# Patient Record
Sex: Male | Born: 1950 | Race: White | Hispanic: No | Marital: Married | Smoking: Former smoker
Health system: Southern US, Community
[De-identification: ages and names within clinical notes are randomized; demographics above are authoritative.]

## PROBLEM LIST (undated history)

## (undated) DIAGNOSIS — Z955 Presence of coronary angioplasty implant and graft: Secondary | ICD-10-CM

## (undated) DIAGNOSIS — Z87442 Personal history of urinary calculi: Secondary | ICD-10-CM

## (undated) DIAGNOSIS — Z973 Presence of spectacles and contact lenses: Secondary | ICD-10-CM

## (undated) DIAGNOSIS — N400 Enlarged prostate without lower urinary tract symptoms: Secondary | ICD-10-CM

## (undated) DIAGNOSIS — R350 Frequency of micturition: Secondary | ICD-10-CM

## (undated) DIAGNOSIS — R0600 Dyspnea, unspecified: Secondary | ICD-10-CM

## (undated) DIAGNOSIS — I10 Essential (primary) hypertension: Secondary | ICD-10-CM

## (undated) DIAGNOSIS — R351 Nocturia: Secondary | ICD-10-CM

## (undated) DIAGNOSIS — I255 Ischemic cardiomyopathy: Secondary | ICD-10-CM

## (undated) DIAGNOSIS — Z87448 Personal history of other diseases of urinary system: Secondary | ICD-10-CM

## (undated) DIAGNOSIS — I2510 Atherosclerotic heart disease of native coronary artery without angina pectoris: Secondary | ICD-10-CM

## (undated) DIAGNOSIS — J189 Pneumonia, unspecified organism: Secondary | ICD-10-CM

## (undated) DIAGNOSIS — K219 Gastro-esophageal reflux disease without esophagitis: Secondary | ICD-10-CM

## (undated) DIAGNOSIS — D649 Anemia, unspecified: Secondary | ICD-10-CM

## (undated) HISTORY — DX: Ischemic cardiomyopathy: I25.5

---
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
## 2000-02-24 ENCOUNTER — Encounter: Payer: Self-pay | Admitting: Emergency Medicine

## 2000-02-24 ENCOUNTER — Observation Stay (HOSPITAL_COMMUNITY): Admission: EM | Admit: 2000-02-24 | Discharge: 2000-02-24 | Payer: Self-pay | Admitting: Emergency Medicine

## 2000-02-24 HISTORY — PX: OTHER SURGICAL HISTORY: SHX169

## 2000-08-06 ENCOUNTER — Encounter: Payer: Self-pay | Admitting: Orthopedic Surgery

## 2000-08-06 ENCOUNTER — Ambulatory Visit (HOSPITAL_COMMUNITY): Admission: RE | Admit: 2000-08-06 | Discharge: 2000-08-06 | Payer: Self-pay | Admitting: Orthopedic Surgery

## 2000-09-19 HISTORY — PX: LUMBAR DISC SURGERY: SHX700

## 2000-09-23 ENCOUNTER — Encounter: Payer: Self-pay | Admitting: Internal Medicine

## 2000-09-23 DIAGNOSIS — K2100 Gastro-esophageal reflux disease with esophagitis, without bleeding: Secondary | ICD-10-CM | POA: Insufficient documentation

## 2000-09-23 DIAGNOSIS — K449 Diaphragmatic hernia without obstruction or gangrene: Secondary | ICD-10-CM | POA: Insufficient documentation

## 2000-09-23 DIAGNOSIS — K2900 Acute gastritis without bleeding: Secondary | ICD-10-CM | POA: Insufficient documentation

## 2000-09-23 DIAGNOSIS — K5730 Diverticulosis of large intestine without perforation or abscess without bleeding: Secondary | ICD-10-CM | POA: Insufficient documentation

## 2000-09-23 DIAGNOSIS — K219 Gastro-esophageal reflux disease without esophagitis: Secondary | ICD-10-CM | POA: Insufficient documentation

## 2000-09-23 DIAGNOSIS — K648 Other hemorrhoids: Secondary | ICD-10-CM | POA: Insufficient documentation

## 2000-09-23 DIAGNOSIS — K222 Esophageal obstruction: Secondary | ICD-10-CM | POA: Insufficient documentation

## 2000-09-23 DIAGNOSIS — K2980 Duodenitis without bleeding: Secondary | ICD-10-CM | POA: Insufficient documentation

## 2000-09-27 ENCOUNTER — Ambulatory Visit (HOSPITAL_COMMUNITY): Admission: RE | Admit: 2000-09-27 | Discharge: 2000-09-27 | Payer: Self-pay | Admitting: Internal Medicine

## 2000-09-27 ENCOUNTER — Encounter: Payer: Self-pay | Admitting: Internal Medicine

## 2000-09-29 ENCOUNTER — Ambulatory Visit (HOSPITAL_COMMUNITY): Admission: RE | Admit: 2000-09-29 | Discharge: 2000-09-30 | Payer: Self-pay | Admitting: Neurosurgery

## 2007-06-15 ENCOUNTER — Ambulatory Visit: Payer: Self-pay | Admitting: Internal Medicine

## 2008-01-05 DIAGNOSIS — Z87898 Personal history of other specified conditions: Secondary | ICD-10-CM | POA: Insufficient documentation

## 2009-02-15 ENCOUNTER — Ambulatory Visit (HOSPITAL_BASED_OUTPATIENT_CLINIC_OR_DEPARTMENT_OTHER): Admission: RE | Admit: 2009-02-15 | Discharge: 2009-02-15 | Payer: Self-pay | Admitting: Urology

## 2009-02-15 HISTORY — PX: OTHER SURGICAL HISTORY: SHX169

## 2009-02-27 ENCOUNTER — Emergency Department (HOSPITAL_COMMUNITY): Admission: EM | Admit: 2009-02-27 | Discharge: 2009-02-28 | Payer: Self-pay | Admitting: Emergency Medicine

## 2011-01-20 LAB — CULTURE, BLOOD (ROUTINE X 2): Culture: NO GROWTH

## 2011-01-20 LAB — DIFFERENTIAL
Basophils Absolute: 0 10*3/uL (ref 0.0–0.1)
Basophils Relative: 0 % (ref 0–1)
Eosinophils Relative: 1 % (ref 0–5)
Lymphocytes Relative: 7 % — ABNORMAL LOW (ref 12–46)
Monocytes Absolute: 0 10*3/uL — ABNORMAL LOW (ref 0.1–1.0)
Monocytes Relative: 1 % — ABNORMAL LOW (ref 3–12)
Neutro Abs: 4.8 10*3/uL (ref 1.7–7.7)

## 2011-01-20 LAB — URINALYSIS, ROUTINE W REFLEX MICROSCOPIC
Bilirubin Urine: NEGATIVE
Glucose, UA: NEGATIVE mg/dL
Nitrite: NEGATIVE
Protein, ur: 100 mg/dL — AB
Specific Gravity, Urine: 1.027 (ref 1.005–1.030)
Urobilinogen, UA: 0.2 mg/dL (ref 0.0–1.0)
pH: 5.5 (ref 5.0–8.0)

## 2011-01-20 LAB — BASIC METABOLIC PANEL
CO2: 23 mEq/L (ref 19–32)
Calcium: 9.1 mg/dL (ref 8.4–10.5)
GFR calc Af Amer: >60 mL/min (ref >60)
Glucose, Bld: 126 mg/dL — ABNORMAL HIGH (ref 70–99)
Potassium: 3.4 mEq/L — ABNORMAL LOW (ref 3.5–5.1)
Sodium: 139 mEq/L (ref 135–145)

## 2011-01-20 LAB — CBC
HCT: 40.6 % (ref 39.0–52.0)
Hemoglobin: 14.1 g/dL (ref 13.0–17.0)
MCHC: 34.8 g/dL (ref 30.0–36.0)
RBC: 4.37 MIL/uL (ref 4.22–5.81)
RDW: 12.1 % (ref 11.5–15.5)

## 2011-01-20 LAB — URINE MICROSCOPIC-ADD ON

## 2011-01-20 LAB — URINE CULTURE

## 2011-02-24 NOTE — Op Note (Signed)
NAMEBRANNON, Henry [name]                ACCOUNT NO.:  [id number]   MEDICAL RECORD NO.:  [id number]          PATIENT TYPE:  AMB   LOCATION:  [facility]                         FACILITY:  [facility]   PHYSICIAN:  Maretta Bees. Vonita Moss, M.D.DATE OF BIRTH:  20-Nov-1950   DATE OF PROCEDURE:  02/15/2009  DATE OF DISCHARGE:                               OPERATIVE REPORT   PREOPERATIVE DIAGNOSIS:  Large distal left ureteral stone and second  lower left ureteral stone.   POSTOPERATIVE DIAGNOSIS:  Left ureteral stone and large bladder stone.   PROCEDURE:  1. Cystoscopy.  2. Left ureteroscopy with laser and stone basketing.  3. Left retrograde pyelogram with interpretation.  4. Insertion of left double-J catheter.  5. Laser fragmentation of large bladder stone.   SURGEON:  Maretta Bees. Vonita Moss, M.D.   ANESTHESIA:  General.   INDICATIONS:  This 60 year old gentleman has had gross hematuria and  bladder outlet obstructive symptoms with nocturia and a weak stream.  A  CT scan was performed and it showed what appeared to be an 11 x 7 mm  stone in the distal left ureter and possibly a ureterocele and also 4 x  6 mm stone in the distal left ureter above that.  He is brought to the  OR today for cystoscopy to complete his hematuria workup and to remove  these ureteral stones.   PROCEDURE:  The patient was brought to the operating room and placed in  lithotomy position.  External genitalia were prepped and draped in usual  fashion.  He was cystoscoped.  The anterior urethra was normal.  He had  bilobar hypertrophy and a median lobe and the bladder had trabeculation.  The 7 x 11 mm stone was actually in the bladder at this point.  Fluoroscopically I could still see the 4 x 6 mm stone in the distal left  ureter.  I placed a Glidewire up the left ureter beyond the stone.  Over  the Glidewire I dilated the intramural ureter with the inner core of a  ureteral access sheath.  Through that inner core I inserted a metal  retractable core guidewire to the kidney.  I then used the 6-French  rigid ureteroscope and identified the stone in the distal ureter.  There  was a lot of edema and spasm where the stone had been.  The stone was  too big to remove intact so I used the holmium laser to fragment it into  four pieces, each of which were then removed with the stone basket.  There was no sign of ureteral perforation, but there was mucosal edema  and inflammation.  At this point I reinserted the cystoscope and using  the holmium laser fragment the large stone that was noted to be in the  bladder.  All stone fragments were then removed from the bladder.   I then reinserted the cystoscope and backloaded the guidewire and  performed a left retrograde pyelogram that showed moderate  pyelocaliectasis.  I then inserted a 6-French 26 cm double-J catheter  with a full coil in the upper calix of the left kidney and  a full coil  in the bladder.  The string was left on the double-J catheter and  brought out per  penis and taped to the dorsal aspect of the penis.  He was then taken to  recovery room in good condition having tolerated the procedure well.   I gave the stone fragments to his wife.  Blood loss was minimal.      Maretta Bees. Vonita Moss, M.D.  Electronically Signed     LJP/MEDQ  D:  02/15/2009  T:  02/15/2009  Job:  [id number]

## 2011-02-24 NOTE — Assessment & Plan Note (Signed)
[name]                         GASTROENTEROLOGY OFFICE NOTE   NAME:[name], [name]                       MRN:          [id number]  DATE:06/15/2007                            DOB:          03/23/1951    REASON FOR CONSULTATION:  1. Gastroesophageal reflux disease.  2. Dysphagia.  3. Rectal bleeding.   HISTORY:  This is a 60 year old white male with gastroesophageal reflux  disease complicated by erosive esophagitis and peptic stricture. He is  referred through the courtesy of Dr.  Doristine [name] regarding the above listed  issues. The patient was initially evaluated in this office September 20, 2000, for hemoccult positive stool and reflux disease with dysphagia.  See that dictation for details. He subsequently underwent colonoscopy  and upper endoscopy September 23, 2000. Colonoscopy was carried out to  the ascending colon due to a poor prep. Left-sided diverticulosis was  noted. Hemorrhoids were seen. It was recommended that he have an air  contrast barium enema. He failed to follow through with that  recommendation. Upper endoscopy revealed erosive esophagitis and a  peptic stricture as well as a hiatal hernia, gastritis and duodenitis.  Testing for Helicobacter pylori was negative. He was placed on a proton  pump inhibitor and recommended there is have followup endoscopy with  esophageal dilatation in 4-6 weeks. He failed to follow that  recommendation stating that he was feeling better. He has not been seen  since. Since that time, the patient has developed obstructive urinary  symptoms due to an enlarged prostate for which he is on Flomax. He has  also had back surgery. Otherwise, he has been well. The patient has been  maintained on Prilosec over-the-[name] daily for his reflux symptoms.  He uses this medication due to cost effectiveness. It had been working  well until recent weeks when he has noticed some breakthrough heartburn  as well as  recurrent dysphagia to solids such as meats. He saw Dr.  Doristine [name] and was placed on Zegerid. He has been on that for 3 or 4 weeks.  He is not certain that Prilosec did not work better. He has also noticed  some intermittent rectal bleeding as manifested by red blood on the  tissue particularly noticeable after passing a strained or constipated  stool. No abdominal pain, nausea or vomiting or weight loss.   PAST MEDICAL HISTORY:  1. Gastroesophageal reflux disease.  2. Benign prostatic hypertrophy.  3. Status post right hand surgery.  4. Status post low back surgery.   ALLERGIES:  No known drug allergies.   CURRENT MEDICATIONS:  1. Flomax 0.4 mg two each evening.  2. Zegerid 40 mg at night.   FAMILY HISTORY:  No family history of gastrointestinal malignancy.   SOCIAL HISTORY:  The patient is married with two children and lives with  his wife. Has a GED. He is a Psychiatrist. Does not smoke or use alcohol.   REVIEW OF SYSTEMS:  Per diagnostic evaluation form.   PHYSICAL EXAMINATION:  Well-appearing male in no acute distress. Blood  pressure is 120/82, heart rate 80, weight is 212.8 pounds. He is  5 feet,  11 inches in height.  HEENT: Sclerae anicteric. Conjunctivae are pink. Oral mucosa intact. No  adenopathy.  LUNGS:  Are clear.  HEART: Is regular.  ABDOMEN: Soft without tenderness, mass or hernia. Good bowel sounds  heard.  EXTREMITIES: Are without edema.   LABORATORY DATA:  CBC and comprehensive metabolic panel obtained May 03, 2007, were unremarkable.   IMPRESSION:  1. Gastroesophageal reflux disease with a history of erosive      esophagitis, currently with breakthrough symptoms on Zegerid.  2. Recurrent dysphagia likely due to known peptic stricture.  3. Minor intermittent rectal bleeding. Previous colonoscopy in 2001      with poor prep.   RECOMMENDATIONS:  1. Change to Protonix 40 mg daily.  2. Schedule upper endoscopy with possible esophageal dilation. The       nature of the procedure as well as the risks, benefits and      alternatives have been reviewed. He understood and agreed to      proceed.  3. Schedule colonoscopy with polypectomy if necessary. The nature of      the procedure as well as the risks, benefits and alternatives have      been reviewed. He understood and agreed to proceed.  4. Ongoing general medical care with Dr.  Doristine [name].     Henry [name]. Henry Goodell, MD  Electronically Signed    JNP/MedQ  DD: 06/15/2007  DT: 06/15/2007  Job #: [id number]   cc:   Henry [name], M.D.

## 2011-02-27 NOTE — H&P (Signed)
[place]. [facility]  Patient:    Henry [name], Henry [name]                       MRN: [id number] Adm. Date:  [id number] Attending:  Tressie Stalker D                         History and Physical  CHIEF COMPLAINT:  Left leg pain.  HISTORY OF PRESENT ILLNESS:  The patient is a 60 year old white male who was in his usual state of good health until approximately four months ago.  It was at that time that he began having some low back pain which went on to become severe left leg pain.  He was treated with multiple medications, and failed to improve.  He was subsequently worked up with a lumbar MRI, demonstrating a herniated disk at L4-5 on the left.  The patients signs and symptoms and physical examination were consistent with a left L5 radiculopathy.  He therefore weighed the risks, benefits, and alternatives, and decided to proceed with a microdiskectomy.  PAST MEDICAL HISTORY: 1. Positive for benign prostatic hypertrophy. 2. 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  CURRENT MEDICATIONS: 1. Flomax 8 mg p.o. q.d. 2. Vioxx one p.o. q.d. 3. Tylenol p.r.n. 4. Ibuprofen.  ALLERGIES:  No known drug allergies.  FAMILY HISTORY:  Both of the patients are age 60.  His mother has hypertension.  He has had three back surgeries.  His father has prostate trouble, with prior back surgeries.  SOCIAL HISTORY:  The patient is married.  He has two children.  He lives in [place].  He is self-employed as a Veterinary surgeon.  He denies tobacco, ethanol, or drug use.  REVIEW OF SYSTEMS:  Negative except as above.  PHYSICAL EXAMINATION:  GENERAL:  A pleasant 60 year old white male, who walks with an antalgic gait, complaining of left leg pain.  His height is 6 feet, weight 213 pounds.  HEENT:  Normal.  NECK:  Normal.  THORAX:  Symmetric.  LUNGS:  Clear to auscultation.  HEART:  A regular  rate and rhythm.  ABDOMEN:  Soft, nontender.  EXTREMITIES:  Normal except for the amputation of his right first DIP joint.  BACK:  There is no point tenderness or deformity.  Straight leg raising testing is positive on the left, negative on the left.  Fabere testing negative bilaterally.  NEUROLOGIC:  The patient is alert and oriented x 3.  Cranial nerves II-XII are grossly intact bilaterally.  Vision and hearing grossly normal bilaterally. Motor strength 5/5 in the bilateral deltoid, biceps, triceps, hand grip, wrist extensor, interosseous, psoas, quadriceps, gastrocnemius, extensor hallucis longus.  Cerebellar exam is intact to rapid alternating movements of the upper extremities bilaterally.  Deep tendon reflexes 2/4 in the bilateral biceps, triceps, brachial radialis, quadriceps, gastrocnemius.  He has bilateral flexor plantar reflexes.  No ankle clonus.  Sensory examination normal to light touch and pinprick sensation in all tested dermatomes bilaterally.  IMAGING STUDIES:  The patient had a lumbar MRI performed at [facility] on August 10, 2000, which demonstrates a left-sided herniated nucleus pulposus at L4-5, with compressive left L5 nerve root, and some mild degenerative disease at L5-S1.  ASSESSMENT/PLAN:  L4-5 herniated nucleus pulposus, with degenerative disease, and spinal stenosis, with lumbar radiculopathy.  I have discussed this situation with the patient and with  the patients wife, and have reviewed the MRI scan with them, and pointed out the abnormalities. His signs and symptoms and physical examination are consistent with a left L5 radiculopathy.  Therefore I have discussed the various treatment options with him, including continuing management, and surgery.  I have described the procedure of a left L4-5 microdiskectomy, and have shown him surgical models, and discussed the risks of surgery with him.  The patient has weighed the risks,  benefits, and alternatives to surgery, and wants to proceed with a microdiskectomy on September 29, 2000. DD:  09/29/00 TD:  09/29/00 Job: [id number]

## 2011-02-27 NOTE — Op Note (Signed)
[place]. [facility]  Patient:    Henry [name], Henry [name]                       MRN: [id number] Proc. Date: 09/29/00 Adm. Date:  [id number] Attending:  Tressie Stalker D                           Operative Report  PREOPERATIVE DIAGNOSIS:  Left L4-5 herniated nucleus pulposus, degenerative disk disease, spinal stenosis, lumbar radiculopathy.  POSTOPERATIVE DIAGNOSIS:  Left L4-5 herniated nucleus pulposus, degenerative disk disease, spinal stenosis, lumbar radiculopathy.  OPERATION PERFORMED:  Left L4-5 microdiskectomy using microdissection.  SURGEON:  Cristi Loron, M.D.  ASSISTANT:  Dr. Shirlean Kelly.  ANESTHESIA:  General endotracheal.  ESTIMATED BLOOD LOSS:  100 cc.  SPECIMENS:  None.  DRAINS:  None.  COMPLICATIONS:  None.  INDICATIONS FOR PROCEDURE:  The patient is a 60 year old white male who suffered from back and left leg pain.  He failed medical management and was worked up as an outpatient with a lumbar MRI demonstrated a herniated nucleus pulposus at L4-5 on the left.  His signs, symptoms and physical exam were consistent with a left L5 radiculopathy.  I therefore discussed various treatment options with him including surgery.  He weighed the risks, benefits and alternatives of surgery and decided to proceed with the operation.  DESCRIPTION OF PROCEDURE:  The patient was brought to the operating room by the anesthesia team.  General endotracheal anesthesia was induced. He was then turned to the prone position on the McGregor frame.  His lumbosacral region was then shaved and prepared with Betadine soap and Betadine solution and sterile drapes were applied.  I then injected the areas to be incised with Marcaine with epinephrine solution and used a scalpel to make a midline vertical incision over the L4-5 interspace.  I used electrocautery to dissect down to the thoracolumbar fascia and divided it just to the left of midline performing a  left-sided periosteal dissection.  I stripped the paraspinous musculature from the left spinous process of the lamina of L4 and L5.  I obtained intraoperative radiograph to confirm my location.  I then inserted the [facility] retractor for exposure.  I brought the operating microscope into the field and under its magnification and illumination, I completed the microdissection/decompression.  I used the high speed drill to perform a left L4 laminotomy.  I widened the laminotomy with the Kerrison punch removing the left L4-5 ligamentum flavum, identified the thecal sac and the L5 nerve root. I performed a foraminotomy about the L5 nerve root.  I then freed it up from the epidural tissue and then carefully retracted it medially with the DErrico retractor.  This exposed a moderate sized herniated disk which had migrated caudally towards the neural foramen just ventral to the L5 nerve roots path. I then freed it up with the [facility] 4 and removed it in several fragments with the pituitary forceps.  There was a fairly large hole in the annulus fibrosis and therefore I incised the annulus with a 15 blade scalpel and performed a partial diskectomy in the intervertebral disk space using pituitary forceps and Epstein curets.  I then copiously irrigated the wound with bacitracin solution.  I removed the solution. I palpated about the ventral surface of the thecal sac and the L5 nerve root and noted it was well decompressed throughout the neural foramen.  I used the osteophyte tool  to remove some spondylosis from the vertebral end plates at U0-4.  I then achieved stringent hemostasis with bipolar electrocautery.  I removed the McCullough retractor and reapproximated the patients thoracolumbar fascia with interrupted #1 Vicryl and subcutaneous tissues with interrupted 2-0 Vicryl, the skin with Steri-Strips and benzoin.  The wound was then coated with bacitracin ointment.  Sterile dressing applied.  The  drapes were removed and the patient was subsequently returned to supine position where he was extubated by anesthesia team and transported to the post anesthesia care unit in stable condition.  Sponge, needle and instrument counts were correct at the end of the case. DD:  09/29/00 TD:  09/29/00 Job: [id number]

## 2011-02-27 NOTE — Op Note (Signed)
[place]. [facility]  Patient:    Henry [name], Henry [name]                      MRN: [id number] Proc. Date: 02/24/00 Attending:  Earvin Hansen L. Shon Hough, M.D. CC:         Yaakov Guthrie. Shon Hough, M.D. (2 copies)                           Operative Report  INDICATIONS:  This is a 60 year old gentleman who was at work today and sustained severe lacerations and mangling of his right hand over the ulnar aspect.  The has devitalization and ripping away of tissue from his wrist area up to the small finger, mid phalanx.  X-rays reveal a small ______ fracture of the MP joint area.  On examination preoperatively, the patient demonstrated hypesthesia of the small finger on the ulnar aspect.  He is able to flex his finger and move the superficialis and profundus tendon, and is able to adequately extend the finger.  He is not having any power or agility to manipulate the hypothenar eminence as well.  OPERATION PERFORMED:  Exploration, repair of soft tissue structures.  ATTENDING SURGEON:  Yaakov Guthrie. Shon Hough, M.D.  ANESTHESIA:  General with tourniquet.  Soft tissues are repaired and thenar eminence musculature, which was slit through-and-through longitudinally.  Repair of the capsule of the MP joint and collateral ligaments.  Irrigation.  Microscopic repair of the digital nerve. Repair of severe laceration of the hand after debridement, and flap advancement, and full-thickness skin graft coverage.  DESCRIPTION OF PROCEDURE:  After the patient was given general anesthesia orally, prep was done to the right hand and arm areas as well as the abdomen and thigh areas in preparation for flaps or grafts.  Using Betadine soap and solution, walled off with the sterile towels and drapes so as to make a sterile field, the hand was explored.  Irrigation was done with copious amounts of saline as well as bug juice.  Examination reveals through-and-through laceration longitudinal of a thenar  eminence musculature times about three ______ lacerations.  Examination reveals also through-and-through tear of the collateral ligaments and capsule of the MP joint area with exposed capsule and joint.  Through-and-through laceration of digital nerve, severe laceration jagged of the hypothenar eminence and wrist up to the small finger distal joint.  Debridement was done to the edges of the skin.  Irrigation was done with copious amounts of saline.  Next, the muscle was repaired back in its normal anatomical location with multiple sutures of 3-0 Vicryl, tied with approximately 15 sutures.  The capsule was then repaired after parts of it were dissected out and repaired back with 3-0 Vicryl.  After this, the neurovascular structures are examined showing the tendons to be intact both on the flexor and extensor side.  The digital nerve was isolated both the proximal and distal ends, and then epineural repair was done microscopically 8-0 Prolene.  The wound was irrigated and soft tissue was placed over the digital nerve repair.  Next, the [facility] was used to dissect under skin and dermis to allow the flap to advance over the hypothenar eminence.  Full-thickness skin graft, however, was taken for the lateral part of the MP to the digital area of the skin loss.  Full-thickness skin graft taken from the right groin.  This was then repaired back with 3-0 Vicryl subcu and a running  subcuticular stitch.  The graft was defatted, placed over the defect and secured with 4-0 Prolene sutures.  ______ the suture and left ______ dressing using Xeroform, 2 x 2s, 4 x 4s, ______ dressing, Ace wrap. ______ Steri-Strips and soft dressings.  He withstood the procedures very well and was taken to recovery in excellent condition.  Estimated blood loss: Nil.  ______ tourniquet was used.  He will be discharged today to be seen back in my office in a couple of days for re-examination and dressing change.  He will  be placed on antibiotic therapy as well as analgesia.  He is to call with any medical problems. DD:  02/24/00 TD:  02/27/00 Job: [id number]

## 2013-07-13 ENCOUNTER — Other Ambulatory Visit: Payer: Self-pay | Admitting: Urology

## 2013-09-27 ENCOUNTER — Encounter (HOSPITAL_BASED_OUTPATIENT_CLINIC_OR_DEPARTMENT_OTHER): Payer: Self-pay | Admitting: *Deleted

## 2013-09-28 ENCOUNTER — Encounter (HOSPITAL_BASED_OUTPATIENT_CLINIC_OR_DEPARTMENT_OTHER): Payer: Self-pay | Admitting: *Deleted

## 2013-09-28 NOTE — Progress Notes (Signed)
NPO AFTER MN. ARRIVE AT 0930. NEEDS HG.  PT AWARE OWER AT MAIN.

## 2013-09-29 ENCOUNTER — Ambulatory Visit (HOSPITAL_BASED_OUTPATIENT_CLINIC_OR_DEPARTMENT_OTHER): Payer: BC Managed Care – PPO | Admitting: Anesthesiology

## 2013-09-29 ENCOUNTER — Encounter (HOSPITAL_BASED_OUTPATIENT_CLINIC_OR_DEPARTMENT_OTHER): Payer: Self-pay

## 2013-09-29 ENCOUNTER — Encounter (HOSPITAL_COMMUNITY)
Admission: RE | Disposition: A | Discharge status: Home / Self Care | Payer: Self-pay | Source: Ambulatory Visit | Attending: Urology

## 2013-09-29 ENCOUNTER — Encounter (HOSPITAL_BASED_OUTPATIENT_CLINIC_OR_DEPARTMENT_OTHER): Payer: BC Managed Care – PPO | Admitting: Anesthesiology

## 2013-09-29 ENCOUNTER — Observation Stay (HOSPITAL_BASED_OUTPATIENT_CLINIC_OR_DEPARTMENT_OTHER)
Admission: RE | Admit: 2013-09-29 | Discharge: 2013-09-30 | Disposition: A | Discharge status: Home / Self Care | Payer: BC Managed Care – PPO | Source: Ambulatory Visit | Attending: Urology | Admitting: Urology

## 2013-09-29 DIAGNOSIS — N210 Calculus in bladder: Secondary | ICD-10-CM | POA: Insufficient documentation

## 2013-09-29 DIAGNOSIS — Z87442 Personal history of urinary calculi: Secondary | ICD-10-CM | POA: Diagnosis not present

## 2013-09-29 DIAGNOSIS — Z87891 Personal history of nicotine dependence: Secondary | ICD-10-CM | POA: Diagnosis not present

## 2013-09-29 DIAGNOSIS — K219 Gastro-esophageal reflux disease without esophagitis: Secondary | ICD-10-CM | POA: Diagnosis not present

## 2013-09-29 DIAGNOSIS — Z79899 Other long term (current) drug therapy: Secondary | ICD-10-CM | POA: Diagnosis not present

## 2013-09-29 DIAGNOSIS — N138 Other obstructive and reflux uropathy: Secondary | ICD-10-CM | POA: Diagnosis present

## 2013-09-29 DIAGNOSIS — R232 Flushing: Secondary | ICD-10-CM | POA: Insufficient documentation

## 2013-09-29 DIAGNOSIS — N401 Enlarged prostate with lower urinary tract symptoms: Principal | ICD-10-CM | POA: Diagnosis present

## 2013-09-29 DIAGNOSIS — N320 Bladder-neck obstruction: Secondary | ICD-10-CM | POA: Diagnosis not present

## 2013-09-29 DIAGNOSIS — R300 Dysuria: Secondary | ICD-10-CM | POA: Diagnosis not present

## 2013-09-29 HISTORY — DX: Benign prostatic hyperplasia without lower urinary tract symptoms: N40.0

## 2013-09-29 HISTORY — PX: CYSTOSCOPY WITH LITHOLAPAXY: SHX1425

## 2013-09-29 HISTORY — DX: Personal history of other diseases of urinary system: Z87.448

## 2013-09-29 HISTORY — PX: TRANSURETHRAL RESECTION OF PROSTATE: SHX73

## 2013-09-29 HISTORY — DX: Frequency of micturition: R35.0

## 2013-09-29 HISTORY — DX: Personal history of urinary calculi: Z87.442

## 2013-09-29 HISTORY — DX: Gastro-esophageal reflux disease without esophagitis: K21.9

## 2013-09-29 HISTORY — DX: Nocturia: R35.1

## 2013-09-29 HISTORY — DX: Presence of spectacles and contact lenses: Z97.3

## 2013-09-29 LAB — BASIC METABOLIC PANEL
BUN: 13 mg/dL (ref 6–23)
CO2: 24 mEq/L (ref 19–32)
Chloride: 103 mEq/L (ref 96–112)
GFR calc Af Amer: >90 mL/min (ref >90)
GFR calc non Af Amer: >90 mL/min (ref >90)
Potassium: 3.4 mEq/L — ABNORMAL LOW (ref 3.5–5.1)
Sodium: 137 mEq/L (ref 135–145)

## 2013-09-29 LAB — POCT HEMOGLOBIN-HEMACUE: Hemoglobin: 14.5 g/dL (ref 13.0–17.0)

## 2013-09-29 SURGERY — TRANSURETHRAL RESECTION OF THE PROSTATE WITH GYRUS INSTRUMENTS
Anesthesia: General | Site: Prostate

## 2013-09-29 MED ORDER — FENTANYL CITRATE 0.05 MG/ML IJ SOLN
INTRAMUSCULAR | Status: AC
  Filled 2013-09-29: qty 4

## 2013-09-29 MED ORDER — STERILE WATER FOR IRRIGATION IR SOLN
Status: DC | PRN
  Administered 2013-09-29: 3000 mL

## 2013-09-29 MED ORDER — CIPROFLOXACIN IN D5W 400 MG/200ML IV SOLN
400.0000 mg | INTRAVENOUS | Status: AC
Start: 2013-09-29 — End: 2013-09-29
  Administered 2013-09-29: 400 mg via INTRAVENOUS
  Filled 2013-09-29: qty 200

## 2013-09-29 MED ORDER — PROPOFOL 10 MG/ML IV BOLUS
INTRAVENOUS | Status: DC | PRN
  Administered 2013-09-29: 200 mg via INTRAVENOUS

## 2013-09-29 MED ORDER — SODIUM CHLORIDE 0.45 % IV SOLN
INTRAVENOUS | Status: DC
  Administered 2013-09-29 – 2013-09-30 (×2): via INTRAVENOUS

## 2013-09-29 MED ORDER — FENTANYL CITRATE 0.05 MG/ML IJ SOLN
INTRAMUSCULAR | Status: DC | PRN
  Administered 2013-09-29 (×2): 50 ug via INTRAVENOUS

## 2013-09-29 MED ORDER — ONDANSETRON HCL 4 MG/2ML IJ SOLN
INTRAMUSCULAR | Status: DC | PRN
  Administered 2013-09-29: 4 mg via INTRAVENOUS

## 2013-09-29 MED ORDER — DOCUSATE SODIUM 100 MG PO CAPS
100.0000 mg | ORAL_CAPSULE | Freq: Two times a day (BID) | ORAL | Status: DC
  Administered 2013-09-29 – 2013-09-30 (×2): 100 mg via ORAL
  Filled 2013-09-29 (×3): qty 1

## 2013-09-29 MED ORDER — CIPROFLOXACIN HCL 250 MG PO TABS: 250.0000 mg | ORAL_TABLET | Freq: Two times a day (BID) | ORAL | Status: DC

## 2013-09-29 MED ORDER — BELLADONNA ALKALOIDS-OPIUM 16.2-60 MG RE SUPP
RECTAL | Status: AC
  Filled 2013-09-29: qty 1

## 2013-09-29 MED ORDER — FENTANYL CITRATE 0.05 MG/ML IJ SOLN
INTRAMUSCULAR | Status: AC
  Filled 2013-09-29: qty 2

## 2013-09-29 MED ORDER — BELLADONNA ALKALOIDS-OPIUM 16.2-60 MG RE SUPP
RECTAL | Status: DC | PRN
  Administered 2013-09-29: 1 via RECTAL

## 2013-09-29 MED ORDER — MIDAZOLAM HCL 5 MG/5ML IJ SOLN
INTRAMUSCULAR | Status: DC | PRN
  Administered 2013-09-29: 2 mg via INTRAVENOUS

## 2013-09-29 MED ORDER — SODIUM CHLORIDE 0.9 % IR SOLN
Status: DC | PRN
  Administered 2013-09-29: 11000 mL via INTRAVESICAL

## 2013-09-29 MED ORDER — DEXAMETHASONE SODIUM PHOSPHATE 4 MG/ML IJ SOLN
INTRAMUSCULAR | Status: DC | PRN
  Administered 2013-09-29: 10 mg via INTRAVENOUS

## 2013-09-29 MED ORDER — ZOLPIDEM TARTRATE 5 MG PO TABS: 5.0000 mg | ORAL_TABLET | Freq: Every evening | ORAL | Status: DC | PRN

## 2013-09-29 MED ORDER — LACTATED RINGERS IV SOLN
INTRAVENOUS | Status: DC
  Administered 2013-09-29 (×2): via INTRAVENOUS
  Filled 2013-09-29: qty 1000

## 2013-09-29 MED ORDER — HYDROCODONE-ACETAMINOPHEN 5-325 MG PO TABS: 1.0000 | ORAL_TABLET | ORAL | Status: DC | PRN

## 2013-09-29 MED ORDER — CIPROFLOXACIN HCL 250 MG PO TABS
250.0000 mg | ORAL_TABLET | Freq: Two times a day (BID) | ORAL | Status: AC
Start: 2013-09-29 — End: 2013-09-29
  Administered 2013-09-29: 250 mg via ORAL
  Filled 2013-09-29: qty 1

## 2013-09-29 MED ORDER — LIDOCAINE HCL (CARDIAC) 20 MG/ML IV SOLN
INTRAVENOUS | Status: DC | PRN
  Administered 2013-09-29: 80 mg via INTRAVENOUS

## 2013-09-29 MED ORDER — FENTANYL CITRATE 0.05 MG/ML IJ SOLN
25.0000 ug | INTRAMUSCULAR | Status: DC | PRN
  Administered 2013-09-29: 50 ug via INTRAVENOUS
  Administered 2013-09-29: 25 ug via INTRAVENOUS
  Administered 2013-09-29: 50 ug via INTRAVENOUS
  Filled 2013-09-29: qty 1

## 2013-09-29 MED ORDER — CIPROFLOXACIN IN D5W 400 MG/200ML IV SOLN
INTRAVENOUS | Status: AC
  Filled 2013-09-29: qty 200

## 2013-09-29 MED ORDER — BELLADONNA ALKALOIDS-OPIUM 16.2-60 MG RE SUPP: 1.0000 | Freq: Four times a day (QID) | RECTAL | Status: DC | PRN

## 2013-09-29 MED ORDER — MEPERIDINE HCL 25 MG/ML IJ SOLN
6.2500 mg | INTRAMUSCULAR | Status: DC | PRN
  Filled 2013-09-29: qty 1

## 2013-09-29 MED ORDER — MIDAZOLAM HCL 2 MG/2ML IJ SOLN
INTRAMUSCULAR | Status: AC
  Filled 2013-09-29: qty 2

## 2013-09-29 MED ORDER — LACTATED RINGERS IV SOLN
INTRAVENOUS | Status: DC
  Administered 2013-09-29: 10:00:00 via INTRAVENOUS
  Filled 2013-09-29: qty 1000

## 2013-09-29 MED ORDER — PROMETHAZINE HCL 25 MG/ML IJ SOLN
6.2500 mg | INTRAMUSCULAR | Status: DC | PRN
  Filled 2013-09-29: qty 1

## 2013-09-29 MED ORDER — ONDANSETRON HCL 4 MG/2ML IJ SOLN: 4.0000 mg | INTRAMUSCULAR | Status: DC | PRN

## 2013-09-29 SURGICAL SUPPLY — 44 items
BAG DRAIN URO-CYSTO SKYTR STRL (DRAIN) ×3 IMPLANT
BAG URINE DRAINAGE (UROLOGICAL SUPPLIES) ×3 IMPLANT
BAG URINE LEG 19OZ MD ST LTX (BAG) IMPLANT
CANISTER SUCT LVC 12 LTR MEDI- (MISCELLANEOUS) ×6 IMPLANT
CARTRIDGE STONEBREAK CO2 KIDNE (ELECTROSURGICAL) ×15 IMPLANT
CATH FOLEY 2WAY SLVR  5CC 20FR (CATHETERS)
CATH FOLEY 2WAY SLVR  5CC 22FR (CATHETERS)
CATH FOLEY 2WAY SLVR 30CC 22FR (CATHETERS) IMPLANT
CATH FOLEY 2WAY SLVR 5CC 20FR (CATHETERS) IMPLANT
CATH FOLEY 2WAY SLVR 5CC 22FR (CATHETERS) IMPLANT
CATH FOLEY 3WAY 30CC 22F (CATHETERS) ×3 IMPLANT
CATH HEMA 3WAY 30CC 24FR COUDE (CATHETERS) IMPLANT
CATH HEMA 3WAY 30CC 24FR RND (CATHETERS) IMPLANT
CLOTH BEACON ORANGE TIMEOUT ST (SAFETY) ×3 IMPLANT
DRAPE CAMERA CLOSED 9X96 (DRAPES) ×3 IMPLANT
ELECT BUTTON HF 24-28F 2 30DE (ELECTRODE) ×3 IMPLANT
ELECT LOOP MED HF 24F 12D CBL (CLIP) ×3 IMPLANT
ELECT REM PT RETURN 9FT ADLT (ELECTROSURGICAL) ×3
ELECTRODE REM PT RTRN 9FT ADLT (ELECTROSURGICAL) ×2 IMPLANT
EVACUATOR MICROVAS BLADDER (UROLOGICAL SUPPLIES) ×3 IMPLANT
GLOVE BIO SURGEON STRL SZ 6.5 (GLOVE) ×6 IMPLANT
GLOVE BIO SURGEON STRL SZ8 (GLOVE) ×3 IMPLANT
GLOVE BIOGEL PI IND STRL 6.5 (GLOVE) ×2 IMPLANT
GLOVE BIOGEL PI INDICATOR 6.5 (GLOVE) ×1
GOWN PREVENTION PLUS LG XLONG (DISPOSABLE) ×3 IMPLANT
GOWN STRL REIN XL XLG (GOWN DISPOSABLE) ×3 IMPLANT
HOLDER FOLEY CATH W/STRAP (MISCELLANEOUS) ×3 IMPLANT
IV NS 1000ML (IV SOLUTION) ×5
IV NS 1000ML BAXH (IV SOLUTION) ×10 IMPLANT
IV NS IRRIG 3000ML ARTHROMATIC (IV SOLUTION) ×6 IMPLANT
KIT ASPIRATION TUBING (SET/KITS/TRAYS/PACK) IMPLANT
KIT BALLIN UROMAX 15FX10 (LABEL) IMPLANT
KIT BALLN UROMAX 15FX4 (MISCELLANEOUS) IMPLANT
KIT BALLN UROMAX 26 75X4 (MISCELLANEOUS)
PACK CYSTOSCOPY (CUSTOM PROCEDURE TRAY) ×3 IMPLANT
PLUG CATH AND CAP STER (CATHETERS) IMPLANT
PROBE PNEUMATIC 1.6MM (ELECTROSURGICAL) ×3 IMPLANT
SET ASPIRATION TUBING (TUBING) ×3 IMPLANT
SET HIGH PRES BAL DIL (LABEL)
SHEATH URET ACCESS 12FR/35CM (UROLOGICAL SUPPLIES) IMPLANT
SHEATH URET ACCESS 12FR/55CM (UROLOGICAL SUPPLIES) IMPLANT
SYR 30ML LL (SYRINGE) ×3 IMPLANT
SYRINGE IRR TOOMEY STRL 70CC (SYRINGE) IMPLANT
WATER STERILE IRR 500ML POUR (IV SOLUTION) IMPLANT

## 2013-09-29 NOTE — H&P (Signed)
  Urology History and Physical Exam  CC: Prostatic enlargement, bladder stone  HPI: 62 year old male presents for a TURP and cystolithalopaxy as treatment for obstructive BPH and a 22 mm bladder calculus. He has been on maximal medical therapy and despite that has significant obstructive symptomatology, with an IPSS of 27.  PMH: Past Medical History  Diagnosis Date  . History of kidney stones   . History of bladder stone   . BPH (benign prostatic hypertrophy)   . GERD (gastroesophageal reflux disease)     occasionally uses tums or 1 tbsp vinager  . Nocturia   . Frequency of urination   . Wears glasses     PSH: Past Surgical History  Procedure Laterality Date  . Repair right hand soft tissue structures  02-24-2000    WORK INJURY /  PARTIAL AMPUTATION RIGHT INDEX FINGER  . Lumbar disc surgery  09-19-2000    LEFT  L4 -- L5  . Cysto/  left ureteroscopic stone extraction/  bladder  stone extraction  02-15-2009    Allergies: No Known Allergies  Medications: No prescriptions prior to admission     Social History: History   Social History  . Marital Status: Married    Spouse Name: N/A    Number of Children: N/A  . Years of Education: N/A   Occupational History  . Not on file.   Social History Main Topics  . Smoking status: Former Smoker -- 1.00 packs/day for 20 years    Types: Cigarettes    Quit date: 09/29/1983  . Smokeless tobacco: Never Used  . Alcohol Use: Yes     Comment: rare  . Drug Use: No  . Sexual Activity: Not on file   Other Topics Concern  . Not on file   Social History Narrative  . No narrative on file    Family History: History reviewed. No pertinent family history.  Review of Systems: Positive: Frequency, urgency, decreased FOS, nocturia, intermittency, hesitancy, hematuria, ED. Negative:   A further 10 point review of systems was negative except what is listed in the HPI.  Physical Exam: @VITALS2 @ General: No acute distress.   Awake. Head:  Normocephalic.  Atraumatic. ENT:  EOMI.  Mucous membranes moist Neck:  Supple.  No lymphadenopathy. CV:  S1 present. S2 present. Regular rate. Pulmonary: Equal effort bilaterally.  Clear to auscultation bilaterally. Abdomen: Soft.  Non tender to palpation. Skin:  Normal turgor.  No visible rash. Extremity: No gross deformity of bilateral upper extremities.  No gross deformity of    bilateral lower extremities. Neurologic: Alert. Appropriate mood.    Studies:  No results found for this basename: HGB, WBC, PLT,  in the last 72 hours  No results found for this basename: NA, K, CL, CO2, BUN, CREATININE, CALCIUM, MAGNESIUM, GFRNONAA, GFRAA,  in the last 72 hours   No results found for this basename: PT, INR, APTT,  in the last 72 hours   No components found with this basename: ABG,     Assessment:  BPH, bladder stone  Plan: TUR-P, cystolithalopaxy

## 2013-09-29 NOTE — Anesthesia Procedure Notes (Signed)
Procedure Name: LMA Insertion Date/Time: 09/29/2013 11:25 AM Performed by: Renella Cunas D Pre-anesthesia Checklist: Patient identified, Emergency Drugs available, Suction available and Patient being monitored Patient Re-evaluated:Patient Re-evaluated prior to inductionOxygen Delivery Method: Circle System Utilized Preoxygenation: Pre-oxygenation with 100% oxygen Intubation Type: IV induction Ventilation: Mask ventilation without difficulty LMA: LMA inserted LMA Size: 5.0 Number of attempts: 1 Airway Equipment and Method: bite block Placement Confirmation: positive ETCO2 Tube secured with: Tape Dental Injury: Teeth and Oropharynx as per pre-operative assessment

## 2013-09-29 NOTE — Anesthesia Preprocedure Evaluation (Signed)
Anesthesia Evaluation  Patient identified by MRN, date of birth, ID band Patient awake    Reviewed: Allergy & Precautions, H&P , NPO status , Patient's Chart, lab work & pertinent test results  Airway Mallampati: II TM Distance: >3 FB Neck ROM: Full    Dental no notable dental hx.    Pulmonary neg pulmonary ROS, former smoker,  breath sounds clear to auscultation  Pulmonary exam normal       Cardiovascular negative cardio ROS  Rhythm:Regular Rate:Normal     Neuro/Psych negative neurological ROS  negative psych ROS   GI/Hepatic negative GI ROS, Neg liver ROS, GERD-  Controlled,  Endo/Other  negative endocrine ROS  Renal/GU negative Renal ROS  negative genitourinary   Musculoskeletal negative musculoskeletal ROS (+)   Abdominal   Peds negative pediatric ROS (+)  Hematology negative hematology ROS (+)   Anesthesia Other Findings   Reproductive/Obstetrics negative OB ROS                           Anesthesia Physical Anesthesia Plan  ASA: II  Anesthesia Plan: General   Post-op Pain Management:    Induction: Intravenous  Airway Management Planned: LMA  Additional Equipment:   Intra-op Plan:   Post-operative Plan: Extubation in OR  Informed Consent: I have reviewed the patients History and Physical, chart, labs and discussed the procedure including the risks, benefits and alternatives for the proposed anesthesia with the patient or authorized representative who has indicated his/her understanding and acceptance.   Dental advisory given  Plan Discussed with: CRNA  Anesthesia Plan Comments:         Anesthesia Quick Evaluation

## 2013-09-29 NOTE — Op Note (Signed)
Preoperative diagnosis: 1. Bladder outlet obstruction secondary to BPH 2. 22 mm bladder stone Postoperative diagnosis:  1. Bladder outlet obstruction secondary to BPH 2. 22 mm bladder stone Procedure:  1. Cystoscopy 2. Transurethral resection of the prostate 3. Cystolithalopaxy  Surgeon: Bertram Millard. Henry [name], M.D.  Anesthesia: General  Complications: None  Drain: Foley catheter  EBL: Minimal  Specimens: 1. Prostate chips to path 2. Bladder stones to family  Disposition of specimens: Pathology  Indication: Henry [name] is a patient with bladder outlet obstruction secondary to benign prostatic hyperplasia. He also has a 22 mm bladder stone. After reviewing the management options for treatment, he elected to proceed with the above surgical procedure(s). We have discussed the potential benefits and risks of the procedure, side effects of the proposed treatment, the likelihood of the patient achieving the goals of the procedure, and any potential problems that might occur during the procedure or recuperation. Informed consent has been obtained.  Description of procedure:  The patient was identified in the holding area.He received preoperative antibiotics. He was then taken to the operating room. General anesthetic was administered.  The patient was then placed in the dorsal lithotomy position, prepped and draped in the usual sterile fashion. Timeout was then performed.  A 22 French panendoscope was passed through his urethra and into his bladder. There was no significant obstruction from the lateral lobes. However, there was a significant median bar with obvious obstruction. The bladder was entered and inspected circumferentially. There were 2+ trabeculations scattered throughout. There were no mucosal lesions. Ureteral orifices were normal in configuration and location. The previously mentioned large bladder stone was identified. I then passed the Stonebreaker through the cystoscope,  and using the pneumatic device, fragmented the large stone into multiple smaller fragments. Approximately 6 CO2 cartridges were used for this fragmentation. Following fragmentation, I passed the resectoscope sheath after removing the cystoscope. I used the Microvasive bladder evacuator to remove the stone fragments. Inspection of the bladder revealed adequate evacuation of all stone fragments. At this point, I placed the resectoscope element and cutting loop..  The prostate adenoma was then resected utilizing loop cautery resection with the bipolar cutting loop.  The prostate adenoma from the bladder neck back to the verumontanum was resected beginning at the six o'clock position and then extended to include the right and left lobes of the prostate and anterior prostate, respectively. Care was taken not to resect distal to the verumontanum.  Hemostasis was then achieved with the cautery and the bladder was emptied and reinspected with no significant bleeding noted at the end of the procedure.  Resected chips were irrigated from the bladder with the evacuator and sent to pathology.  A 3 way catheter was then placed into the bladder and placed on continuous bladder irrigation.  The patient appeared to tolerate the procedure well and without complications. The patient was able to be awakened and transferred to the recovery unit in satisfactory condition. He tolerated the procedure well.

## 2013-09-29 NOTE — Transfer of Care (Signed)
Immediate Anesthesia Transfer of Care Note  Patient: Henry [name]  Procedure(s) Performed: Procedure(s) (LRB): TRANSURETHRAL RESECTION OF THE PROSTATE WITH GYRUS INSTRUMENTS (N/A) CYSTOSCOPY WITH LITHOLAPAXY (N/A)  Patient Location: PACU  Anesthesia Type: General  Level of Consciousness: awake, oriented, sedated and patient cooperative  Airway & Oxygen Therapy: Patient Spontanous Breathing and Patient connected to face mask oxygen  Post-op Assessment: Report given to PACU RN and Post -op Vital signs reviewed and stable  Post vital signs: Reviewed and stable  Complications: No apparent anesthesia complications

## 2013-09-30 DIAGNOSIS — N138 Other obstructive and reflux uropathy: Secondary | ICD-10-CM | POA: Diagnosis not present

## 2013-09-30 DIAGNOSIS — N401 Enlarged prostate with lower urinary tract symptoms: Secondary | ICD-10-CM | POA: Diagnosis not present

## 2013-09-30 MED ORDER — CALCIUM CARBONATE ANTACID 500 MG PO CHEW
800.0000 mg | CHEWABLE_TABLET | ORAL | Status: DC | PRN
  Administered 2013-09-30: 800 mg via ORAL
  Filled 2013-09-30: qty 4

## 2013-09-30 NOTE — Progress Notes (Signed)
Utilization Review completed.  

## 2013-09-30 NOTE — Discharge Summary (Signed)
Date of admission: 09/29/2013  Date of discharge: 09/30/2013  Admission diagnosis: bladder outlet obstruction, bladder calculus   Discharge diagnosis: as above  Secondary diagnoses:  Patient Active Problem List   Diagnosis Date Noted  . Hypertrophy of prostate with urinary obstruction and other lower urinary tract symptoms (LUTS) 09/29/2013  . BENIGN PROSTATIC HYPERTROPHY, HX OF 01/05/2008  . INTERNAL HEMORRHOIDS 09/23/2000  . ESOPHAGITIS, REFLUX 09/23/2000  . ESOPHAGEAL STRICTURE 09/23/2000  . GERD 09/23/2000  . GASTRITIS, ACUTE 09/23/2000  . DUODENITIS, WITHOUT HEMORRHAGE 09/23/2000  . HIATAL HERNIA 09/23/2000  . DIVERTICULOSIS, COLON 09/23/2000    History and Physical: For full details, please see admission history and physical. Briefly, Henry [name] is a 62 y.o. year old patient with BPH, obstructive median lobe, bladder calculi.   Hospital Course: Patient tolerated the procedure well.  He was then transferred to the floor after an uneventful PACU stay.  His hospital course was uncomplicated.  On POD#1  he had met discharge criteria: was eating a regular diet, was up and ambulating independently,  pain was well controlled, was voiding without a catheter, and was ready to for discharge.   Laboratory values:   Recent Labs  09/29/13 1019  HGB 14.5    Recent Labs  09/29/13 1614  NA 137  K 3.4*  CL 103  CO2 24  GLUCOSE 194*  BUN 13  CREATININE 0.78  CALCIUM 9.0   No results found for this basename: LABPT, INR,  in the last 72 hours No results found for this basename: LABURIN,  in the last 72 hours Results for orders placed during the hospital encounter of 02/27/09  URINE CULTURE     Status: None   Collection Time    02/28/09 12:06 AM      Result Value Range Status   Specimen Description URINE, RANDOM   Final   Special Requests NONE   Final   Colony Count >=100,000 COLONIES/ML   Final   Culture ESCHERICHIA COLI   Final   Report Status 03/04/2009 FINAL    Final   Organism ID, Bacteria ESCHERICHIA COLI   Final  CULTURE, BLOOD (ROUTINE X 2)     Status: None   Collection Time    02/28/09 12:10 AM      Result Value Range Status   Specimen Description BLOOD RIGHT HAND   Final   Special Requests BOTTLES DRAWN AEROBIC AND ANAEROBIC 2 CC EACH   Final   Culture NO GROWTH 5 DAYS   Final   Report Status 03/06/2009 FINAL   Final    Disposition: Home  Discharge instruction: The patient was instructed to be ambulatory but told to refrain from heavy lifting, strenuous activity, or driving.   Discharge medications:    Medication List    STOP taking these medications       finasteride 5 MG tablet  Commonly known as:  PROSCAR     tamsulosin 0.4 MG Caps capsule  Commonly known as:  FLOMAX      TAKE these medications       calcium carbonate 500 MG chewable tablet  Commonly known as:  TUMS - dosed in mg elemental calcium  Chew 1 tablet by mouth as needed for indigestion or heartburn.     CENTAVITE A-Z COMPLETE-MINERAL Tabs  Take by mouth. vegatable / mineral/ vitamin supplement     ciprofloxacin 250 MG tablet  Commonly known as:  CIPRO  Take 1 tablet (250 mg total) by mouth 2 (two) times daily.  vitamin C 1000 MG tablet  Take 1,000 mg by mouth daily.        Followup:      Follow-up Information   Follow up with Chelsea Aus, MD On 10/31/2013. [phone] w/ Denna Haggard, NP)    Specialty:  Urology   Contact information:   [address] [place] [address] [phone]

## 2013-09-30 NOTE — Anesthesia Postprocedure Evaluation (Signed)
  Anesthesia Post-op Note  Patient: Henry [name]  Procedure(s) Performed: Procedure(s) (LRB): TRANSURETHRAL RESECTION OF THE PROSTATE WITH GYRUS INSTRUMENTS (N/A) CYSTOSCOPY WITH LITHOLAPAXY (N/A)  Patient Location: PACU  Anesthesia Type: General  Level of Consciousness: awake and alert   Airway and Oxygen Therapy: Patient Spontanous Breathing  Post-op Pain: mild  Post-op Assessment: Post-op Vital signs reviewed, Patient's Cardiovascular Status Stable, Respiratory Function Stable, Patent Airway and No signs of Nausea or vomiting  Last Vitals:  Filed Vitals:   09/30/13 0620  BP: 115/68  Pulse: 71  Temp: 36.8 C  Resp: 16    Post-op Vital Signs: stable   Complications: No apparent anesthesia complications

## 2013-09-30 NOTE — Progress Notes (Signed)
Urology Inpatient Progress Report  Intv/Subj: No acute events overnight. Patient is without complaint. Some face flushing, no other areas of body noted to have a rash, no fever/itching associated with flushing Some burning with urination Had BM this AM Catheter removed this AM - urine is strawberry colored after 2 voids  Objective: Vital: Filed Vitals:   09/29/13 1500 09/29/13 1530 09/29/13 2217 09/30/13 0620  BP: 146/83 148/68 130/60 115/68  Pulse: 64 62 73 71  Temp:  98.4 F (36.9 C) 98 F (36.7 C) 98.3 F (36.8 C)  TempSrc:   Oral Oral  Resp: 19 18 18 16   Height:  6' (1.829 m)    Weight:  91.173 kg (201 lb)    SpO2: 97% 99% 98% 98%   I/Os: I/O last 3 completed shifts: In: 3977.5 [P.O.:580; I.V.:2397.5; Other:1000] Out: 5925 [Urine:5925]  Past Medical History  Diagnosis Date  . History of kidney stones   . History of bladder stone   . BPH (benign prostatic hypertrophy)   . GERD (gastroesophageal reflux disease)     occasionally uses tums or 1 tbsp vinager  . Nocturia   . Frequency of urination   . Wears glasses    Current Facility-Administered Medications  Medication Dose Route Frequency Provider Last Rate Last Dose  . 0.45 % sodium chloride infusion   Intravenous Continuous Marcine Matar, MD 75 mL/hr at 09/30/13 0427    . calcium carbonate (TUMS - dosed in mg elemental calcium) chewable tablet 800 mg of elemental calcium  800 mg of elemental calcium Oral Q4H PRN Crist Fat, MD   800 mg of elemental calcium at 09/30/13 0037  . docusate sodium (COLACE) capsule 100 mg  100 mg Oral BID Marcine Matar, MD   100 mg at 09/30/13 0906  . HYDROcodone-acetaminophen (NORCO/VICODIN) 5-325 MG per tablet 1-2 tablet  1-2 tablet Oral Q4H PRN Marcine Matar, MD      . ondansetron [facility]) injection 4 mg  4 mg Intravenous Q4H PRN Marcine Matar, MD      . opium-belladonna (B&O SUPPRETTES) suppository 1 suppository  1 suppository Rectal Q6H PRN Marcine Matar,  MD      . zolpidem (AMBIEN) tablet 5 mg  5 mg Oral QHS PRN Marcine Matar, MD        Physical Exam:  General: Patient is in no apparent distress Lungs: Normal respiratory effort, chest expands symmetrically. GI: The abdomen is soft and nontender without mass. Ext: lower extremities symmetric  Lab Results:  Recent Labs  09/29/13 1019  HGB 14.5    Recent Labs  09/29/13 1614  NA 137  K 3.4*  CL 103  CO2 24  GLUCOSE 194*  BUN 13  CREATININE 0.78  CALCIUM 9.0   No results found for this basename: LABPT, INR,  in the last 72 hours No results found for this basename: LABURIN,  in the last 72 hours Results for orders placed during the hospital encounter of 02/27/09  URINE CULTURE     Status: None   Collection Time    02/28/09 12:06 AM      Result Value Range Status   Specimen Description URINE, RANDOM   Final   Special Requests NONE   Final   Colony Count >=100,000 COLONIES/ML   Final   Culture ESCHERICHIA COLI   Final   Report Status 03/04/2009 FINAL   Final   Organism ID, Bacteria ESCHERICHIA COLI   Final  CULTURE, BLOOD (ROUTINE X 2)     Status: None  Collection Time    02/28/09 12:10 AM      Result Value Range Status   Specimen Description BLOOD RIGHT HAND   Final   Special Requests BOTTLES DRAWN AEROBIC AND ANAEROBIC 2 CC EACH   Final   Culture NO GROWTH 5 DAYS   Final   Report Status 03/06/2009 FINAL   Final    Studies/Results:   Assessment: 1 Day Post-Op, bladder stone removal/TURP doing well.  Plan: Will monitor facial flushing - no clear association with medications Would like patient to void one last time to ensure that urine continues to clear Expect d/c prior to lunch.  Berniece Salines W 09/30/2013, 10:19 AM

## 2013-10-02 ENCOUNTER — Encounter (HOSPITAL_BASED_OUTPATIENT_CLINIC_OR_DEPARTMENT_OTHER): Payer: Self-pay | Admitting: Urology

## 2014-08-31 ENCOUNTER — Ambulatory Visit (INDEPENDENT_AMBULATORY_CARE_PROVIDER_SITE_OTHER): Payer: BC Managed Care – PPO | Admitting: Internal Medicine

## 2014-08-31 ENCOUNTER — Ambulatory Visit (INDEPENDENT_AMBULATORY_CARE_PROVIDER_SITE_OTHER)
Admission: RE | Admit: 2014-08-31 | Discharge: 2014-08-31 | Disposition: A | Discharge status: Home / Self Care | Payer: BC Managed Care – PPO | Source: Ambulatory Visit | Attending: Internal Medicine | Admitting: Internal Medicine

## 2014-08-31 ENCOUNTER — Encounter: Payer: Self-pay | Admitting: Internal Medicine

## 2014-08-31 VITALS — BP 130/70 | HR 73 | Ht 71.0 in | Wt 210.0 lb

## 2014-08-31 DIAGNOSIS — R0600 Dyspnea, unspecified: Secondary | ICD-10-CM

## 2014-08-31 DIAGNOSIS — I10 Essential (primary) hypertension: Secondary | ICD-10-CM

## 2014-08-31 MED ORDER — IRBESARTAN 75 MG PO TABS: 75.0000 mg | ORAL_TABLET | Freq: Every day | ORAL | Status: DC

## 2014-08-31 NOTE — Progress Notes (Signed)
Subjective:    Patient ID: Henry [name], male    DOB: [birth year],   MRN: [id number]  HPI  40 yowm quit smoking mid 80's some doe all better and good activity tolerance until start of summer 2015  noted doe trash to street referred to pulmonary clinic 08/31/14  by Dr Marlyn Corporal p neg cardiac w/u not available in [facility]    08/31/2014 1st [facility] office visit/ Mavi Un   Chief Complaint  Patient presents with  . Pulmonary Consult    Referred by Dr. Juanita Craver. Pt c/o DOE and fatigue for the past 4-5 months. He states that he gets out of breath taking out the trash and walking minimal distances. He states that whenever he feels SOB he gets CP.  He also c/o cough for the past 6-8 wks- was prod with brown sputum but this improved after round of abx.   indolent onset doe x 6 m assoc with dry cough  Dysphagia better p stricture rx by Dr Earlean Shawl and also being worked up for variable anemia that doesn't correlate well with severity of sob. 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 No unusual exp hx or h/o childhood pna/ asthma or knowledge of premature birth.  Sleeping ok without nocturnal  or early am exacerbation  of respiratory  c/o's or need for noct saba. Also denies any obvious fluctuation of symptoms with weather or environmental changes or other aggravating or alleviating factors except as outlined above   Current Medications, Allergies, Complete Past Medical History, Past Surgical History, Family History, and Social History were reviewed in [employer] record.           Review of Systems  Constitutional: Negative for fever, chills, activity change, appetite change and unexpected weight change.  HENT: Positive for congestion, sneezing and trouble  swallowing. Negative for dental problem, postnasal drip, rhinorrhea, sore throat and voice change.   Eyes: Negative for visual disturbance.  Respiratory: Positive for cough and shortness of breath. Negative for choking.   Cardiovascular: Positive for chest pain. Negative for leg swelling.  Gastrointestinal: Negative for nausea, vomiting and abdominal pain.  Genitourinary: Negative for difficulty urinating.  Musculoskeletal: Positive for arthralgias.  Skin: Negative for rash.  Psychiatric/Behavioral: Negative for behavioral problems and confusion.       Objective:   Physical Exam  Wt Readings from Last 3 Encounters:  08/31/14 210 lb (95.255 kg)  09/29/13 201 lb (91.173 kg)    amb somber wm nad mild voice fatigue and pseudowheeze  Vital signs reviewed   HEENT: nl dentition, turbinates, and orophanx. Nl external ear canals without cough reflex   NECK :  without JVD/Nodes/TM/ nl carotid upstrokes bilaterally   LUNGS: no acc muscle use, clear to A and P bilaterally without cough on insp or exp maneuvers   CV:  RRR  no s3 or murmur or increase in P2, no edema   ABD:  soft and nontender with nl excursion in the supine position. No bruits or organomegaly, bowel sounds nl  MS:  warm without deformities, calf tenderness, cyanosis or clubbing  SKIN: warm and dry without lesions    NEURO:  alert, approp, no deficits    CXR  08/31/2014 :  Emphysematous changes. No acute abnormalities  Assessment & Plan:

## 2014-08-31 NOTE — Patient Instructions (Addendum)
Stop prinivil  Start Ibasaratan 75 mg daily in its place  Continue the prilosec(omeprazole)  40 mg be sure to Take 30-60 min before first meal of the day   GERD (REFLUX)  is an extremely common cause of respiratory symptoms just like yours , many times with no obvious heartburn at all.    It can be treated with medication, but also with lifestyle changes including avoidance of late meals, excessive alcohol, smoking cessation, and avoid fatty foods, chocolate, peppermint, colas, red wine, and acidic juices such as orange juice.  NO MINT OR MENTHOL PRODUCTS SO NO COUGH DROPS  USE SUGARLESS CANDY INSTEAD (Jolley ranchers or Stover's or Life Savers) or even ice chips will also do - the key is to swallow to prevent all throat clearing. NO OIL BASED VITAMINS - use powdered substitutes.  Please remember to go to the x-ray department downstairs for your tests - we will call you with the results when they are available.      Please schedule a follow up office visit in 4 weeks, sooner if needed with pfts on return

## 2014-09-01 DIAGNOSIS — I10 Essential (primary) hypertension: Secondary | ICD-10-CM | POA: Insufficient documentation

## 2014-09-01 NOTE — Assessment & Plan Note (Addendum)
-   08/31/2014  Walked RA x 3 laps @ 185 ft each stopped due to  End of study, not sob, no desat  Symptoms are markedly disproportionate to objective findings and not clear this is a lung problem but pt does appear to have difficult airway management issues. DDX of  difficult airways management all start with A and  include Adherence, Ace Inhibitors, Acid Reflux, Active Sinus Disease, Alpha 1 Antitripsin deficiency, Anxiety masquerading as Airways dz,  ABPA,  allergy(esp in young), Aspiration (esp in elderly), Adverse effects of DPI,  Active smokers, plus two Bs  = Bronchiectasis and Beta blocker use..and one C= CHF  Adherence is always the initial "prime suspect" and is a multilayered concern that requires a "trust but verify" approach in every patient - starting with knowing how to use medications, especially inhalers, correctly, keeping up with refills and understanding the fundamental difference between maintenance and prns vs those medications only taken for a very short course and then stopped and not refilled.   ACEi top of the usual list of suspects, esp in pt with known GERD > only way to tell is trial off see hbp  ? Acid (or non-acid) GERD > always difficult to exclude as up to 75% of pts in some series report no assoc GI/ Heartburn symptoms - note dilation of stricture does not improve extraesophageal gerd, if anything makes it more likely > rec max (24h)  acid suppression and diet restrictions/ reviewed and instructions given in writing.   Anemia should be added to the A list here but already addressed by Dr Marlyn Corporal with complete GI w/u in progress   ? chf > pt reports neg cards w/u by [facility]  See instructions for specific recommendations which were reviewed directly with the patient who was given a copy with highlighter outlining the key components.

## 2014-09-01 NOTE — Assessment & Plan Note (Signed)
ACE inhibitors are problematic in  pts with airway complaints because  even experienced pulmonologists can't always distinguish ace effects from copd/asthma.  By themselves they don't actually cause a problem, much like oxygen can't by itself start a fire, but they certainly serve as a powerful catalyst or enhancer for any "fire"  or inflammatory process in the upper airway, be it caused by an ET  tube or more commonly reflux (especially in the obese or pts with known GERD or who are on biphoshonates).    In the era of ARB near equivalency until we have a better handle on the reversibility of the airway problem, it just makes sense to avoid ACEI  entirely in the short run - it's the only way to establish this dx  For now rec avapro 75 mg daily and return for pfts in 4 weeks to regroup then

## 2014-09-03 ENCOUNTER — Telehealth: Payer: Self-pay | Admitting: Internal Medicine

## 2014-09-03 NOTE — Progress Notes (Signed)
Quick Note:  LMTCB ______ 

## 2014-09-03 NOTE — Telephone Encounter (Signed)
Call pt: Reviewed cxr and no acute change so no change in recommendations made at ov     I called and spoke with the pt and notified of CXR results  Pt verbalized understanding  Nothing further needed

## 2014-10-03 ENCOUNTER — Ambulatory Visit: Payer: BC Managed Care – PPO | Admitting: Internal Medicine

## 2019-05-08 ENCOUNTER — Emergency Department (HOSPITAL_COMMUNITY)
Admission: EM | Admit: 2019-05-08 | Discharge: 2019-05-08 | Disposition: A | Discharge status: Home / Self Care | Payer: Medicare Other | Attending: Emergency Medicine | Admitting: Emergency Medicine

## 2019-05-08 ENCOUNTER — Encounter (HOSPITAL_COMMUNITY): Payer: Self-pay | Admitting: Emergency Medicine

## 2019-05-08 ENCOUNTER — Emergency Department (HOSPITAL_COMMUNITY): Payer: Medicare Other

## 2019-05-08 DIAGNOSIS — R918 Other nonspecific abnormal finding of lung field: Secondary | ICD-10-CM

## 2019-05-08 DIAGNOSIS — M545 Low back pain, unspecified: Secondary | ICD-10-CM

## 2019-05-08 DIAGNOSIS — I10 Essential (primary) hypertension: Secondary | ICD-10-CM | POA: Diagnosis not present

## 2019-05-08 DIAGNOSIS — Z87891 Personal history of nicotine dependence: Secondary | ICD-10-CM | POA: Diagnosis not present

## 2019-05-08 DIAGNOSIS — I712 Thoracic aortic aneurysm, without rupture, unspecified: Secondary | ICD-10-CM

## 2019-05-08 DIAGNOSIS — Z79899 Other long term (current) drug therapy: Secondary | ICD-10-CM | POA: Insufficient documentation

## 2019-05-08 DIAGNOSIS — D381 Neoplasm of uncertain behavior of trachea, bronchus and lung: Secondary | ICD-10-CM | POA: Insufficient documentation

## 2019-05-08 DIAGNOSIS — M546 Pain in thoracic spine: Secondary | ICD-10-CM | POA: Diagnosis present

## 2019-05-08 DIAGNOSIS — R590 Localized enlarged lymph nodes: Secondary | ICD-10-CM | POA: Insufficient documentation

## 2019-05-08 DIAGNOSIS — R0602 Shortness of breath: Secondary | ICD-10-CM | POA: Diagnosis not present

## 2019-05-08 LAB — URINALYSIS, ROUTINE W REFLEX MICROSCOPIC
Bilirubin Urine: NEGATIVE
Glucose, UA: NEGATIVE mg/dL
Hgb urine dipstick: NEGATIVE
Ketones, ur: NEGATIVE mg/dL
Leukocytes,Ua: NEGATIVE
Nitrite: NEGATIVE
Protein, ur: NEGATIVE mg/dL
Specific Gravity, Urine: 1.029 (ref 1.005–1.030)
pH: 5 (ref 5.0–8.0)

## 2019-05-08 LAB — I-STAT CHEM 8, ED
BUN: 20 mg/dL (ref 8–23)
Calcium, Ion: 1.19 mmol/L (ref 1.15–1.40)
Chloride: 103 mmol/L (ref 98–111)
Creatinine, Ser: 0.8 mg/dL (ref 0.61–1.24)
Glucose, Bld: 93 mg/dL (ref 70–99)
HCT: 43 % (ref 39.0–52.0)
Hemoglobin: 14.6 g/dL (ref 13.0–17.0)
Potassium: 3.7 mmol/L (ref 3.5–5.1)
Sodium: 140 mmol/L (ref 135–145)
TCO2: 26 mmol/L (ref 22–32)

## 2019-05-08 LAB — CBC WITH DIFFERENTIAL/PLATELET
Abs Immature Granulocytes: 0.01 10*3/uL (ref 0.00–0.07)
Basophils Absolute: 0.1 10*3/uL (ref 0.0–0.1)
Basophils Relative: 1 %
Eosinophils Absolute: 0.7 10*3/uL — ABNORMAL HIGH (ref 0.0–0.5)
Eosinophils Relative: 10 %
HCT: 42.7 % (ref 39.0–52.0)
Hemoglobin: 13.8 g/dL (ref 13.0–17.0)
Immature Granulocytes: 0 %
Lymphocytes Relative: 23 %
Lymphs Abs: 1.7 10*3/uL (ref 0.7–4.0)
MCH: 28.9 pg (ref 26.0–34.0)
MCHC: 32.3 g/dL (ref 30.0–36.0)
MCV: 89.5 fL (ref 80.0–100.0)
Monocytes Absolute: 0.7 10*3/uL (ref 0.1–1.0)
Monocytes Relative: 9 %
Neutro Abs: 4 10*3/uL (ref 1.7–7.7)
Neutrophils Relative %: 57 %
Platelets: 183 10*3/uL (ref 150–400)
RBC: 4.77 MIL/uL (ref 4.22–5.81)
RDW: 14.1 % (ref 11.5–15.5)
WBC: 7.1 10*3/uL (ref 4.0–10.5)
nRBC: 0 % (ref 0.0–0.2)

## 2019-05-08 LAB — TROPONIN I (HIGH SENSITIVITY)
Troponin I (High Sensitivity): 11 ng/L (ref <18)
Troponin I (High Sensitivity): 11 ng/L (ref <18)

## 2019-05-08 LAB — COMPREHENSIVE METABOLIC PANEL
ALT: 17 U/L (ref 0–44)
AST: 22 U/L (ref 15–41)
Albumin: 4 g/dL (ref 3.5–5.0)
Alkaline Phosphatase: 67 U/L (ref 38–126)
Anion gap: 11 (ref 5–15)
BUN: 17 mg/dL (ref 8–23)
CO2: 24 mmol/L (ref 22–32)
Calcium: 9.1 mg/dL (ref 8.9–10.3)
Chloride: 105 mmol/L (ref 98–111)
Creatinine, Ser: 0.83 mg/dL (ref 0.61–1.24)
GFR calc Af Amer: >60 mL/min (ref >60)
GFR calc non Af Amer: >60 mL/min (ref >60)
Glucose, Bld: 96 mg/dL (ref 70–99)
Potassium: 3.8 mmol/L (ref 3.5–5.1)
Sodium: 140 mmol/L (ref 135–145)
Total Bilirubin: 0.6 mg/dL (ref 0.3–1.2)
Total Protein: 7.1 g/dL (ref 6.5–8.1)

## 2019-05-08 MED ORDER — FENTANYL CITRATE (PF) 100 MCG/2ML IJ SOLN
50.0000 ug | Freq: Once | INTRAMUSCULAR | Status: AC
  Administered 2019-05-08: 50 ug via INTRAVENOUS
  Filled 2019-05-08: qty 2

## 2019-05-08 MED ORDER — SODIUM CHLORIDE 0.9 % IV BOLUS
1000.0000 mL | Freq: Once | INTRAVENOUS | Status: AC
  Administered 2019-05-08: 1000 mL via INTRAVENOUS

## 2019-05-08 MED ORDER — IOHEXOL 350 MG/ML SOLN
100.0000 mL | Freq: Once | INTRAVENOUS | Status: AC | PRN
  Administered 2019-05-08: 100 mL via INTRAVENOUS

## 2019-05-08 MED ORDER — OXYCODONE-ACETAMINOPHEN 5-325 MG PO TABS: 1.0000 | ORAL_TABLET | ORAL | 0 refills | Status: DC | PRN

## 2019-05-08 MED ORDER — ONDANSETRON HCL 4 MG/2ML IJ SOLN
4.0000 mg | Freq: Once | INTRAMUSCULAR | Status: AC
  Administered 2019-05-08: 4 mg via INTRAVENOUS
  Filled 2019-05-08: qty 2

## 2019-05-08 MED ORDER — OXYCODONE-ACETAMINOPHEN 5-325 MG PO TABS: 1.0000 | ORAL_TABLET | Freq: Four times a day (QID) | ORAL | 0 refills | Status: DC | PRN

## 2019-05-08 MED ORDER — ONDANSETRON 4 MG PO TBDP: ORAL_TABLET | ORAL | 0 refills | Status: DC

## 2019-05-08 MED ORDER — HYDROMORPHONE HCL 1 MG/ML IJ SOLN
0.5000 mg | Freq: Once | INTRAMUSCULAR | Status: AC
  Administered 2019-05-08: 0.5 mg via INTRAVENOUS
  Filled 2019-05-08: qty 1

## 2019-05-08 NOTE — ED Notes (Signed)
Called CT.  They stated they will pick pt up in approx 30 min.

## 2019-05-08 NOTE — ED Triage Notes (Signed)
Pt.stated, Ive had lower back pain with some SOB , throwing up nausea due to medication that makes me sick and I still had the feeling this morning.

## 2019-05-08 NOTE — ED Provider Notes (Addendum)
[facility] EMERGENCY DEPARTMENT Provider Note   CSN: [id number] Arrival date & time: 05/08/19  1358    History   Chief Complaint Chief Complaint  Patient presents with  . Back Pain  . Shortness of Breath  . Emesis  . Nausea    HPI Henry [name] is a 68 y.o. male who presents the emergency department chief complaint of back pain.  Patient had progressively worsening pain 2 days ago.  He states that he thought he might of pulled a muscle while he was doing some sanding of cabinets.  He states that it got progressively worsened and then woke him out of his sleep.  He describes severe deep aching pain in his back radiating into his umbilical area.  He states that he was unable to find a position of comfort.  He was stretching and twisting without relief or change in the pain.  He was able to find an old bottle of Norco and took a pill and finally got to sleep about 50 minutes later.  The next day when he awoke he had continued pain and it progressively worsened throughout the day he did up taking Norco again twice.  He was able to get to sleep and then around 2 AM again he woke with severe pain in the same region.  He took more Norco and that began vomiting.  He had episodes of shortness of breath.  He denies any urinary symptoms, flank pain, hematuria, frequency or urgency.  His pain is currently controlled.  He denies fevers, chills.  He has a past medical history of hypertension and a distant history of smoking.  He was seen by his [facility] care provider earlier who sent him to the emergency department for CT scan.     HPI  Past Medical History:  Diagnosis Date  . BPH (benign prostatic hypertrophy)   . Frequency of urination   . GERD (gastroesophageal reflux disease)    occasionally uses tums or 1 tbsp vinager  . History of bladder stone   . History of kidney stones   . Nocturia   . Wears glasses     Patient Active Problem List   Diagnosis Date Noted  .  Essential hypertension 09/01/2014  . Dyspnea 08/31/2014  . Hypertrophy of prostate with urinary obstruction and other lower urinary tract symptoms (LUTS) 09/29/2013  . BENIGN PROSTATIC HYPERTROPHY, HX OF 01/05/2008  . INTERNAL HEMORRHOIDS 09/23/2000  . ESOPHAGITIS, REFLUX 09/23/2000  . ESOPHAGEAL STRICTURE 09/23/2000  . GERD 09/23/2000  . GASTRITIS, ACUTE 09/23/2000  . DUODENITIS, WITHOUT HEMORRHAGE 09/23/2000  . HIATAL HERNIA 09/23/2000  . DIVERTICULOSIS, COLON 09/23/2000    Past Surgical History:  Procedure Laterality Date  . CYSTO/  LEFT URETEROSCOPIC STONE EXTRACTION/  BLADDER  STONE EXTRACTION  02-15-2009  . CYSTOSCOPY WITH LITHOLAPAXY N/A 09/29/2013   Procedure: CYSTOSCOPY WITH LITHOLAPAXY;  Surgeon: Franchot Gallo, MD;  Location: [facility];  Service: Urology;  Laterality: N/A;  . LUMBAR [place] SURGERY  09-19-2000   LEFT  L4 -- L5  . REPAIR RIGHT HAND SOFT TISSUE STRUCTURES  02-24-2000   WORK INJURY /  PARTIAL AMPUTATION RIGHT INDEX FINGER  . TRANSURETHRAL RESECTION OF PROSTATE N/A 09/29/2013   Procedure: TRANSURETHRAL RESECTION OF THE PROSTATE WITH GYRUS INSTRUMENTS;  Surgeon: Franchot Gallo, MD;  Location: [facility];  Service: Urology;  Laterality: N/A;        Home Medications    Prior to Admission medications   Medication Sig Start  Date End Date Taking? Authorizing Provider  Ascorbic Acid (VITAMIN C) 1000 MG tablet Take 1,000 mg by mouth daily.    [provider]  Cholecalciferol (VITAMIN D3 PO) Take 1 tablet by mouth daily.    [provider]  metoprolol succinate (TOPROL-XL) 50 MG 24 hr tablet Take 50 mg by mouth daily. 03/14/19   [provider]  omeprazole (PRILOSEC) 40 MG capsule Take 40 mg by mouth daily before breakfast.  08/28/14   [provider]  ondansetron (ZOFRAN ODT) 4 MG disintegrating tablet 4mg  ODT q4 hours prn nausea/vomit Patient not taking: Reported on 05/11/2019 05/08/19    Margarita Mail, PA-C  oxyCODONE-acetaminophen (PERCOCET) 5-325 MG tablet Take 1 tablet by mouth every 6 (six) hours as needed. Patient not taking: Reported on 05/11/2019 05/08/19   Margarita Mail, PA-C    Family History No family history on file.  Social History Social History   Tobacco Use  . Smoking status: Former Smoker    Packs/day: 1.00    Years: 20.00    Pack years: 20.00    Types: Cigarettes    Quit date: 09/29/1983    Years since quitting: 35.6  . Smokeless tobacco: Never Used  Substance Use Topics  . Alcohol use: Yes    Comment: rare  . Drug use: No     Allergies   Patient has no known allergies.   Review of Systems Review of Systems Ten systems reviewed and are negative for acute change, except as noted in the HPI.    Physical Exam Updated Vital Signs BP (!) 167/82   Pulse 63   Temp 97.9 F (36.6 C) (Oral)   Resp 16   SpO2 97%   Physical Exam Vitals signs and nursing note reviewed.  Constitutional:      General: He is not in acute distress.    Appearance: He is well-developed. He is not diaphoretic.  HENT:     Head: Normocephalic and atraumatic.  Eyes:     General: No scleral icterus.    Conjunctiva/sclera: Conjunctivae normal.  Neck:     Musculoskeletal: Normal range of motion and neck supple.  Cardiovascular:     Rate and Rhythm: Normal rate and regular rhythm.     Heart sounds: Normal heart sounds.  Pulmonary:     Effort: Pulmonary effort is normal. No respiratory distress.     Breath sounds: Normal breath sounds.  Abdominal:     Palpations: Abdomen is soft.     Tenderness: There is no abdominal tenderness. There is no right CVA tenderness or left CVA tenderness.     Comments: Reducible umbilical hernia  Skin:    General: Skin is warm and dry.  Neurological:     Mental Status: He is alert.  Psychiatric:        Behavior: Behavior normal.      ED Treatments / Results  Labs (all labs ordered are listed, but only abnormal results  are displayed) Labs Reviewed  CBC WITH DIFFERENTIAL/PLATELET - Abnormal; Notable for the following components:      Result Value   Eosinophils Absolute 0.7 (*)    All other components within normal limits  COMPREHENSIVE METABOLIC PANEL  URINALYSIS, ROUTINE W REFLEX MICROSCOPIC  I-STAT CHEM 8, ED  TROPONIN I (HIGH SENSITIVITY)  TROPONIN I (HIGH SENSITIVITY)    EKG EKG Interpretation  Date/Time:  Monday May 08 2019 14:35:54 EDT Ventricular Rate:  69 PR Interval:  222 QRS Duration: 128 QT Interval:  426 QTC Calculation: 456  R Axis:   -30 Text Interpretation:  Sinus rhythm with 1st degree A-V block Left axis deviation Left ventricular hypertrophy with QRS widening and repolarization abnormality Abnormal ECG No significant change since last tracing Confirmed by Isla Pence [phone]) on 05/08/2019 5:21:13 PM   Radiology No results found.  Procedures Procedures (including critical care time)  Medications Ordered in ED Medications  sodium chloride 0.9 % bolus 1,000 mL (0 mLs Intravenous Stopped 05/08/19 2156)  fentaNYL (SUBLIMAZE) injection 50 mcg (50 mcg Intravenous Given 05/08/19 2033)  ondansetron (ZOFRAN) injection 4 mg (4 mg Intravenous Given 05/08/19 2033)  iohexol (OMNIPAQUE) 350 MG/ML injection 100 mL (100 mLs Intravenous Contrast Given 05/08/19 2011)  HYDROmorphone (DILAUDID) injection 0.5 mg (0.5 mg Intravenous Given 05/08/19 2153)     Initial Impression / Assessment and Plan / ED Course  I have reviewed the triage vital signs and the nursing notes.  Pertinent labs & imaging results that were available during my care of the patient were reviewed by me and considered in my medical decision making (see chart for details).  Clinical Course as of May 12 1616  Mon May 08, 2019  1933 Patient still in line for ct . 2 patient's ahead. His pain is currently 8/10. I have ordered pain medications.   [AH]    Clinical Course User Index [AH] Margarita Mail, PA-C       CC:  Back pain/abdominal pain VS:  Vitals:   05/08/19 1541 05/08/19 1955 05/08/19 2155 05/08/19 2230  BP: (!) 147/67 (!) 168/73 (!) 173/82 (!) 167/82  Pulse: 63 64 63 63  Resp: 16 18 16    Temp: 97.9 F (36.6 C)     TempSrc: Oral     SpO2: 99% 100% 99% 97%    UE:AVWUJWJ is gathered by patient and EMR and wife who is at bedside. DDX: The emergent differential diagnosis for back pain includes but is not limited to fracture, muscle strain, cauda equina, spinal stenosis. DDD, ankylosing spondylitis, acute ligamentous injury, disk herniation, spondylolisthesis, Epidural compression syndrome, metastatic cancer, transverse myelitis, vertebral osteomyelitis, diskitis, kidney stone, pyelonephritis, AAA, Perforated ulcer, Retrocecal appendicitis, pancreatitis, bowel obstruction, retroperitoneal hemorrhage or mass, meningitis. The causes for generalized abdominal pain include but are not limited to gastritis, gastroenteritis, IBS, pancreatitis, peritonitis, intestinal ischemia, constipation, UTI, intestinal obstruction, perforated viscus, eg, peptic ulcer, appendix, gallbladder, diverticulitis, physical or sexual abuse, abdominal abscess, ruptured spleen,  diabetic ketoacidosis, hypercalcemia, uremia, parasitic or other infection, eg: tapeworms, flukes, Giargia, Cryto, Yersinia, adrenal insufficiency,lead poisoning, iron toxicity, polyarteritis nodosa, Henoch-Schnlein purpura, porphyria, eg, acute intermittent porphyria, familial Mediterranean fever.  Labs: I reviewed the labs which show troponin without significant elevation, UA negative, CBC without abnormality, CMP without abnormality. Imaging: I personally reviewed the images (CT angios chest abdomen and pelvis) which show(s) likely bronchiogenic mass of the right lung as well as hilar lymphadenopathy.  No other significant abnormalities seen, new abnormalities to account for the patient's pain seen on CT scan EKG:  EKG Interpretation  Date/Time:  Monday  May 08 2019 14:35:54 EDT Ventricular Rate:  69 PR Interval:  222 QRS Duration: 128 QT Interval:  426 QTC Calculation: 456 R Axis:   -30 Text Interpretation:  Sinus rhythm with 1st degree A-V block Left axis deviation Left ventricular hypertrophy with QRS widening and repolarization abnormality Abnormal ECG No significant change since last tracing Confirmed by Isla Pence [phone]) on 05/08/2019 5:21:13 PM      MDM: Patient here with complaint of back pain and abdominal pain.  A  long discussion about the findings with the patient.  I discussed the case with Dr. Burr Medico of the heme-onc service who will have the patient coordinate through nurse case manager and get him into pulmonology for biopsy.  I given the patient pain medication prescription and he has significantly improved after pain medications here in the emergency department.  I answered all the patient's questions to the best of my ability.  Patient will be discharged with close outpatient follow-up Patient disposition: Discharge Patient condition: Stable. The patient appears reasonably screened and/or stabilized for discharge and I doubt any other medical condition or other [facility] requiring further screening, evaluation, or treatment in the ED at this time prior to discharge. I have discussed lab and/or imaging findings with the patient and answered all questions/concerns to the best of my ability. I have discussed return precautions and OP follow up.      Final Clinical Impressions(s) / ED Diagnoses   Final diagnoses:  Mass of lung  Hilar lymphadenopathy  Thoracic aortic aneurysm without rupture (HCC)  Midline low back pain without sciatica, unspecified chronicity    ED Discharge Orders         Ordered    ondansetron (ZOFRAN ODT) 4 MG disintegrating tablet     05/08/19 2221    oxyCODONE-acetaminophen (PERCOCET) 5-325 MG tablet  Every 4 hours PRN,   Status:  Discontinued     05/08/19 2221    oxyCODONE-acetaminophen (PERCOCET)  5-325 MG tablet  Every 6 hours PRN     05/08/19 2224           Margarita Mail, PA-C 05/13/19 1621    Margarita Mail, PA-C 05/13/19 1624    Isla Pence, MD 05/14/19 [phone]

## 2019-05-08 NOTE — Discharge Instructions (Signed)
You should be contacted by the nurse coordinator from the regional [employer] at [facility] in the next 2 to 3 days.  If you are having difficulty accessing care or do not hear from her you may contact Dr. Annamaria Boots whose contact information is in the discharge paperwork. You will also need to follow-up with Dr. Roxan Hockey or the first available CT surgeon for monitoring of your thoracic aortic aneurysm.  Please return to the emergency department to check for any new or worsening symptoms.  I am also giving you a narcotic prescription.  SEEK IMMEDIATE MEDICAL ATTENTION IF: New numbness, tingling, weakness, or problem with the use of your arms or legs.  Severe back pain not relieved with medications.  Change in bowel or bladder control.  Increasing pain in any areas of the body (such as chest or abdominal pain).  Shortness of breath, dizziness or fainting.  Nausea (feeling sick to your stomach), vomiting, fever, or sweats.

## 2019-05-10 ENCOUNTER — Telehealth: Payer: Self-pay | Admitting: *Deleted

## 2019-05-10 ENCOUNTER — Other Ambulatory Visit: Payer: Self-pay

## 2019-05-10 ENCOUNTER — Other Ambulatory Visit: Payer: Self-pay | Admitting: Pulmonary Disease

## 2019-05-10 ENCOUNTER — Ambulatory Visit (INDEPENDENT_AMBULATORY_CARE_PROVIDER_SITE_OTHER): Payer: Medicare Other | Admitting: Pulmonary Disease

## 2019-05-10 DIAGNOSIS — Z87891 Personal history of nicotine dependence: Secondary | ICD-10-CM | POA: Diagnosis not present

## 2019-05-10 DIAGNOSIS — R918 Other nonspecific abnormal finding of lung field: Secondary | ICD-10-CM | POA: Diagnosis not present

## 2019-05-10 DIAGNOSIS — R590 Localized enlarged lymph nodes: Secondary | ICD-10-CM

## 2019-05-10 DIAGNOSIS — R911 Solitary pulmonary nodule: Secondary | ICD-10-CM

## 2019-05-10 NOTE — Telephone Encounter (Signed)
Oncology Nurse Navigator Documentation  Oncology Nurse Navigator Flowsheets 05/10/2019  Diagnosis Status Treatment Based on Clinical Diagnosis  Navigator Follow Up Date: 05/15/2019  Navigator Follow Up Reason: Review Note  Navigator Location [facility]  Referral Date to RadOnc/MedOnc 05/10/2019  Navigator Encounter Type Telephone/I received referral on Mr. Mangel today.  I called to schedule but was unable to reach.  I did leave a vm message with my name and phone number to call.   Telephone Outgoing Call  Abnormal Finding Date 05/08/2019  Treatment Phase Abnormal Scans  Barriers/Navigation Needs Coordination of Care;Education  Education Other  Interventions Coordination of Care;Education  Coordination of Care Other  Education Method Verbal  Acuity Level 2  Time Spent with Patient 15

## 2019-05-10 NOTE — H&P (View-Only) (Signed)
Virtual Visit via Telephone Note  I connected with Henry [name] on 05/10/19 at  2:15 PM EDT by telephone and verified that I am speaking with the correct person using two identifiers.  Location: Patient: Home Provider: [facility] - [address], [place], [place], [place] [address]   I discussed the limitations, risks, security and privacy concerns of performing an evaluation and management service by telephone and the availability of in person appointments. I also discussed with the patient that there may be a patient responsible charge related to this service. The patient expressed understanding and agreed to proceed.  Patient consented to consult via telephone: Yes People present and their role in pt care: Pt    History of Present Illness:  This is a 68 year old gentleman that presents to our office via audio only virtual visit.  He was recently seen in the emergency room found to have a right-sided hilar lung mass with associated mediastinal adenopathy.  He was referred to our office for evaluation of lung mass and potential need for tissue diagnosis and bronchoscopy.  Patient states on occasion he does have some shortness of breath.  He has not had any hemoptysis.  Patient is a former smoker, Quit 36 years ago, a total of 20 years, 1 ppd. Not on any blood thinners or antiplatelets.  He is anxious about the potential underlying diagnosis of lung cancer.  We discussed this today via phone  Chief complaint: Right-sided lung mass, mediastinal adenopathy   Past Medical History:  Diagnosis Date  . BPH (benign prostatic hypertrophy)   . Frequency of urination   . GERD (gastroesophageal reflux disease)    occasionally uses tums or 1 tbsp vinager  . History of bladder stone   . History of kidney stones   . Nocturia   . Wears glasses     Past Surgical History:  Procedure Laterality Date  . CYSTO/  LEFT URETEROSCOPIC STONE EXTRACTION/  BLADDER  STONE EXTRACTION   02-15-2009  . CYSTOSCOPY WITH LITHOLAPAXY N/A 09/29/2013   Procedure: CYSTOSCOPY WITH LITHOLAPAXY;  Surgeon: Franchot Gallo, MD;  Location: [facility];  Service: Urology;  Laterality: N/A;  . LUMBAR [place] SURGERY  09-19-2000   LEFT  L4 -- L5  . REPAIR RIGHT HAND SOFT TISSUE STRUCTURES  02-24-2000   WORK INJURY /  PARTIAL AMPUTATION RIGHT INDEX FINGER  . TRANSURETHRAL RESECTION OF PROSTATE N/A 09/29/2013   Procedure: TRANSURETHRAL RESECTION OF THE PROSTATE WITH GYRUS INSTRUMENTS;  Surgeon: Franchot Gallo, MD;  Location: [facility];  Service: Urology;  Laterality: N/A;   Observations/Objective:  CTA of the chest: Revealed a 3.4 x 2.1 cm right infrahilar lung mass extending into the hilum.  There was evidence of paraseptal emphysema/subpleural blebs in the lung apices as well as a 1.2 cm precarinal lymph node and a 1.5 cm right hilar lymph node.  Assessment and Plan:    ICD-10-CM   1. Hilar mass  R91.8   2. Mediastinal adenopathy  R59.0   3. Former smoker  Z87.891     This patient has radiographic evidence of a right hilar lung mass with associated mediastinal adenopathy is a former smoker.  There is concern for a primary bronchogenic carcinoma.  Person has underwent anesthesia in the past is currently on no blood thinners or antiplatelets.  He is agreeable for [place] to schedule an outpatient bronchoscopy for tissue diagnosis.  Today on the phone we discussed the risk benefits and alternatives of proceeding  with outpatient bronchoscopy to include the anesthesia as well as potential risk for bleeding and pneumothorax.  We will plan to schedule his outpatient bronchoscopy on Tuesday, August 4 in the afternoon at [facility] operating room.  Planned procedure: Video bronchoscopy with endobronchial ultrasound Video bronchoscopy with radial endobronchial ultrasound, possible fluoroscopy   Follow Up Instructions:  Return in about 6 weeks (around 06/21/2019)  for Planned bronchoscopy for next week. Appt to be scheduled..   I discussed the assessment and treatment plan with the patient. The patient was provided an opportunity to ask questions and all were answered. The patient agreed with the plan and demonstrated an understanding of the instructions.   The patient was advised to call back or seek an in-person evaluation if the symptoms worsen or if the condition fails to improve as anticipated.  I provided 28 minutes of non-face-to-face time during this encounter.   Garner Nash, DO

## 2019-05-10 NOTE — Patient Instructions (Signed)
Thank you for visiting Dr. Valeta Harms at [facility]. Today we recommend the following:  Orders Placed This Encounter  Procedures  . Ambulatory referral to Pulmonology   We will attempt to schedule your outpatient bronchoscopy for early next week.  Our office will be contacting you regarding preop testing.  Return in about 6 weeks (around 06/21/2019) for Planned bronchoscopy for next week. Appt to be scheduled..    Please do your part to reduce the spread of COVID-19.

## 2019-05-10 NOTE — Progress Notes (Signed)
Virtual Visit via Telephone Note  I connected with Henry [name] on 05/10/19 at  2:15 PM EDT by telephone and verified that I am speaking with the correct person using two identifiers.  Location: Patient: Home Provider: [facility] - [address], [place], [place], [place] [address]   I discussed the limitations, risks, security and privacy concerns of performing an evaluation and management service by telephone and the availability of in person appointments. I also discussed with the patient that there may be a patient responsible charge related to this service. The patient expressed understanding and agreed to proceed.  Patient consented to consult via telephone: Yes People present and their role in pt care: Pt    History of Present Illness:  This is a 68 year old gentleman that presents to our office via audio only virtual visit.  He was recently seen in the emergency room found to have a right-sided hilar lung mass with associated mediastinal adenopathy.  He was referred to our office for evaluation of lung mass and potential need for tissue diagnosis and bronchoscopy.  Patient states on occasion he does have some shortness of breath.  He has not had any hemoptysis.  Patient is a former smoker, Quit 36 years ago, a total of 20 years, 1 ppd. Not on any blood thinners or antiplatelets.  He is anxious about the potential underlying diagnosis of lung cancer.  We discussed this today via phone  Chief complaint: Right-sided lung mass, mediastinal adenopathy   Past Medical History:  Diagnosis Date  . BPH (benign prostatic hypertrophy)   . Frequency of urination   . GERD (gastroesophageal reflux disease)    occasionally uses tums or 1 tbsp vinager  . History of bladder stone   . History of kidney stones   . Nocturia   . Wears glasses     Past Surgical History:  Procedure Laterality Date  . CYSTO/  LEFT URETEROSCOPIC STONE EXTRACTION/  BLADDER  STONE EXTRACTION   02-15-2009  . CYSTOSCOPY WITH LITHOLAPAXY N/A 09/29/2013   Procedure: CYSTOSCOPY WITH LITHOLAPAXY;  Surgeon: Franchot Gallo, MD;  Location: [facility];  Service: Urology;  Laterality: N/A;  . LUMBAR [place] SURGERY  09-19-2000   LEFT  L4 -- L5  . REPAIR RIGHT HAND SOFT TISSUE STRUCTURES  02-24-2000   WORK INJURY /  PARTIAL AMPUTATION RIGHT INDEX FINGER  . TRANSURETHRAL RESECTION OF PROSTATE N/A 09/29/2013   Procedure: TRANSURETHRAL RESECTION OF THE PROSTATE WITH GYRUS INSTRUMENTS;  Surgeon: Franchot Gallo, MD;  Location: [facility];  Service: Urology;  Laterality: N/A;   Observations/Objective:  CTA of the chest: Revealed a 3.4 x 2.1 cm right infrahilar lung mass extending into the hilum.  There was evidence of paraseptal emphysema/subpleural blebs in the lung apices as well as a 1.2 cm precarinal lymph node and a 1.5 cm right hilar lymph node.  Assessment and Plan:    ICD-10-CM   1. Hilar mass  R91.8   2. Mediastinal adenopathy  R59.0   3. Former smoker  Z87.891     This patient has radiographic evidence of a right hilar lung mass with associated mediastinal adenopathy is a former smoker.  There is concern for a primary bronchogenic carcinoma.  Person has underwent anesthesia in the past is currently on no blood thinners or antiplatelets.  He is agreeable for [place] to schedule an outpatient bronchoscopy for tissue diagnosis.  Today on the phone we discussed the risk benefits and alternatives of proceeding  with outpatient bronchoscopy to include the anesthesia as well as potential risk for bleeding and pneumothorax.  We will plan to schedule his outpatient bronchoscopy on Tuesday, August 4 in the afternoon at [facility] operating room.  Planned procedure: Video bronchoscopy with endobronchial ultrasound Video bronchoscopy with radial endobronchial ultrasound, possible fluoroscopy   Follow Up Instructions:  Return in about 6 weeks (around 06/21/2019)  for Planned bronchoscopy for next week. Appt to be scheduled..   I discussed the assessment and treatment plan with the patient. The patient was provided an opportunity to ask questions and all were answered. The patient agreed with the plan and demonstrated an understanding of the instructions.   The patient was advised to call back or seek an in-person evaluation if the symptoms worsen or if the condition fails to improve as anticipated.  I provided 28 minutes of non-face-to-face time during this encounter.   Garner Nash, DO

## 2019-05-10 NOTE — Telephone Encounter (Signed)
Oncology Nurse Navigator Documentation  Oncology Nurse Navigator Flowsheets 05/10/2019  Diagnosis Status -  Navigator Follow Up Date: -  Navigator Follow Up Reason: -  Navigator Location [facility]  Referral Date to RadOnc/MedOnc -  Navigator Encounter Type Telephone  Telephone Incoming Call/Henry [name] called me back today.  I updated him on his appt to be seen with Dr. Julien Nordmann tomorrow.  He verbalized understanding of appt time and place.   Abnormal Finding Date -  Treatment Phase Abnormal Scans  Barriers/Navigation Needs Coordination of Care;Education  Education Other  Interventions Coordination of Care;Education  Coordination of Care Appts  Education Method Verbal  Acuity Level 1  Time Spent with Patient 30

## 2019-05-11 ENCOUNTER — Other Ambulatory Visit: Payer: Medicare Other

## 2019-05-11 ENCOUNTER — Ambulatory Visit: Payer: Medicare Other | Admitting: Internal Medicine

## 2019-05-12 ENCOUNTER — Other Ambulatory Visit (HOSPITAL_COMMUNITY)
Admission: RE | Admit: 2019-05-12 | Discharge: 2019-05-12 | Disposition: A | Discharge status: Home / Self Care | Payer: Medicare Other | Source: Ambulatory Visit | Attending: Pulmonary Disease | Admitting: Pulmonary Disease

## 2019-05-12 DIAGNOSIS — Z01812 Encounter for preprocedural laboratory examination: Secondary | ICD-10-CM | POA: Insufficient documentation

## 2019-05-12 DIAGNOSIS — Z20828 Contact with and (suspected) exposure to other viral communicable diseases: Secondary | ICD-10-CM | POA: Insufficient documentation

## 2019-05-12 LAB — SARS CORONAVIRUS 2 (TAT 6-24 HRS): SARS Coronavirus 2: NEGATIVE

## 2019-05-15 ENCOUNTER — Encounter (HOSPITAL_COMMUNITY): Payer: Self-pay | Admitting: *Deleted

## 2019-05-15 ENCOUNTER — Other Ambulatory Visit: Payer: Self-pay

## 2019-05-15 ENCOUNTER — Encounter: Payer: Self-pay | Admitting: *Deleted

## 2019-05-15 NOTE — Progress Notes (Signed)
Spoke with pt for pre-op call. Pt denies cardiac history and Diabetes. Is treated for HTN. 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   Pt had Covid 19 test done 05/12/19 and it was negative. Pt states he has been in quarantine but did go out 2 times. States he wore his mask and used sanitizer but didn't come into contact with any people.    Coronavirus Screening  Have you experienced the following symptoms:  Cough  NO Fever (>100.71F)  NO Runny nose NO Sore throat NO Difficulty breathing/shortness of breath  NO  Have you or a family member traveled in the last 14 days and where? NO   Patient reminded that hospital visitation restrictions are in effect and the importance of the restrictions. Informed pt that he may have 1 visitor to come inside the hospital and wait in the waiting room while he is in pre-op and surgery. He voiced understanding.

## 2019-05-15 NOTE — Progress Notes (Signed)
Oncology Nurse Navigator Documentation  Oncology Nurse Navigator Flowsheets 05/15/2019  Diagnosis Status -  Navigator Follow Up Date: -  Navigator Follow Up Reason: -  Navigator Location [facility]-  Referral Date to RadOnc/MedOnc -  Navigator Encounter Type Other/I followed up on Henry [name]'s appt. He missed his appt with Dr. Julien Nordmann last week.  Patient is set up for biopsy on 8/4 and I updated scheduling to re-schedule next week.    Telephone -  Abnormal Finding Date -  Treatment Phase Abnormal Scans  Barriers/Navigation Needs Coordination of Care  Education -  Interventions Coordination of Care  Coordination of Care Other  Education Method -  Acuity Level 1  Time Spent with Patient 15

## 2019-05-16 ENCOUNTER — Ambulatory Visit (HOSPITAL_COMMUNITY): Payer: Medicare Other | Admitting: Certified Registered Nurse Anesthetist

## 2019-05-16 ENCOUNTER — Encounter (HOSPITAL_COMMUNITY): Payer: Self-pay | Admitting: *Deleted

## 2019-05-16 ENCOUNTER — Encounter (HOSPITAL_COMMUNITY)
Admission: RE | Disposition: A | Discharge status: Home / Self Care | Payer: Self-pay | Source: Home / Self Care | Attending: Pulmonary Disease

## 2019-05-16 ENCOUNTER — Other Ambulatory Visit: Payer: Self-pay

## 2019-05-16 ENCOUNTER — Ambulatory Visit (HOSPITAL_COMMUNITY): Payer: Medicare Other

## 2019-05-16 ENCOUNTER — Ambulatory Visit (HOSPITAL_BASED_OUTPATIENT_CLINIC_OR_DEPARTMENT_OTHER)
Admission: RE | Admit: 2019-05-16 | Discharge: 2019-05-16 | Disposition: A | Discharge status: Home / Self Care | Payer: Medicare Other | Source: Home / Self Care | Attending: Pulmonary Disease | Admitting: Pulmonary Disease

## 2019-05-16 DIAGNOSIS — J982 Interstitial emphysema: Secondary | ICD-10-CM | POA: Diagnosis not present

## 2019-05-16 DIAGNOSIS — T8182XA Emphysema (subcutaneous) resulting from a procedure, initial encounter: Secondary | ICD-10-CM | POA: Diagnosis not present

## 2019-05-16 DIAGNOSIS — Z9889 Other specified postprocedural states: Secondary | ICD-10-CM

## 2019-05-16 DIAGNOSIS — R918 Other nonspecific abnormal finding of lung field: Secondary | ICD-10-CM | POA: Diagnosis not present

## 2019-05-16 DIAGNOSIS — R590 Localized enlarged lymph nodes: Secondary | ICD-10-CM | POA: Diagnosis not present

## 2019-05-16 HISTORY — PX: VIDEO BRONCHOSCOPY WITH ENDOBRONCHIAL ULTRASOUND: SHX6177

## 2019-05-16 HISTORY — DX: Essential (primary) hypertension: I10

## 2019-05-16 HISTORY — DX: Pneumonia, unspecified organism: J18.9

## 2019-05-16 HISTORY — DX: Anemia, unspecified: D64.9

## 2019-05-16 HISTORY — DX: Dyspnea, unspecified: R06.00

## 2019-05-16 LAB — PROTIME-INR
INR: 1 (ref 0.8–1.2)
Prothrombin Time: 13.4 seconds (ref 11.4–15.2)

## 2019-05-16 LAB — APTT: aPTT: 30 seconds (ref 24–36)

## 2019-05-16 SURGERY — BRONCHOSCOPY, WITH EBUS
Anesthesia: General

## 2019-05-16 MED ORDER — FENTANYL CITRATE (PF) 100 MCG/2ML IJ SOLN
INTRAMUSCULAR | Status: DC | PRN
  Administered 2019-05-16: 50 ug via INTRAVENOUS
  Administered 2019-05-16: 100 ug via INTRAVENOUS

## 2019-05-16 MED ORDER — DEXAMETHASONE SODIUM PHOSPHATE 10 MG/ML IJ SOLN
INTRAMUSCULAR | Status: AC
  Filled 2019-05-16: qty 1

## 2019-05-16 MED ORDER — ROCURONIUM BROMIDE 10 MG/ML (PF) SYRINGE
PREFILLED_SYRINGE | INTRAVENOUS | Status: AC
  Filled 2019-05-16: qty 10

## 2019-05-16 MED ORDER — ONDANSETRON HCL 4 MG/2ML IJ SOLN
INTRAMUSCULAR | Status: AC
  Filled 2019-05-16: qty 2

## 2019-05-16 MED ORDER — PROPOFOL 10 MG/ML IV BOLUS
INTRAVENOUS | Status: DC | PRN
  Administered 2019-05-16: 40 mg via INTRAVENOUS
  Administered 2019-05-16: 150 mg via INTRAVENOUS

## 2019-05-16 MED ORDER — ALBUTEROL SULFATE HFA 108 (90 BASE) MCG/ACT IN AERS
INHALATION_SPRAY | RESPIRATORY_TRACT | Status: DC | PRN
  Administered 2019-05-16: 4 via RESPIRATORY_TRACT

## 2019-05-16 MED ORDER — PROPOFOL 10 MG/ML IV BOLUS
INTRAVENOUS | Status: AC
  Filled 2019-05-16: qty 20

## 2019-05-16 MED ORDER — ACETAMINOPHEN 10 MG/ML IV SOLN: 1000.0000 mg | Freq: Once | INTRAVENOUS | Status: DC | PRN

## 2019-05-16 MED ORDER — MIDAZOLAM HCL 2 MG/2ML IJ SOLN
INTRAMUSCULAR | Status: AC
  Filled 2019-05-16: qty 2

## 2019-05-16 MED ORDER — OXYCODONE HCL 5 MG/5ML PO SOLN: 5.0000 mg | Freq: Once | ORAL | Status: DC | PRN

## 2019-05-16 MED ORDER — 0.9 % SODIUM CHLORIDE (POUR BTL) OPTIME
TOPICAL | Status: DC | PRN
  Administered 2019-05-16: 1000 mL

## 2019-05-16 MED ORDER — ACETAMINOPHEN 500 MG PO TABS: 1000.0000 mg | ORAL_TABLET | Freq: Once | ORAL | Status: DC | PRN

## 2019-05-16 MED ORDER — ONDANSETRON HCL 4 MG/2ML IJ SOLN
INTRAMUSCULAR | Status: DC | PRN
  Administered 2019-05-16: 4 mg via INTRAVENOUS

## 2019-05-16 MED ORDER — SUCCINYLCHOLINE CHLORIDE 200 MG/10ML IV SOSY
PREFILLED_SYRINGE | INTRAVENOUS | Status: AC
  Filled 2019-05-16: qty 10

## 2019-05-16 MED ORDER — DEXAMETHASONE SODIUM PHOSPHATE 10 MG/ML IJ SOLN
INTRAMUSCULAR | Status: DC | PRN
  Administered 2019-05-16: 10 mg via INTRAVENOUS

## 2019-05-16 MED ORDER — LIDOCAINE 2% (20 MG/ML) 5 ML SYRINGE
INTRAMUSCULAR | Status: DC | PRN
  Administered 2019-05-16: 100 mg via INTRAVENOUS

## 2019-05-16 MED ORDER — FENTANYL CITRATE (PF) 100 MCG/2ML IJ SOLN: 25.0000 ug | INTRAMUSCULAR | Status: DC | PRN

## 2019-05-16 MED ORDER — LIDOCAINE 2% (20 MG/ML) 5 ML SYRINGE
INTRAMUSCULAR | Status: AC
  Filled 2019-05-16: qty 5

## 2019-05-16 MED ORDER — LACTATED RINGERS IV SOLN
INTRAVENOUS | Status: DC
  Administered 2019-05-16 (×2): via INTRAVENOUS

## 2019-05-16 MED ORDER — SUGAMMADEX SODIUM 200 MG/2ML IV SOLN
INTRAVENOUS | Status: DC | PRN
  Administered 2019-05-16: 200 mg via INTRAVENOUS

## 2019-05-16 MED ORDER — ALBUTEROL SULFATE HFA 108 (90 BASE) MCG/ACT IN AERS
INHALATION_SPRAY | RESPIRATORY_TRACT | Status: AC
  Filled 2019-05-16: qty 6.7

## 2019-05-16 MED ORDER — EPHEDRINE SULFATE-NACL 50-0.9 MG/10ML-% IV SOSY
PREFILLED_SYRINGE | INTRAVENOUS | Status: DC | PRN
  Administered 2019-05-16: 10 mg via INTRAVENOUS
  Administered 2019-05-16: 5 mg via INTRAVENOUS

## 2019-05-16 MED ORDER — MIDAZOLAM HCL 2 MG/2ML IJ SOLN
INTRAMUSCULAR | Status: DC | PRN
  Administered 2019-05-16: 2 mg via INTRAVENOUS

## 2019-05-16 MED ORDER — PHENYLEPHRINE 40 MCG/ML (10ML) SYRINGE FOR IV PUSH (FOR BLOOD PRESSURE SUPPORT)
PREFILLED_SYRINGE | INTRAVENOUS | Status: DC | PRN
  Administered 2019-05-16 (×7): 80 ug via INTRAVENOUS
  Administered 2019-05-16: 120 ug via INTRAVENOUS
  Administered 2019-05-16: 80 ug via INTRAVENOUS

## 2019-05-16 MED ORDER — OXYCODONE HCL 5 MG PO TABS: 5.0000 mg | ORAL_TABLET | Freq: Once | ORAL | Status: DC | PRN

## 2019-05-16 MED ORDER — ACETAMINOPHEN 160 MG/5ML PO SOLN: 1000.0000 mg | Freq: Once | ORAL | Status: DC | PRN

## 2019-05-16 MED ORDER — ROCURONIUM BROMIDE 50 MG/5ML IV SOSY
PREFILLED_SYRINGE | INTRAVENOUS | Status: DC | PRN
  Administered 2019-05-16 (×2): 10 mg via INTRAVENOUS
  Administered 2019-05-16: 40 mg via INTRAVENOUS

## 2019-05-16 MED ORDER — FENTANYL CITRATE (PF) 250 MCG/5ML IJ SOLN
INTRAMUSCULAR | Status: AC
  Filled 2019-05-16: qty 5

## 2019-05-16 MED ORDER — SUCCINYLCHOLINE CHLORIDE 200 MG/10ML IV SOSY
PREFILLED_SYRINGE | INTRAVENOUS | Status: DC | PRN
  Administered 2019-05-16: 100 mg via INTRAVENOUS

## 2019-05-16 SURGICAL SUPPLY — 51 items
ADAPTER BRONCHOSCOPE OLYMPUS (ADAPTER) IMPLANT
ADAPTER VALVE BIOPSY EBUS (MISCELLANEOUS) IMPLANT
ADPTR VALVE BIOPSY EBUS (MISCELLANEOUS)
BALLN FOR EBUS SCOPE (BALLOONS) ×3
BALLOON FOR EBUS SCOPE (BALLOONS) ×1 IMPLANT
BRUSH CYTOL CELLEBRITY 1.5X140 (MISCELLANEOUS) IMPLANT
BRUSH SUPERTRAX BIOPSY (INSTRUMENTS) IMPLANT
BRUSH SUPERTRAX NDL-TIP CYTO (INSTRUMENTS) IMPLANT
CANISTER SUCT 3000ML PPV (MISCELLANEOUS) ×3 IMPLANT
CHANNEL WORK EXTEND EDGE 180 (KITS) IMPLANT
CHANNEL WORK EXTEND EDGE 90 (KITS) IMPLANT
CONT SPEC 4OZ CLIKSEAL STRL BL (MISCELLANEOUS) ×6 IMPLANT
COVER BACK TABLE 60X90IN (DRAPES) ×3 IMPLANT
COVER WAND RF STERILE (DRAPES) IMPLANT
FILTER STRAW FLUID ASPIR (MISCELLANEOUS) IMPLANT
FORCEPS BIOP RJ4 1.8 (CUTTING FORCEPS) IMPLANT
FORCEPS BIOP SUPERTRX PREMAR (INSTRUMENTS) IMPLANT
GAUZE SPONGE 4X4 12PLY STRL (GAUZE/BANDAGES/DRESSINGS) ×3 IMPLANT
GLOVE SURG SS PI 7.5 STRL IVOR (GLOVE) ×6 IMPLANT
GOWN STRL REUS W/ TWL LRG LVL3 (GOWN DISPOSABLE) ×2 IMPLANT
GOWN STRL REUS W/TWL LRG LVL3 (GOWN DISPOSABLE) ×4
KIT CLEAN ENDO COMPLIANCE (KITS) ×6 IMPLANT
KIT LOCATABLE GUIDE (CANNULA) IMPLANT
KIT MARKER FIDUCIAL DELIVERY (KITS) IMPLANT
KIT PROCEDURE EDGE 180 (KITS) IMPLANT
KIT PROCEDURE EDGE 90 (KITS) IMPLANT
KIT TURNOVER KIT B (KITS) ×3 IMPLANT
MARKER SKIN DUAL TIP RULER LAB (MISCELLANEOUS) ×3 IMPLANT
NEEDLE ASPIRATION VIZISHOT 19G (NEEDLE) IMPLANT
NEEDLE ASPIRATION VIZISHOT 21G (NEEDLE) ×3 IMPLANT
NEEDLE SUPERTRX PREMARK BIOPSY (NEEDLE) IMPLANT
NS IRRIG 1000ML POUR BTL (IV SOLUTION) ×3 IMPLANT
OIL SILICONE PENTAX (PARTS (SERVICE/REPAIRS)) ×3 IMPLANT
PAD ARMBOARD 7.5X6 YLW CONV (MISCELLANEOUS) ×6 IMPLANT
PATCHES PATIENT (LABEL) IMPLANT
STOPCOCK 4 WAY LG BORE MALE ST (IV SETS) ×3 IMPLANT
SYR 20ML ECCENTRIC (SYRINGE) ×6 IMPLANT
SYR 20ML LL LF (SYRINGE) ×6 IMPLANT
SYR 3ML LL SCALE MARK (SYRINGE) ×3 IMPLANT
SYR 50ML SLIP (SYRINGE) IMPLANT
SYR 5ML LL (SYRINGE) IMPLANT
TOWEL GREEN STERILE FF (TOWEL DISPOSABLE) ×3 IMPLANT
TRAP SPECIMEN MUCOUS 40CC (MISCELLANEOUS) IMPLANT
TUBE CONNECTING 20'X1/4 (TUBING) ×1
TUBE CONNECTING 20X1/4 (TUBING) ×2 IMPLANT
TUBING EXTENTION W/L.L. (IV SETS) ×3 IMPLANT
UNDERPAD 30X30 (UNDERPADS AND DIAPERS) ×3 IMPLANT
VALVE BIOPSY  SINGLE USE (MISCELLANEOUS)
VALVE BIOPSY SINGLE USE (MISCELLANEOUS) IMPLANT
VALVE SUCTION BRONCHIO DISP (MISCELLANEOUS) ×6 IMPLANT
WATER STERILE IRR 1000ML POUR (IV SOLUTION) ×3 IMPLANT

## 2019-05-16 NOTE — Anesthesia Procedure Notes (Signed)
Procedure Name: Intubation Date/Time: 05/16/2019 12:56 PM Performed by: Imagene Riches, CRNA Pre-anesthesia Checklist: Patient identified, Emergency Drugs available, Suction available and Patient being monitored Patient Re-evaluated:Patient Re-evaluated prior to induction Oxygen Delivery Method: Circle System Utilized Preoxygenation: Pre-oxygenation with 100% oxygen Induction Type: IV induction Ventilation: Mask ventilation without difficulty Laryngoscope Size: Miller and 2 Grade View: Grade I Tube type: Oral Tube size: 8.5 mm Number of attempts: 1 Airway Equipment and Method: Stylet and Oral airway Placement Confirmation: ETT inserted through vocal cords under direct vision,  positive ETCO2 and breath sounds checked- equal and bilateral Secured at: 23 cm Tube secured with: Tape Dental Injury: Teeth and Oropharynx as per pre-operative assessment

## 2019-05-16 NOTE — Progress Notes (Signed)
   05/16/19 1100  OBSTRUCTIVE SLEEP APNEA  Neck circumference greater than:Male 16 inches or larger, Male 17inches or larger? 1  Obstructive Sleep Apnea Score 1  Score 5 or greater  Results sent to PCP

## 2019-05-16 NOTE — Transfer of Care (Signed)
Immediate Anesthesia Transfer of Care Note  Patient: Henry [name]  Procedure(s) Performed: VIDEO BRONCHOSCOPY WITH ENDOBRONCHIAL ULTRASOUND (N/A )  Patient Location: PACU  Anesthesia Type:General  Level of Consciousness: drowsy  Airway & Oxygen Therapy: Patient Spontanous Breathing and Patient connected to face mask oxygen  Post-op Assessment: Report given to RN and Post -op Vital signs reviewed and stable  Post vital signs: Reviewed and stable  Last Vitals:  Vitals Value Taken Time  BP 139/78 05/16/19 1516  Temp 36.1 C 05/16/19 1515  Pulse 74 05/16/19 1520  Resp 13 05/16/19 1520  SpO2 100 % 05/16/19 1520  Vitals shown include unvalidated device data.  Last Pain:  Vitals:   05/16/19 1515  TempSrc:   PainSc: Asleep      Patients Stated Pain Goal: 8 (53/64/68 0321)  Complications: No apparent anesthesia complications

## 2019-05-16 NOTE — Anesthesia Postprocedure Evaluation (Signed)
Anesthesia Post Note  Patient: Jhony Antrim Teems  Procedure(s) Performed: VIDEO BRONCHOSCOPY WITH ENDOBRONCHIAL ULTRASOUND (N/A )     Patient location during evaluation: PACU Anesthesia Type: General Level of consciousness: awake and alert Pain management: pain level controlled Vital Signs Assessment: post-procedure vital signs reviewed and stable Respiratory status: spontaneous breathing, nonlabored ventilation, respiratory function stable and patient connected to nasal cannula oxygen Cardiovascular status: blood pressure returned to baseline and stable Postop Assessment: no apparent nausea or vomiting Anesthetic complications: no    Last Vitals:  Vitals:   05/16/19 1531 05/16/19 1600  BP: (!) 154/59   Pulse: 77   Resp: 14   Temp:  (!) 36.1 C  SpO2: 96%     Last Pain:  Vitals:   05/16/19 1600  TempSrc:   PainSc: 0-No pain                 Catalina Gravel

## 2019-05-16 NOTE — Discharge Instructions (Signed)
Flexible Bronchoscopy, Care After This sheet gives you information about how to care for yourself after your test. Your doctor may also give you more specific instructions. If you have problems or questions, contact your doctor. Follow these instructions at home: Eating and drinking  The day after the test, go back to your normal diet. Driving  Do not drive for 24 hours if you were given a medicine to help you relax (sedative).  Do not drive or use heavy machinery while taking prescription pain medicine. General instructions   Take over-the-counter and prescription medicines only as told by your doctor.  Return to your normal activities as told. Ask what activities are safe for you.  Do not use any products that have nicotine or tobacco in them. This includes cigarettes and e-cigarettes. If you need help quitting, ask your doctor.  Keep all follow-up visits as told by your doctor. This is important. It is very important if you had a tissue sample (biopsy) taken. Get help right away if:  You have shortness of breath that gets worse.  You get light-headed.  You feel like you are going to pass out (faint).  You have chest pain.  You cough up: ? More than a little blood. ? More blood than before. Summary  Do not eat or drink anything (not even water) for 2 hours after your test, or until your numbing medicine wears off.  Do not use cigarettes. Do not use e-cigarettes.  Get help right away if you have chest pain. This information is not intended to replace advice given to you by your health care provider. Make sure you discuss any questions you have with your health care provider. Document Released: 07/26/2009 Document Revised: 09/10/2017 Document Reviewed: 10/16/2016 Elsevier Patient Education  2020 [employer].

## 2019-05-16 NOTE — Interval H&P Note (Signed)
History and Physical Interval Note:  05/16/2019 12:34 PM  Henry [name]  has presented today for surgery, with the diagnosis of hylar mass, mediastinal adenopathy.  The various methods of treatment have been discussed with the patient and family. After consideration of risks, benefits and other options for treatment, the patient has consented to  Procedure(s): VIDEO BRONCHOSCOPY WITH ENDOBRONCHIAL ULTRASOUND, POSSIBLE FLUOROSCOPY (N/A) VIDEO BRONCHOSCOPY WITH RADIAL ENDOBRONCHIAL ULTRASOUND (N/A) as a surgical intervention.  The patient's history has been reviewed, patient examined, no change in status, stable for surgery.  I have reviewed the patient's chart and labs.  Questions were answered to the patient's satisfaction.    Patient seen and examined in the pre-op holding area. Reviewed indications for planned bronchoscopy and tissue sampling. Reviewed images with patient. Reviewed risks, benefits and alternatives.   BP (!) 173/71   Pulse 65   Temp 97.8 F (36.6 C) (Oral)   Resp 16   Ht 5\' 11"  (1.803 m)   Wt 96.6 kg   SpO2 99%   BMI 29.71 kg/m   Gen: resting in bed, NAD Heart: RRR, s1 s2 Lungs: CTAB, no wheeze Abd: soft, nt nd   A: Right hilar lung mass Mediastinal adenopathy   P: EBUS with TBNA  Possible Radial EBUS with TBNA, brushings and forcep biopsies   Patient freely signed consent. All questions answered.   Henry Nash, DO [facility] 05/16/2019 12:36 PM

## 2019-05-16 NOTE — Anesthesia Preprocedure Evaluation (Signed)
Anesthesia Evaluation  Patient identified by MRN, date of birth, ID band Patient awake    Reviewed: Allergy & Precautions, NPO status , Patient's Chart, lab work & pertinent test results, reviewed documented beta blocker date and time   History of Anesthesia Complications Negative for: history of anesthetic complications  Airway Mallampati: II  TM Distance: >3 FB Neck ROM: Full    Dental  (+) Dental Advisory Given   Pulmonary shortness of breath, neg COPD, neg recent URI, former smoker,    breath sounds clear to auscultation       Cardiovascular hypertension, Pt. on medications and Pt. on home beta blockers  Rhythm:Regular     Neuro/Psych  Neuromuscular disease negative psych ROS   GI/Hepatic Neg liver ROS, GERD  Controlled and Medicated,  Endo/Other  negative endocrine ROS  Renal/GU negative Renal ROS     Musculoskeletal   Abdominal   Peds  Hematology negative hematology ROS (+)   Anesthesia Other Findings   Reproductive/Obstetrics                             Anesthesia Physical Anesthesia Plan  ASA: III  Anesthesia Plan: General   Post-op Pain Management:    Induction: Intravenous  PONV Risk Score and Plan: 2 and Ondansetron and Dexamethasone  Airway Management Planned: Oral ETT  Additional Equipment: None  Intra-op Plan:   Post-operative Plan: Extubation in OR  Informed Consent: I have reviewed the patients History and Physical, chart, labs and discussed the procedure including the risks, benefits and alternatives for the proposed anesthesia with the patient or authorized representative who has indicated his/her understanding and acceptance.     Dental advisory given  Plan Discussed with: CRNA and Surgeon  Anesthesia Plan Comments:         Anesthesia Quick Evaluation

## 2019-05-16 NOTE — Progress Notes (Signed)
Attempted to send patient's positive stop bang tool to PCP, however patient does not currently have a PCP established since Dr. Tollie Pizza retired.

## 2019-05-16 NOTE — Addendum Note (Signed)
Addended by: Lauraine Rinne on: 05/16/2019 03:57 PM   Modules accepted: Orders

## 2019-05-16 NOTE — Progress Notes (Signed)
   05/16/19 1121  OBSTRUCTIVE SLEEP APNEA  Have you ever been diagnosed with sleep apnea through a sleep study? No  Do you snore loudly (loud enough to be heard through closed doors)?  1  Do you often feel tired, fatigued, or sleepy during the daytime (such as falling asleep during driving or talking to someone)? 0  Has anyone observed you stop breathing during your sleep? 0  Do you have, or are you being treated for high blood pressure? 1  BMI more than 35 kg/m2? 0  Age > 50 (1-yes) 1  Neck circumference greater than:Male 16 inches or larger, Male 17inches or larger? 1  Male Gender (Yes=1) 1  Obstructive Sleep Apnea Score 5  Score 5 or greater  Results sent to PCP

## 2019-05-16 NOTE — Op Note (Addendum)
Video Bronchoscopy with Endobronchial Ultrasound Procedure Note  Date of Operation: 05/16/2019  Pre-op Diagnosis: Mediastinal adenopathy, right hilar mass  Post-op Diagnosis: Mediastinal adenopathy, right hilar mass  Surgeon: Garner Nash, DO   Assistants: None   Anesthesia: General endotracheal anesthesia  Operation: Flexible video fiberoptic bronchoscopy with endobronchial ultrasound and biopsies.  Estimated Blood Loss: Minimal, <4GY   Complications: None   Indications and History: Henry [name] is a 68 y.o. male with incidentally found 3.4 cm right hilar mass with associated mediastinal adenopathy.  The risks, benefits, complications, treatment options and expected outcomes were discussed with the patient.  The possibilities of pneumothorax, pneumonia, reaction to medication, pulmonary aspiration, perforation of a viscus, bleeding, failure to diagnose a condition and creating a complication requiring transfusion or operation were discussed with the patient who freely signed the consent.    Description of Procedure: The patient was examined in the preoperative area and history and data from the preprocedure consultation were reviewed. It was deemed appropriate to proceed.  The patient was taken to [facility], identified as Henry [name] and the procedure verified as Flexible Video Fiberoptic Bronchoscopy.  A Time Out was held and the above information confirmed. After being taken to the operating room general anesthesia was initiated and the patient  was orally intubated. The video fiberoptic bronchoscope was introduced via the endotracheal tube and a general inspection was performed which showed distal bronchiectatic openings, striations and airway pitting.  Patient's right upper lobe anatomy was an anatomical variant to include only single bifurcation appearing to have the anterior and apical segment combined and a single posterior segment. The standard scope was then withdrawn and the  endobronchial ultrasound was used to identify and characterize the peritracheal, hilar and bronchial lymph nodes. Inspection showed normal left hilum, enlarged station 7 node, enlarged station 4R as well as a visible greater than 3 cm cross-section right hilar mass within the distal right lower lobe along the anterior wall.. Using real-time ultrasound guidance Wang needle biopsies were take from Station 7, 4R and right hilar mass were sent for cytology. The patient tolerated the procedure well without apparent complications.  Following the procedure the bronchoscope was used for therapeutic suctioning of block the distal airways with blood clots and secretions.  We then used the standard bronchoscope to complete cytology brushings of all the distal subsegments of the right lower lobe.  Right lower lobe anatomy also anatomic variant to include what appears to be an additional anterior segment besides the standard anterior, lateral posterior segment bundle.  We also completed a standard BAL with 120 cc of saline and moderate return. Bronchoscope was returned to just above the main carina and there was no significant blood loss. The bronchoscope was withdrawn. Anesthesia was reversed and the patient was taken to the PACU for recovery.   Samples: 1. Wang needle biopsies from 7 node, x8, 3 slides, 5 cell block 2. Wang needle biopsies from 4Rnode, x6, 3 slides, 3 cell block 3. Wang needle biopsies from right hilar mass, x16, 8 slides, 8 cell block 4.  Brushings from all distal subsegments of the right lower lobe 5.  Bronchoalveolar lavage of the right lower lobe sent for cytology  Plans:  The patient will be discharged from the PACU to home when recovered from anesthesia. We will review the cytology, pathology and microbiology results with the patient when they become available. Outpatient followup will be with Garner Nash, DO.   Garner Nash, DO [facility] 05/16/2019 3:14 PM   Personal pager: [phone] If unanswered, please page CCM On-call: [phone]

## 2019-05-17 ENCOUNTER — Other Ambulatory Visit: Payer: Self-pay

## 2019-05-17 ENCOUNTER — Ambulatory Visit: Payer: Medicare Other

## 2019-05-17 ENCOUNTER — Ambulatory Visit (INDEPENDENT_AMBULATORY_CARE_PROVIDER_SITE_OTHER): Payer: Medicare Other

## 2019-05-17 ENCOUNTER — Encounter (HOSPITAL_COMMUNITY): Payer: Self-pay

## 2019-05-17 ENCOUNTER — Telehealth: Payer: Self-pay | Admitting: Pulmonary Disease

## 2019-05-17 ENCOUNTER — Telehealth: Payer: Self-pay | Admitting: Primary Care

## 2019-05-17 ENCOUNTER — Inpatient Hospital Stay (HOSPITAL_COMMUNITY): Payer: Medicare Other

## 2019-05-17 ENCOUNTER — Inpatient Hospital Stay (HOSPITAL_COMMUNITY)
Admission: EM | Admit: 2019-05-17 | Discharge: 2019-05-19 | DRG: 921 | Disposition: A | Discharge status: Home / Self Care | Payer: Medicare Other | Source: Ambulatory Visit | Attending: Internal Medicine | Admitting: Internal Medicine

## 2019-05-17 ENCOUNTER — Encounter (HOSPITAL_COMMUNITY): Payer: Self-pay | Admitting: Pulmonary Disease

## 2019-05-17 ENCOUNTER — Telehealth: Payer: Self-pay | Admitting: Internal Medicine

## 2019-05-17 ENCOUNTER — Ambulatory Visit (INDEPENDENT_AMBULATORY_CARE_PROVIDER_SITE_OTHER): Payer: Medicare Other | Admitting: Primary Care

## 2019-05-17 DIAGNOSIS — Z20828 Contact with and (suspected) exposure to other viral communicable diseases: Secondary | ICD-10-CM | POA: Diagnosis not present

## 2019-05-17 DIAGNOSIS — J982 Interstitial emphysema: Secondary | ICD-10-CM | POA: Diagnosis present

## 2019-05-17 DIAGNOSIS — Y848 Other medical procedures as the cause of abnormal reaction of the patient, or of later complication, without mention of misadventure at the time of the procedure: Secondary | ICD-10-CM | POA: Diagnosis present

## 2019-05-17 DIAGNOSIS — I10 Essential (primary) hypertension: Secondary | ICD-10-CM | POA: Diagnosis not present

## 2019-05-17 DIAGNOSIS — R079 Chest pain, unspecified: Secondary | ICD-10-CM

## 2019-05-17 DIAGNOSIS — K219 Gastro-esophageal reflux disease without esophagitis: Secondary | ICD-10-CM | POA: Diagnosis not present

## 2019-05-17 DIAGNOSIS — I319 Disease of pericardium, unspecified: Secondary | ICD-10-CM | POA: Diagnosis not present

## 2019-05-17 DIAGNOSIS — R0789 Other chest pain: Secondary | ICD-10-CM

## 2019-05-17 DIAGNOSIS — R590 Localized enlarged lymph nodes: Secondary | ICD-10-CM | POA: Diagnosis present

## 2019-05-17 DIAGNOSIS — Z87891 Personal history of nicotine dependence: Secondary | ICD-10-CM

## 2019-05-17 DIAGNOSIS — Z79899 Other long term (current) drug therapy: Secondary | ICD-10-CM | POA: Diagnosis not present

## 2019-05-17 DIAGNOSIS — T8182XA Emphysema (subcutaneous) resulting from a procedure, initial encounter: Principal | ICD-10-CM | POA: Diagnosis present

## 2019-05-17 DIAGNOSIS — R918 Other nonspecific abnormal finding of lung field: Secondary | ICD-10-CM | POA: Diagnosis present

## 2019-05-17 LAB — BASIC METABOLIC PANEL
Anion gap: 10 (ref 5–15)
BUN: 20 mg/dL (ref 8–23)
CO2: 24 mmol/L (ref 22–32)
Calcium: 9.1 mg/dL (ref 8.9–10.3)
Chloride: 107 mmol/L (ref 98–111)
Creatinine, Ser: 0.97 mg/dL (ref 0.61–1.24)
GFR calc Af Amer: >60 mL/min (ref >60)
GFR calc non Af Amer: >60 mL/min (ref >60)
Glucose, Bld: 111 mg/dL — ABNORMAL HIGH (ref 70–99)
Potassium: 4 mmol/L (ref 3.5–5.1)
Sodium: 141 mmol/L (ref 135–145)

## 2019-05-17 LAB — CBC WITH DIFFERENTIAL/PLATELET
Abs Immature Granulocytes: 0.04 10*3/uL (ref 0.00–0.07)
Basophils Absolute: 0.1 10*3/uL (ref 0.0–0.1)
Basophils Relative: 0 %
Eosinophils Absolute: 0.2 10*3/uL (ref 0.0–0.5)
Eosinophils Relative: 1 %
HCT: 40.1 % (ref 39.0–52.0)
Hemoglobin: 13.1 g/dL (ref 13.0–17.0)
Immature Granulocytes: 0 %
Lymphocytes Relative: 14 %
Lymphs Abs: 1.8 10*3/uL (ref 0.7–4.0)
MCH: 28.9 pg (ref 26.0–34.0)
MCHC: 32.7 g/dL (ref 30.0–36.0)
MCV: 88.3 fL (ref 80.0–100.0)
Monocytes Absolute: 1.1 10*3/uL — ABNORMAL HIGH (ref 0.1–1.0)
Monocytes Relative: 8 %
Neutro Abs: 10.1 10*3/uL — ABNORMAL HIGH (ref 1.7–7.7)
Neutrophils Relative %: 77 %
Platelets: 181 10*3/uL (ref 150–400)
RBC: 4.54 MIL/uL (ref 4.22–5.81)
RDW: 14.5 % (ref 11.5–15.5)
WBC: 13.3 10*3/uL — ABNORMAL HIGH (ref 4.0–10.5)
nRBC: 0 % (ref 0.0–0.2)

## 2019-05-17 LAB — SARS CORONAVIRUS 2 (TAT 6-24 HRS): SARS Coronavirus 2: NEGATIVE

## 2019-05-17 MED ORDER — PANTOPRAZOLE SODIUM 40 MG PO TBEC
40.0000 mg | DELAYED_RELEASE_TABLET | Freq: Every day | ORAL | Status: DC
  Administered 2019-05-18 – 2019-05-19 (×2): 40 mg via ORAL
  Filled 2019-05-17 (×2): qty 1

## 2019-05-17 MED ORDER — ACETAMINOPHEN 325 MG PO TABS: 650.0000 mg | ORAL_TABLET | Freq: Four times a day (QID) | ORAL | Status: DC | PRN

## 2019-05-17 MED ORDER — ONDANSETRON HCL 4 MG PO TABS: 4.0000 mg | ORAL_TABLET | Freq: Four times a day (QID) | ORAL | Status: DC | PRN

## 2019-05-17 MED ORDER — ONDANSETRON HCL 4 MG/2ML IJ SOLN: 4.0000 mg | Freq: Four times a day (QID) | INTRAMUSCULAR | Status: DC | PRN

## 2019-05-17 MED ORDER — OXYCODONE-ACETAMINOPHEN 5-325 MG PO TABS
1.0000 | ORAL_TABLET | Freq: Four times a day (QID) | ORAL | Status: DC | PRN
  Administered 2019-05-17: 1 via ORAL
  Filled 2019-05-17: qty 1

## 2019-05-17 MED ORDER — ENOXAPARIN SODIUM 40 MG/0.4ML ~~LOC~~ SOLN
40.0000 mg | SUBCUTANEOUS | Status: DC
  Administered 2019-05-17 – 2019-05-18 (×2): 40 mg via SUBCUTANEOUS
  Filled 2019-05-17 (×2): qty 0.4

## 2019-05-17 MED ORDER — ACETAMINOPHEN 650 MG RE SUPP: 650.0000 mg | Freq: Four times a day (QID) | RECTAL | Status: DC | PRN

## 2019-05-17 NOTE — Telephone Encounter (Addendum)
If patient is having excruciating chest pain need ED visit. If its such some discomfort needs office visit today with STAT CXR before visit. Thanks

## 2019-05-17 NOTE — Telephone Encounter (Signed)
Spoke with pt. States that he is having some issues after his bronch yesterday. Reports having chest pains. Pain is in the center of chest near his sternum. States that this has happened to him before but with strenuous activity. This is first time this has happened while sedentary. Denies chest tightness, left arm pain, N/V or diaphoresis.  Pt would like recommendations.  Tonya - please advise. Thanks.

## 2019-05-17 NOTE — Telephone Encounter (Signed)
Called and spoke with pt letting him know that we needed him to come to office for appt now and have a cxr prior. Stated to pt that we were needing to change his appt time to 2pm and have a cxr done before the OV. Pt verbalized understanding. I verified our office address with pt to make sure he had the correct address. Nothing further needed.

## 2019-05-17 NOTE — Consult Note (Signed)
NAME:  Henry [name], MRN:  [id number], DOB:  1951-04-22, LOS: 0 ADMISSION DATE:  05/17/2019, CONSULTATION DATE: 05/17/2019 REFERRING MD: Janetta Hora, CHIEF COMPLAINT: Chest pain  Brief History   68 year old gentleman admitted from pulmonary clinic to the emergency room for chest pain chest x-ray in pulmonary clinic revealing pneumomediastinum following bronchoscopy on 05/16/2019.  History of present illness   This is a 68 year old gentleman with a past medical history hypertension, GERD, former smoker presented to the emergency department initially on May 08, 2019 with evaluation of chest pain shortness of breath.  He had CT scan imaging at the time which revealed an incidental right-sided hilar mass with mediastinal adenopathy.  He was referred to the pulmonary clinic for evaluation.  I completed a telemetry visit a week prior to discuss risk-benefit alternatives of proceeding with outpatient bronchoscopy.  Patient was scheduled for 05/16/2019 for outpatient video bronchoscopy with endobronchial ultrasound-guided transbronchial needle aspirations.  Please see separate procedure note documented this day.  Postprocedural chest x-ray prior to discharge from PACU was normal with no evidence of pneumothorax.  1 day post procedure patient developed chest pain.  He was seen in the office today by Derl Barrow, NP.  He stated last night around 7:53 PM he developed retrosternal chest pain.  He took pain medication around 3 AM and slept for only about an hour.  He did have some slight sore throat and cough.  Had no hemoptysis.  He described the pain as 5 out of 10 with his cough.  He also had some difficulty with deep breathing.  Chest x-ray showed pneumomediastinum with subcu emphysema extending into the right neck.  Patient was placed on oxygen in the office and recommended admission to the hospital for close observation.  Pulmonary appreciates assistance of the internal medicine service for admitting patient to  the hospital.  We will follow along closely with you.  Past Medical History   Past Medical History:  Diagnosis Date  . Anemia    low iron  . BPH (benign prostatic hypertrophy)   . Dyspnea    "a little bit" with exertion  . Frequency of urination   . GERD (gastroesophageal reflux disease)    occasionally uses tums or 1 tbsp vinager  . History of bladder stone   . History of kidney stones   . Hypertension   . Nocturia   . Pneumonia   . Wears glasses      Significant Hospital Events     Consults:  Pulmonary critical care  Procedures:  05/16/2019: Video bronchoscopy with endobronchial ultrasound-guided transbronchial needle aspirations  Significant Diagnostic Tests:  05/16/2019 pathology: Pending  Noncontrasted CT chest 05/17/2019: Pending  Micro Data:  None  Antimicrobials:  None  Interim history/subjective:  HPI as above  Objective   Blood pressure (!) 151/74, pulse 73, temperature 98.3 F (36.8 C), temperature source Oral, resp. rate 14, SpO2 97 %.       No intake or output data in the 24 hours ending 05/17/19 1712 There were no vitals filed for this visit.  Examination: General: Elderly male, resting in bed, no acute distress occasional cough HENT: NCAT, sclera clear, pupils reactive, tracking appropriately Lungs: Clear to auscultation bilaterally, no crackles no wheeze Cardiovascular: Regular rate and rhythm, S1-S2 Abdomen: Soft, nontender nondistended bowel sounds present Extremities: No significant edema Neuro: Alert oriented following commands no focal deficit GU: Deferred  Resolved Hospital Problem list     Assessment & Plan:   Chest pain Secondary pneumomediastinum and  pneumopericardium following video bronchoscopy with endobronchial ultrasound and transbronchial needle aspiration biopsies Right hilar mass, mediastinal adenopathy, pathology pending -This is a known documented complication of bronchoscopy. -There is a higher risk for the  development of pneumomediastinum as well as pneumothorax following bronchoscopy with positive pressure ventilation and endotracheal intubation -We will obtain noncontrasted CT of the chest for further evaluation and delineation of extent of pneumomediastinum. - We will place patient on nasal cannula O2 to maintain sats at 100% to help with nitrogen washout. - Admitted to the hospital for close observation. - Placed on telemetry to observe for any cardiac rhythm changes due to having pneumopericardium - No Incentive Spirometry, no Flutter  - I really appreciate the help from the IM service.   Best practice:  Diet: Regular  Pain/Anxiety/Delirium protocol (if indicated): NA VAP protocol (if indicated): NA DVT prophylaxis: NA GI prophylaxis: NA Glucose control: NA Mobility: Up in char  Code Status: FULL Family Communication: Discussed with patient Disposition: Medicine floor, Tele   Labs   CBC: Recent Labs  Lab 05/17/19 1605  WBC 13.3*  NEUTROABS 10.1*  HGB 13.1  HCT 40.1  MCV 88.3  PLT 161    Basic Metabolic Panel: Recent Labs  Lab 05/17/19 1605  NA 141  K 4.0  CL 107  CO2 24  GLUCOSE 111*  BUN 20  CREATININE 0.97  CALCIUM 9.1   GFR: Estimated Creatinine Clearance: 87.5 mL/min (by C-G formula based on SCr of 0.97 mg/dL). Recent Labs  Lab 05/17/19 1605  WBC 13.3*    Liver Function Tests: No results for input(s): AST, ALT, ALKPHOS, BILITOT, PROT, ALBUMIN in the last 168 hours. No results for input(s): LIPASE, AMYLASE in the last 168 hours. No results for input(s): AMMONIA in the last 168 hours.  ABG    Component Value Date/Time   TCO2 26 05/08/2019 1738     Coagulation Profile: Recent Labs  Lab 05/16/19 1100  INR 1.0    Cardiac Enzymes: No results for input(s): CKTOTAL, CKMB, CKMBINDEX, TROPONINI in the last 168 hours.  HbA1C: No results found for: HGBA1C  CBG: No results for input(s): GLUCAP in the last 168 hours.  Review of Systems:    Review of Systems  Constitutional: Negative for chills, fever, malaise/fatigue and weight loss.  HENT: Negative for hearing loss, sore throat and tinnitus.   Eyes: Negative for blurred vision and double vision.  Respiratory: Positive for cough and shortness of breath. Negative for hemoptysis, sputum production, wheezing and stridor.   Cardiovascular: Positive for chest pain. Negative for palpitations, orthopnea, leg swelling and PND.  Gastrointestinal: Negative for abdominal pain, constipation, diarrhea, heartburn, nausea and vomiting.  Genitourinary: Negative for dysuria, hematuria and urgency.  Musculoskeletal: Negative for joint pain and myalgias.  Skin: Negative for itching and rash.  Neurological: Negative for dizziness, tingling, weakness and headaches.  Endo/Heme/Allergies: Negative for environmental allergies. Does not bruise/bleed easily.  Psychiatric/Behavioral: Negative for depression. The patient is not nervous/anxious and does not have insomnia.   All other systems reviewed and are negative.    Past Medical History  He,  has a past medical history of Anemia, BPH (benign prostatic hypertrophy), Dyspnea, Frequency of urination, GERD (gastroesophageal reflux disease), History of bladder stone, History of kidney stones, Hypertension, Nocturia, Pneumonia, and Wears glasses.   Surgical History    Past Surgical History:  Procedure Laterality Date  . CYSTO/  LEFT URETEROSCOPIC STONE EXTRACTION/  BLADDER  STONE EXTRACTION  02-15-2009  . CYSTOSCOPY WITH LITHOLAPAXY N/A 09/29/2013  Procedure: CYSTOSCOPY WITH LITHOLAPAXY;  Surgeon: Franchot Gallo, MD;  Location: [facility];  Service: Urology;  Laterality: N/A;  . LUMBAR [place] SURGERY  09-19-2000   LEFT  L4 -- L5  . REPAIR RIGHT HAND SOFT TISSUE STRUCTURES  02-24-2000   WORK INJURY /  PARTIAL AMPUTATION RIGHT INDEX FINGER  . TRANSURETHRAL RESECTION OF PROSTATE N/A 09/29/2013   Procedure: TRANSURETHRAL RESECTION OF  THE PROSTATE WITH GYRUS INSTRUMENTS;  Surgeon: Franchot Gallo, MD;  Location: [facility];  Service: Urology;  Laterality: N/A;  . VIDEO BRONCHOSCOPY WITH ENDOBRONCHIAL ULTRASOUND N/A 05/16/2019   Procedure: VIDEO BRONCHOSCOPY WITH ENDOBRONCHIAL ULTRASOUND;  Surgeon: Garner Nash, DO;  Location: [place];  Service: Thoracic;  Laterality: N/A;     Social History   reports that he quit smoking about 35 years ago. His smoking use included cigarettes. He has a 20.00 pack-year smoking history. He has never used smokeless tobacco. He reports current alcohol use. He reports that he does not use drugs.   Family History   His family history is not on file.   Allergies No Known Allergies   Home Medications  Prior to Admission medications   Medication Sig Start Date End Date Taking? Authorizing Provider  Ascorbic Acid (VITAMIN C) 1000 MG tablet Take 1,000 mg by mouth daily.   Yes [provider]  Cholecalciferol (VITAMIN D3 PO) Take 1 tablet by mouth daily.   Yes [provider]  metoprolol succinate (TOPROL-XL) 50 MG 24 hr tablet Take 50 mg by mouth daily. 03/14/19  Yes [provider]  omeprazole (PRILOSEC) 40 MG capsule Take 40 mg by mouth daily before breakfast.  08/28/14  Yes [provider]  oxyCODONE-acetaminophen (PERCOCET) 5-325 MG tablet Take 1 tablet by mouth every 6 (six) hours as needed. 05/08/19  Yes Margarita Mail, PA-C     Garner Nash, DO [facility] 05/17/2019 5:05 PM  Personal pager: [phone] If unanswered, please page CCM On-call: [phone]

## 2019-05-17 NOTE — ED Provider Notes (Signed)
[facility] EMERGENCY DEPARTMENT Provider Note   CSN: [id number] Arrival date & time: 05/17/19  1522     History   Chief Complaint No chief complaint on file.   HPI Henry [name] is a 68 y.o. male who presents with pneumomediastinum. PMH significant for recent diagnosis of a hilar mass, former smoker, GERD, HTN.  He went for a bronchoscopy yesterday to biopsy the mass.  He was able to go home after the biopsy and states that overnight he started to develop some substernal chest pain.  He reports associated dry cough that worsens the chest pain and he has trouble taking deep breaths.  This morning symptoms were persistent at rest and therefore he called his doctor's office and they wanted him to come in for a chest x-ray.  Chest x-ray was remarkable for pneumomediastinum with extensive subcutaneous emphysema over the right chest wall and in the soft tissues of the right neck.  There is also increasing airspace opacities which could represent atelectasis versus pneumonia.  Patient denies any fever, hemoptysis, shortness of breath, abdominal pain, N/V.  He was tested for COVID several days ago and was negative.   HPI  Past Medical History:  Diagnosis Date  . Anemia    low iron  . BPH (benign prostatic hypertrophy)   . Dyspnea    "a little bit" with exertion  . Frequency of urination   . GERD (gastroesophageal reflux disease)    occasionally uses tums or 1 tbsp vinager  . History of bladder stone   . History of kidney stones   . Hypertension   . Nocturia   . Pneumonia   . Wears glasses     Patient Active Problem List   Diagnosis Date Noted  . Pneumomediastinum ([place]) 05/17/2019  . Hilar mass 05/16/2019  . Mediastinal adenopathy 05/16/2019  . Essential hypertension 09/01/2014  . Dyspnea 08/31/2014  . Hypertrophy of prostate with urinary obstruction and other lower urinary tract symptoms (LUTS) 09/29/2013  . BENIGN PROSTATIC HYPERTROPHY, HX OF 01/05/2008  .  INTERNAL HEMORRHOIDS 09/23/2000  . ESOPHAGITIS, REFLUX 09/23/2000  . ESOPHAGEAL STRICTURE 09/23/2000  . GERD 09/23/2000  . GASTRITIS, ACUTE 09/23/2000  . DUODENITIS, WITHOUT HEMORRHAGE 09/23/2000  . HIATAL HERNIA 09/23/2000  . DIVERTICULOSIS, COLON 09/23/2000    Past Surgical History:  Procedure Laterality Date  . CYSTO/  LEFT URETEROSCOPIC STONE EXTRACTION/  BLADDER  STONE EXTRACTION  02-15-2009  . CYSTOSCOPY WITH LITHOLAPAXY N/A 09/29/2013   Procedure: CYSTOSCOPY WITH LITHOLAPAXY;  Surgeon: Franchot Gallo, MD;  Location[facility];  Service: Urology;  Laterality: N/A;  . LUMBAR [place] SURGERY  09-19-2000   LEFT  L4 -- L5  . REPAIR RIGHT HAND SOFT TISSUE STRUCTURES  02-24-2000   WORK INJURY /  PARTIAL AMPUTATION RIGHT INDEX FINGER  . TRANSURETHRAL RESECTION OF PROSTATE N/A 09/29/2013   Procedure: TRANSURETHRAL RESECTION OF THE PROSTATE WITH GYRUS INSTRUMENTS;  Surgeon: Franchot Gallo, MD;  Location: [facility];  Service: Urology;  Laterality: N/A;  . VIDEO BRONCHOSCOPY WITH ENDOBRONCHIAL ULTRASOUND N/A 05/16/2019   Procedure: VIDEO BRONCHOSCOPY WITH ENDOBRONCHIAL ULTRASOUND;  Surgeon: Garner Nash, DO;  Location: [facility];  Service: Thoracic;  Laterality: N/A;        Home Medications    Prior to Admission medications   Medication Sig Start Date End Date Taking? Authorizing Provider  Ascorbic Acid (VITAMIN C) 1000 MG tablet Take 1,000 mg by mouth daily.    [provider]  Cholecalciferol (VITAMIN D3  PO) Take 1 tablet by mouth daily.    [provider]  metoprolol succinate (TOPROL-XL) 50 MG 24 hr tablet Take 50 mg by mouth daily. 03/14/19   [provider]  omeprazole (PRILOSEC) 40 MG capsule Take 40 mg by mouth daily before breakfast.  08/28/14   [provider]  oxyCODONE-acetaminophen (PERCOCET) 5-325 MG tablet Take 1 tablet by mouth every 6 (six) hours as needed. 05/08/19   Margarita Mail, PA-C    Family  History No family history on file.  Social History Social History   Tobacco Use  . Smoking status: Former Smoker    Packs/day: 1.00    Years: 20.00    Pack years: 20.00    Types: Cigarettes    Quit date: 09/29/1983    Years since quitting: 35.6  . Smokeless tobacco: Never Used  Substance Use Topics  . Alcohol use: Yes    Comment: rare  . Drug use: No     Allergies   Patient has no known allergies.   Review of Systems Review of Systems  Constitutional: Negative for fever.  Respiratory: Positive for cough. Negative for shortness of breath.   Cardiovascular: Positive for chest pain. Negative for palpitations and leg swelling.  Gastrointestinal: Negative for abdominal pain, nausea and vomiting.  All other systems reviewed and are negative.    Physical Exam Updated Vital Signs BP (!) 151/74 (BP Location: Right Arm)   Pulse 73   Temp 98.3 F (36.8 C) (Oral)   Resp 14   SpO2 97%   Physical Exam Vitals signs and nursing note reviewed.  Constitutional:      General: He is not in acute distress.    Appearance: Normal appearance. He is well-developed. He is not ill-appearing.  HENT:     Head: Normocephalic and atraumatic.  Eyes:     General: No scleral icterus.       Right eye: No discharge.        Left eye: No discharge.     Conjunctiva/sclera: Conjunctivae normal.     Pupils: Pupils are equal, round, and reactive to light.  Neck:     Musculoskeletal: Normal range of motion.  Cardiovascular:     Rate and Rhythm: Normal rate and regular rhythm.  Pulmonary:     Effort: Pulmonary effort is normal. No respiratory distress.     Breath sounds: Normal breath sounds.  Abdominal:     General: There is no distension.     Palpations: Abdomen is soft.     Tenderness: There is no abdominal tenderness.  Skin:    General: Skin is warm and dry.  Neurological:     Mental Status: He is alert and oriented to person, place, and time.  Psychiatric:        Behavior: Behavior  normal.      ED Treatments / Results  Labs (all labs ordered are listed, but only abnormal results are displayed) Labs Reviewed  SARS CORONAVIRUS 2  BASIC METABOLIC PANEL  CBC WITH DIFFERENTIAL/PLATELET    EKG None  Radiology Dg Chest 2 View  Result Date: 05/17/2019 CLINICAL DATA:  Chest discomfort, history of hypertension, recent bronchoscopy EXAM: CHEST - 2 VIEW COMPARISON:  Radiograph May 16, 2019, CTA chest May 07, 2021 FINDINGS: Nodular density in the right hilum compatible with a right hilar mass present on prior exams. There is increasing patchy opacities with a basilar and peripheral predominance are out both lungs with scattered areas of air bronchograms. Furthermore there is extensive subcutaneous  emphysema over the right chest wall and in the soft tissues of the base of the right neck a pneumomediastinum is present as well with crescentic air outlining portion of the aorta. No pneumothorax. No effusion. Cardiomediastinal contours are otherwise unremarkable. Airways patent. Degenerative changes are present in the and imaged spine and shoulders. IMPRESSION: 1. Pneumomediastinum and extensive subcutaneous emphysema over the right chest wall and in the soft tissues of the right neck in the setting of recent bronchoscopy. 2. Increasing airspace opacities could reflect worsening atelectasis versus developing pneumonia. These results were called by telephone at the time of interpretation on 05/17/2019 at 2:24 pm to [facility] NP, who verbally acknowledged these results. Electronically Signed   By: Lovena Le M.D.   On: 05/17/2019 14:25   Dg Chest Port 1 View  Result Date: 05/16/2019 CLINICAL DATA:  Bronchoscopy. EXAM: PORTABLE CHEST 1 VIEW COMPARISON:  CT 05/08/2019.  Chest x-ray 08/31/2014. FINDINGS: Mediastinum and hilar structures are unchanged, again adenopathy may be present. Stable cardiomegaly. Right infrahilar mass best identified by prior CT. No pleural effusion. No  evidence of pneumothorax post bronchoscopy. IMPRESSION: No evidence of pneumothorax post bronchoscopy. Right infrahilar mass best identified by prior CT. Electronically Signed   By: Marcello Moores  Register   On: 05/16/2019 15:32    Procedures Procedures (including critical care time)  Medications Ordered in ED Medications - No data to display   Initial Impression / Assessment and Plan / ED Course  I have reviewed the triage vital signs and the nursing notes.  Pertinent labs & imaging results that were available during my care of the patient were reviewed by me and considered in my medical decision making (see chart for details).  68 year old male presents with chest pain and is found to have pneumomediastinum in the setting of recent bronchoscopy yesterday. BP is mildly elevated but otherwise vitals are normal. He is in NAD. He was started on 2L of O2 via [place]. Labs and COVID swab ordered. Discussed with IM team who will admit.  Final Clinical Impressions(s) / ED Diagnoses   Final diagnoses:  Pneumomediastinum [facility])    ED Discharge Orders    None       Recardo Evangelist, PA-C 05/17/19 [place], [place], DO 05/17/19 1702

## 2019-05-17 NOTE — Progress Notes (Addendum)
_0  ID: Henry [name], male    DOB: Sep 16, 1951, 68 y.o.   MRN: [id number]  Chief Complaint  Patient presents with   Follow-up    center chest pain with exertion - not new pain - has had it about 1 year with exertion    Referring provider: No ref. provider found  HPI: 68 year old male, former smoker. PMH significant for hilar mass, mediastinal adenopathy, dyspnea, esophageal stricture, GERD, HTN. Patient of Dr. Valeta Harms, seen for consult on 05/10/19. Seen in ED on 05/08/19  and found to have incidental 3.4cm right sided hilar lung mass with associated mediastinal adenopathy. Patient had EBUS bronchoscopy on 05/16/19 with Dr. Valeta Harms for biopsies, brushings and bronchoalveolar lavage.   05/17/2019 Patient presents today for acute visit with complaints of chest pains located center of chest near sternum since bronchoscopy yesterday. Pain came on last night around 7-8pm. State that it is more painful with deep breath and cough. Took pain medication at 3am and slept for 1 hours. Today he has a slight cough and sore throat. Cough is dry, no hemoptysis. Rating chest pain 5/10 with cough. Occasional difficulty taking a deep breath. Winded with activity. Stat CXR today showed Pneumomediastinum and extensive subcutaneous emphysema over the right chest wall and in the soft tissues of the right neck in the setting of recent bronchoscopy. Patient and family update on CXR results my myself and Dr. Valeta Harms, recommending hospital admission to monitor. 2L oxygen placed via nasal cannula.    No Known Allergies  Immunization History  Administered Date(s) Administered   Tdap 08/01/2014    Past Medical History:  Diagnosis Date   Anemia    low iron   BPH (benign prostatic hypertrophy)    Dyspnea    "a little bit" with exertion   Frequency of urination    GERD (gastroesophageal reflux disease)    occasionally uses tums or 1 tbsp vinager   History of bladder stone    History of kidney stones     Hypertension    Nocturia    Pneumonia    Wears glasses     Tobacco History: Social History   Tobacco Use  Smoking Status Former Smoker   Packs/day: 1.00   Years: 20.00   Pack years: 20.00   Types: Cigarettes   Quit date: 09/29/1983   Years since quitting: 35.6  Smokeless Tobacco Never Used   Counseling given: Not Answered   Outpatient Medications Prior to Visit  Medication Sig Dispense Refill   Ascorbic Acid (VITAMIN C) 1000 MG tablet Take 1,000 mg by mouth daily.     Cholecalciferol (VITAMIN D3 PO) Take 1 tablet by mouth daily.     metoprolol succinate (TOPROL-XL) 50 MG 24 hr tablet Take 50 mg by mouth daily.     omeprazole (PRILOSEC) 40 MG capsule Take 40 mg by mouth daily before breakfast.   1   oxyCODONE-acetaminophen (PERCOCET) 5-325 MG tablet Take 1 tablet by mouth every 6 (six) hours as needed. 15 tablet 0   ondansetron (ZOFRAN ODT) 4 MG disintegrating tablet 51m ODT q4 hours prn nausea/vomit 4 tablet 0   No facility-administered medications prior to visit.     Review of Systems  Review of Systems  Constitutional: Negative.   Respiratory: Positive for cough and shortness of breath. Negative for chest tightness and wheezing.   Cardiovascular: Positive for chest pain.  Gastrointestinal: Negative.  Negative for abdominal pain.    Physical Exam  BP (!) 162/78 (BP Location: Left Arm,  Patient Position: Sitting, Cuff Size: Normal)    Pulse 80    Temp 98.2 F (36.8 C)    Ht _0  (1.803 m)    Wt 212 lb (96.2 kg)    SpO2 98%    BMI 29.57 kg/m  Physical Exam Constitutional:      General: He is not in acute distress.    Appearance: Normal appearance. He is obese. He is not ill-appearing.  HENT:     Head: Normocephalic and atraumatic.     Mouth/Throat:     Mouth: Mucous membranes are moist.     Pharynx: Oropharynx is clear.  Neck:     Musculoskeletal: Normal range of motion and neck supple.  Cardiovascular:     Rate and Rhythm: Normal rate.    Pulmonary:     Effort: No respiratory distress.     Breath sounds: No wheezing.     Comments: Diminished, crackles right base Abdominal:     General: There is distension.     Palpations: Abdomen is soft.     Tenderness: There is no guarding or rebound.  Musculoskeletal: Normal range of motion.  Skin:    General: Skin is warm and dry.  Neurological:     General: No focal deficit present.     Mental Status: He is alert and oriented to person, place, and time. Mental status is at baseline.  Psychiatric:        Mood and Affect: Mood normal.        Behavior: Behavior normal.        Thought Content: Thought content normal.        Judgment: Judgment normal.      Lab Results:  CBC    Component Value Date/Time   WBC 7.1 05/08/2019 1713   RBC 4.77 05/08/2019 1713   HGB 14.6 05/08/2019 1738   HCT 43.0 05/08/2019 1738   PLT 183 05/08/2019 1713   MCV 89.5 05/08/2019 1713   MCH 28.9 05/08/2019 1713   MCHC 32.3 05/08/2019 1713   RDW 14.1 05/08/2019 1713   LYMPHSABS 1.7 05/08/2019 1713   MONOABS 0.7 05/08/2019 1713   EOSABS 0.7 (H) 05/08/2019 1713   BASOSABS 0.1 05/08/2019 1713    BMET    Component Value Date/Time   NA 140 05/08/2019 1738   K 3.7 05/08/2019 1738   CL 103 05/08/2019 1738   CO2 24 05/08/2019 1713   GLUCOSE 93 05/08/2019 1738   BUN 20 05/08/2019 1738   CREATININE 0.80 05/08/2019 1738   CALCIUM 9.1 05/08/2019 1713   GFRNONAA >60 05/08/2019 1713   GFRAA >60 05/08/2019 1713    BNP No results found for: BNP  ProBNP No results found for: PROBNP  Imaging: Dg Chest 2 View  Result Date: 05/17/2019 CLINICAL DATA:  Chest discomfort, history of hypertension, recent bronchoscopy EXAM: CHEST - 2 VIEW COMPARISON:  Radiograph May 16, 2019, CTA chest May 07, 2021 FINDINGS: Nodular density in the right hilum compatible with a right hilar mass present on prior exams. There is increasing patchy opacities with a basilar and peripheral predominance are out both lungs  with scattered areas of air bronchograms. Furthermore there is extensive subcutaneous emphysema over the right chest wall and in the soft tissues of the base of the right neck a pneumomediastinum is present as well with crescentic air outlining portion of the aorta. No pneumothorax. No effusion. Cardiomediastinal contours are otherwise unremarkable. Airways patent. Degenerative changes are present in the and imaged spine and shoulders. IMPRESSION: 1.  Pneumomediastinum and extensive subcutaneous emphysema over the right chest wall and in the soft tissues of the right neck in the setting of recent bronchoscopy. 2. Increasing airspace opacities could reflect worsening atelectasis versus developing pneumonia. These results were called by telephone at the time of interpretation on 05/17/2019 at 2:24 pm to [facility] NP, who verbally acknowledged these results. Electronically Signed   By: Lovena Le M.D.   On: 05/17/2019 14:25   Dg Chest Port 1 View  Result Date: 05/16/2019 CLINICAL DATA:  Bronchoscopy. EXAM: PORTABLE CHEST 1 VIEW COMPARISON:  CT 05/08/2019.  Chest x-ray 08/31/2014. FINDINGS: Mediastinum and hilar structures are unchanged, again adenopathy may be present. Stable cardiomegaly. Right infrahilar mass best identified by prior CT. No pleural effusion. No evidence of pneumothorax post bronchoscopy. IMPRESSION: No evidence of pneumothorax post bronchoscopy. Right infrahilar mass best identified by prior CT. Electronically Signed   By: Marcello Moores  Register   On: 05/16/2019 15:32   Ct Angio Chest/abd/pel For Dissection W And/or Wo Contrast  Result Date: 05/08/2019 CLINICAL DATA:  Back pain, shortness of breath EXAM: CT ANGIOGRAPHY CHEST, ABDOMEN AND PELVIS TECHNIQUE: Multidetector CT imaging through the chest, abdomen and pelvis was performed using the standard protocol during bolus administration of intravenous contrast. Multiplanar reconstructed images and MIPs were obtained and reviewed to evaluate the  vascular anatomy. CONTRAST:  159m OMNIPAQUE IOHEXOL 350 MG/ML SOLN COMPARISON:  08/12/2011 and previous FINDINGS: CTA CHEST FINDINGS Cardiovascular: Heart size upper limits normal. RV is nondilated. Satisfactory opacification of pulmonary arteries noted, and there is no evidence of pulmonary emboli. Scattered coronary calcifications. Good contrast opacification of the thoracic aorta. 4.1 cm aneurysmal dilatation of the ascending thoracic aorta. Classic 3 vessel brachiocephalic arterial origin anatomy without proximal stenosis. No dissection or stenosis. Minimal scattered plaque in the descending segment. Mediastinum/Nodes: 1.2 cm precarinal lymph node. 1.5 cm right hilar lymph node. Lungs/Pleura: No pleural effusion. No pneumothorax. 3.4 x 2.1 cm right infrahilar mass abutting the hilum, new since previous. Small subpleural blebs in the lung apices. Musculoskeletal: Mild spondylitic changes in the lower thoracic spine. No fracture or worrisome bone lesion. Review of the MIP images confirms the above findings. CTA ABDOMEN AND PELVIS FINDINGS VASCULAR Aorta: Mild calcified atheromatous plaque in the infrarenal segment. No aneurysm, dissection, or stenosis. Celiac: Patent without evidence of aneurysm, dissection, vasculitis or significant stenosis. SMA: Patent without evidence of aneurysm, dissection, vasculitis or significant stenosis. Renals: Duplicated left, superior dominant, both patent. Duplicated right, superior dominant, both patent. IMA: Patent without evidence of aneurysm, dissection, vasculitis or significant stenosis. Inflow: Mildly tortuous iliac arterial system without aneurysm or stenosis. Veins: No obvious venous abnormality within the limitations of this arterial phase study. Review of the MIP images confirms the above findings. NON-VASCULAR Hepatobiliary: No focal liver abnormality is seen. No gallstones, gallbladder wall thickening, or biliary dilatation. Pancreas: Unremarkable. No pancreatic ductal  dilatation or surrounding inflammatory changes. Spleen: Normal in size without focal abnormality. Adrenals/Urinary Tract: Normal adrenals. No renal mass or hydronephrosis. Urinary bladder incompletely distended. Stomach/Bowel: Stomach is nondistended. Small bowel decompressed. Normal appendix. The colon is nondilated with scattered distal descending and sigmoid diverticula; no adjacent inflammatory/edematous change or abscess. Lymphatic: No abdominal or pelvic adenopathy. Reproductive: Prostate enlargement with central coarse calcifications. Other: No ascites. No free air. Musculoskeletal: Spondylitic changes L4-5. No fracture or worrisome bone lesion. Review of the MIP images confirms the above findings. IMPRESSION: 1. No acute PE or thoracic aortic dissection. 2. 3.4 cm right infrahilar mass, suspicious for primary bronchogenic carcinoma.  Follow-up recommended. 3. Mild right hilar and mediastinal adenopathy, possibly metastatic disease. 4. 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 Recommend annual imaging followup by CTA or MRA. This recommendation follows 2010 ACCF/AHA/AATS/ACR/ASA/SCA/SCAI/SIR/STS/SVM Guidelines for the Diagnosis and Management of Patients with Thoracic Aortic Disease. Circulation. 2010; 121: e266-e369 5. Coronary and aortic Atherosclerosis (ICD10-I70.0). 6. Descending and sigmoid diverticulosis. 7. Prostatomegaly. 8. Lumbar degenerative changes as above. Electronically Signed   By: Lucrezia Europe M.D.   On: 05/08/2019 20:45     Assessment & Plan:   Pneumomediastinum ([place]) - Developed worsening chest pain after bronchoscopy 8/4 - CXR today showed Pneumomediastinum and extensive subcutaneous emphysema over the right chest wall and in the soft tissues of the right neck. Increasing airspace opacities could reflect worsening atelectasis versus developing pneumonia - Dr. Valeta Harms recommending hospital admission to monitor, 2L oxygen placed  - No beds available at [facility], patient advised to go  via EMS to ED. Patient decline EMS and wife will transport him. He is in stable condition at present. Will travel with home oxygen tank.     Martyn Ehrich, NP 05/17/2019

## 2019-05-17 NOTE — Telephone Encounter (Signed)
Called and spoke with pt stating to him that if he was having active chest pains that he needed to go to the ER. Pt said that the pains in his chest happens when he exerts himself and also could be due to the biopsies he has had. Stated to pt that we could get him scheduled for a televisit to further evaluate and pt verbalized understanding. Pt has been scheduled for visit with EW today at 12pm. Nothing further needed.

## 2019-05-17 NOTE — Telephone Encounter (Signed)
Received a msg from [place] to schedule Henry [name] /Dr. Julien Nordmann on 811 at 2:15pm w/labs at 1:45pm. Pt is currently in the hospital.

## 2019-05-17 NOTE — Telephone Encounter (Signed)
If patient is having active chest pain please go to the ED. If he is stable please schedule e-visit with Beth this afternoon. Thanks.

## 2019-05-17 NOTE — ED Notes (Signed)
ED TO INPATIENT HANDOFF REPORT  ED Nurse Name and Phone #: mike rn  S Name/Age/Gender Henry [name] 68 y.o. male Room/Bed: 045C/045C  Code Status   Code Status: Full Code  Home/SNF/Other Home Patient oriented to: self, place, time and situation Is this baseline? Yes   Triage Complete: Triage complete  Chief Complaint Air in lungs/Sent by dr  Triage Note Patient sent from MD office to D for air in lungs following right lung biopsy yesterday. Patient reports that he was placed on oxygen in office due to SOB. Patient alert and oriented on arrival. States pain with inspiration   Allergies No Known Allergies  Level of Care/Admitting Diagnosis ED Disposition    ED Disposition Condition [facility]: [place] [[id number]]  Level of Care: Telemetry Medical [104]  Covid Evaluation: Asymptomatic Screening Protocol (No Symptoms)  Diagnosis: Pneumomediastinum ([place]) [[id number]]  Admitting Physician: Bosie Helper  Attending Physician: Lucious Groves [[id number]]  Estimated length of stay: past midnight tomorrow  Certification:: I certify this patient will need inpatient services for at least 2 midnights  PT Class (Do Not Modify): Inpatient [101]  PT Acc Code (Do Not Modify): Private [1]       B Medical/Surgery History Past Medical History:  Diagnosis Date  . Anemia    low iron  . BPH (benign prostatic hypertrophy)   . Dyspnea    "a little bit" with exertion  . Frequency of urination   . GERD (gastroesophageal reflux disease)    occasionally uses tums or 1 tbsp vinager  . History of bladder stone   . History of kidney stones   . Hypertension   . Nocturia   . Pneumonia   . Wears glasses    Past Surgical History:  Procedure Laterality Date  . CYSTO/  LEFT URETEROSCOPIC STONE EXTRACTION/  BLADDER  STONE EXTRACTION  02-15-2009  . CYSTOSCOPY WITH LITHOLAPAXY N/A 09/29/2013   Procedure: CYSTOSCOPY WITH LITHOLAPAXY;  Surgeon: Franchot Gallo, MD;  [facility];  Service: Urology;  Laterality: N/A;  . LUMBAR [place] SURGERY  09-19-2000   LEFT  L4 -- L5  . REPAIR RIGHT HAND SOFT TISSUE STRUCTURES  02-24-2000   WORK INJURY /  PARTIAL AMPUTATION RIGHT INDEX FINGER  . TRANSURETHRAL RESECTION OF PROSTATE N/A 09/29/2013   Procedure: TRANSURETHRAL RESECTION OF THE PROSTATE WITH GYRUS INSTRUMENTS;  Surgeon: Franchot Gallo, MD;  Location: [facility];  Service: Urology;  Laterality: N/A;  . VIDEO BRONCHOSCOPY WITH ENDOBRONCHIAL ULTRASOUND N/A 05/16/2019   Procedure: VIDEO BRONCHOSCOPY WITH ENDOBRONCHIAL ULTRASOUND;  Surgeon: Garner Nash, DO;  Location: [facility];  Service: Thoracic;  Laterality: N/A;     A IV Location/Drains/Wounds Patient Lines/Drains/Airways Status   Active Line/Drains/Airways    Name:   Placement date:   Placement time:   Site:   Days:   Incision 09/29/13 Perineum   09/29/13    1243     2056   Incision (Closed) 05/16/19 N/A   05/16/19    1326     1          Intake/Output Last 24 hours No intake or output data in the 24 hours ending 05/17/19 1800  Labs/Imaging Results for orders placed or performed during the hospital encounter of 05/17/19 (from the past 48 hour(s))  Basic metabolic panel     Status: Abnormal   Collection Time: 05/17/19  4:05 PM  Result Value Ref Range   Sodium  141 135 - 145 mmol/L   Potassium 4.0 3.5 - 5.1 mmol/L   Chloride 107 98 - 111 mmol/L   CO2 24 22 - 32 mmol/L   Glucose, Bld 111 (H) 70 - 99 mg/dL   BUN 20 8 - 23 mg/dL   Creatinine, Ser 0.97 0.61 - 1.24 mg/dL   Calcium 9.1 8.9 - 10.3 mg/dL   GFR calc non Af Amer >60 >60 mL/min   GFR calc Af Amer >60 >60 mL/min   Anion gap 10 5 - 15    Comment: Performed at [facility] [address], [place], [place] [address]  CBC with Differential     Status: Abnormal   Collection Time: 05/17/19  4:05 PM  Result Value Ref Range   WBC 13.3 (H) 4.0 - 10.5 K/uL   RBC 4.54 4.22 - 5.81  MIL/uL   Hemoglobin 13.1 13.0 - 17.0 g/dL   HCT 40.1 39.0 - 52.0 %   MCV 88.3 80.0 - 100.0 fL   MCH 28.9 26.0 - 34.0 pg   MCHC 32.7 30.0 - 36.0 g/dL   RDW 14.5 11.5 - 15.5 %   Platelets 181 150 - 400 K/uL   nRBC 0.0 0.0 - 0.2 %   Neutrophils Relative % 77 %   Neutro Abs 10.1 (H) 1.7 - 7.7 K/uL   Lymphocytes Relative 14 %   Lymphs Abs 1.8 0.7 - 4.0 K/uL   Monocytes Relative 8 %   Monocytes Absolute 1.1 (H) 0.1 - 1.0 K/uL   Eosinophils Relative 1 %   Eosinophils Absolute 0.2 0.0 - 0.5 K/uL   Basophils Relative 0 %   Basophils Absolute 0.1 0.0 - 0.1 K/uL   Immature Granulocytes 0 %   Abs Immature Granulocytes 0.04 0.00 - 0.07 K/uL    Comment: Performed at [facility] [address], [place], [place] [address]   Dg Chest 2 View  Result Date: 05/17/2019 CLINICAL DATA:  Chest discomfort, history of hypertension, recent bronchoscopy EXAM: CHEST - 2 VIEW COMPARISON:  Radiograph May 16, 2019, CTA chest May 07, 2021 FINDINGS: Nodular density in the right hilum compatible with a right hilar mass present on prior exams. There is increasing patchy opacities with a basilar and peripheral predominance are out both lungs with scattered areas of air bronchograms. Furthermore there is extensive subcutaneous emphysema over the right chest wall and in the soft tissues of the base of the right neck a pneumomediastinum is present as well with crescentic air outlining portion of the aorta. No pneumothorax. No effusion. Cardiomediastinal contours are otherwise unremarkable. Airways patent. Degenerative changes are present in the and imaged spine and shoulders. IMPRESSION: 1. Pneumomediastinum and extensive subcutaneous emphysema over the right chest wall and in the soft tissues of the right neck in the setting of recent bronchoscopy. 2. Increasing airspace opacities could reflect worsening atelectasis versus developing pneumonia. These results were called by telephone at the time of interpretation on  05/17/2019 at 2:24 pm to [facility] NP, who verbally acknowledged these results. Electronically Signed   By: Lovena Le M.D.   On: 05/17/2019 14:25   Dg Chest Port 1 View  Result Date: 05/16/2019 CLINICAL DATA:  Bronchoscopy. EXAM: PORTABLE CHEST 1 VIEW COMPARISON:  CT 05/08/2019.  Chest x-ray 08/31/2014. FINDINGS: Mediastinum and hilar structures are unchanged, again adenopathy may be present. Stable cardiomegaly. Right infrahilar mass best identified by prior CT. No pleural effusion. No evidence of pneumothorax post bronchoscopy. IMPRESSION: No evidence of pneumothorax post  bronchoscopy. Right infrahilar mass best identified by prior CT. Electronically Signed   By: Marcello Moores  Register   On: 05/16/2019 15:32    Pending Labs Unresulted Labs (From admission, onward)    Start     Ordered   05/18/19 2725  Basic metabolic panel  Tomorrow morning,   R     05/17/19 1729   05/17/19 1726  HIV antibody (Routine Testing)  Once,   STAT     05/17/19 1729   05/17/19 1555  SARS CORONAVIRUS 2 Nasal Swab Aptima Multi Swab  (Asymptomatic/Tier 2 Patients Labs)  Once,   STAT    Question Answer Comment  Is this test for diagnosis or screening Screening   Symptomatic for COVID-19 as defined by CDC No   Hospitalized for COVID-19 No   Admitted to ICU for COVID-19 No   Previously tested for COVID-19 Yes   Resident in a congregate (group) care setting No   Employed in healthcare setting No      05/17/19 1555          Vitals/Pain Today's Vitals   05/17/19 1548 05/17/19 1549  BP: (!) 151/74   Pulse: 73   Resp: 14   Temp: 98.3 F (36.8 C)   TempSrc: Oral   SpO2: 97%   PainSc:  6     Isolation Precautions No active isolations  Medications Medications  oxyCODONE-acetaminophen (PERCOCET/ROXICET) 5-325 MG per tablet 1 tablet (has no administration in time range)  pantoprazole (PROTONIX) EC tablet 40 mg (has no administration in time range)  enoxaparin (LOVENOX) injection 40 mg (has no  administration in time range)  acetaminophen (TYLENOL) tablet 650 mg (has no administration in time range)    Or  acetaminophen (TYLENOL) suppository 650 mg (has no administration in time range)  ondansetron (ZOFRAN) tablet 4 mg (has no administration in time range)    Or  ondansetron (ZOFRAN) injection 4 mg (has no administration in time range)    Mobility walks Low fall risk   Focused Assessments Renal Assessment Handoff:  Hemodialysis Schedule: Hemodialysis Schedule: Tuesday/Thursday/Saturday Last Hemodialysis date and time: .   Restricted appendage: left arm     R Recommendations: See Admitting Provider Note  Report given to:   Additional Notes: none

## 2019-05-17 NOTE — Assessment & Plan Note (Addendum)
-   Developed worsening chest pain after bronchoscopy 8/4 - CXR today showed Pneumomediastinum and extensive subcutaneous emphysema over the right chest wall and in the soft tissues of the right neck. Increasing airspace opacities could reflect worsening atelectasis versus developing pneumonia - Dr. Valeta Harms recommending hospital admission to monitor, 2L oxygen placed  - No beds available at [facility], patient advised to go via EMS to ED. Patient decline EMS and wife will transport him. He is in stable condition at present. Will travel with home oxygen tank.

## 2019-05-17 NOTE — ED Triage Notes (Signed)
Patient sent from MD office to D for air in lungs following right lung biopsy yesterday. Patient reports that he was placed on oxygen in office due to SOB. Patient alert and oriented on arrival. States pain with inspiration

## 2019-05-17 NOTE — H&P (Signed)
Date: 05/17/2019               Patient Name:  Henry [name] MRN: [id number]  DOB: [birth year] Age / Sex: 68 y.o., male   PCP: [facility]         Medical Service: Internal Medicine Teaching Service         Attending Physician: Dr. Lucious Groves, DO    First Contact: Dr. Charleen Kirks Pager: [phone]  Second Contact: Dr. Tarri Abernethy Pager: [phone]       After Hours (After 5p/  First Contact Pager: [phone]  weekends / holidays): Second Contact Pager: [phone]   Chief Complaint: Chest Pain  History of Present Illness:   Henry [name] is a 68 y/o male with a history of recent bronchoscopy yesterday who presents to the ED for evaluation of chest pain.   Henry [name] states that he started having some back pain approximately 2 3 days ago and he had followed up with his primary care about it.  While in the ED, he had a CT pelvis to rule out dissection, and at that time found a suspicious hilar mass.  At this time his back pain resolved and he began to have chest pain.  He described the chest pain as intermittent and only with exertion.  When chest pain occurred he would be able to" work through it" and it would resolve on its own.  For example, during spin class or climbing stairs.  Denies ever having to stop activity due to chest pain.  Yesterday, he had a video bronchoscopy with Dr. Valeta Harms without any immediate complications.  Last night, while sitting on the couch he began to experience the same chest pain however with breaths, the pain got significantly worse.  Additionally he has had a dry cough since bronchoscopy but denies fever or chills.  Denies nausea, vomiting and abdominal pain.  ED Course:  Chest x-ray performed confirmed pneumomediastinum with subcutaneous emphysema as well as increasing airspace opacities.  BMP was normal with exception of an elevated glucose.  CBC was only remarkable for an elevated white blood count of 13.3.   Meds:  Current Meds  Medication Sig  .  Ascorbic Acid (VITAMIN C) 1000 MG tablet Take 1,000 mg by mouth daily.  . Cholecalciferol (VITAMIN D3 PO) Take 1 tablet by mouth daily.  . metoprolol succinate (TOPROL-XL) 50 MG 24 hr tablet Take 50 mg by mouth daily.  Marland Kitchen omeprazole (PRILOSEC) 40 MG capsule Take 40 mg by mouth daily before breakfast.   . oxyCODONE-acetaminophen (PERCOCET) 5-325 MG tablet Take 1 tablet by mouth every 6 (six) hours as needed.     Allergies: Allergies as of 05/17/2019  . (No Known Allergies)   Past Medical History:  Diagnosis Date  . Anemia    low iron  . BPH (benign prostatic hypertrophy)   . Dyspnea    "a little bit" with exertion  . Frequency of urination   . GERD (gastroesophageal reflux disease)    occasionally uses tums or 1 tbsp vinager  . History of bladder stone   . History of kidney stones   . Hypertension   . Nocturia   . Pneumonia   . Wears glasses     Family History:   No family history on file.   Social History:   Reports 1 margarita every few months. Denies tobacco use. Denies drug use. Lives with his wife and performs his own ADLs  Review of Systems: A complete  ROS was negative except as per HPI.   Physical Exam: Blood pressure (!) 151/74, pulse 73, temperature 98.3 F (36.8 C), temperature source Oral, resp. rate 14, SpO2 97 %.  Physical Exam Constitutional:      General: He is not in acute distress.    Appearance: He is obese. He is not toxic-appearing.  HENT:     Head: Normocephalic and atraumatic.  Cardiovascular:     Rate and Rhythm: Normal rate and regular rhythm.     Heart sounds: No murmur. No friction rub. No gallop.   Pulmonary:     Effort: Pulmonary effort is normal. No respiratory distress.     Breath sounds: Normal breath sounds. No wheezing or rales.  Abdominal:     General: There is no distension.     Palpations: Abdomen is soft.     Tenderness: There is no abdominal tenderness. There is no guarding.  Musculoskeletal:     Right lower leg: No  edema.     Left lower leg: No edema.  Skin:    General: Skin is warm and dry.  Neurological:     General: No focal deficit present.     Mental Status: He is alert and oriented to person, place, and time.  Psychiatric:        Mood and Affect: Mood normal.        Behavior: Behavior normal.     EKG: personally reviewed my interpretation is: No EKG ordered.   CXR: personally reviewed my interpretation is: Free air present in the right side of the neck and around the aortic knob.  Free air around pericardium.  No pleural effusions bilaterally.  Assessment & Plan by Problem: Active Problems:   Pneumomediastinum (HCC)   #Pneumomediastinum Complication of bronchoscopy performed yesterday.  Chest x-ray reading confirmed pneumomediastinum in addition to extensive subcutaneous emphysema on the right chest wall and right neck.  It also noted airspace opacities, atelectasis versus pneumonia.   Pulmonology is on board and we appreciate their recommendations.  They have recommended oxygen therapy but to avoid positive pressure.  Recommended to monitor for bradycardia and signs of tamponade.  - Telemetry - Nasal cannula to maintain O2 sats > 100% - Percocet PRN for pain management - CT chest non-contrast ordered and pending.      Dispo: Admit patient to Inpatient with expected length of stay greater than 2 midnights.  Signed:  Dr. Jose Persia Internal Medicine PGY-1  Pager: [phone] 05/17/2019, 5:53 PM

## 2019-05-18 ENCOUNTER — Inpatient Hospital Stay (HOSPITAL_COMMUNITY): Payer: Medicare Other

## 2019-05-18 ENCOUNTER — Telehealth: Payer: Self-pay | Admitting: Internal Medicine

## 2019-05-18 ENCOUNTER — Ambulatory Visit (HOSPITAL_COMMUNITY): Admission: RE | Admit: 2019-05-18 | Payer: Medicare Other | Source: Ambulatory Visit

## 2019-05-18 ENCOUNTER — Encounter (HOSPITAL_COMMUNITY): Admission: RE | Admit: 2019-05-18 | Payer: Medicare Other | Source: Ambulatory Visit

## 2019-05-18 DIAGNOSIS — I319 Disease of pericardium, unspecified: Secondary | ICD-10-CM

## 2019-05-18 DIAGNOSIS — Z79899 Other long term (current) drug therapy: Secondary | ICD-10-CM

## 2019-05-18 DIAGNOSIS — I10 Essential (primary) hypertension: Secondary | ICD-10-CM

## 2019-05-18 LAB — BASIC METABOLIC PANEL
Anion gap: 8 (ref 5–15)
BUN: 20 mg/dL (ref 8–23)
CO2: 27 mmol/L (ref 22–32)
Calcium: 8.3 mg/dL — ABNORMAL LOW (ref 8.9–10.3)
Chloride: 104 mmol/L (ref 98–111)
Creatinine, Ser: 0.94 mg/dL (ref 0.61–1.24)
GFR calc Af Amer: >60 mL/min (ref >60)
GFR calc non Af Amer: >60 mL/min (ref >60)
Glucose, Bld: 102 mg/dL — ABNORMAL HIGH (ref 70–99)
Potassium: 4.1 mmol/L (ref 3.5–5.1)
Sodium: 139 mmol/L (ref 135–145)

## 2019-05-18 LAB — HIV ANTIBODY (ROUTINE TESTING W REFLEX): HIV Screen 4th Generation wRfx: NONREACTIVE

## 2019-05-18 MED ORDER — METOPROLOL SUCCINATE ER 50 MG PO TB24
50.0000 mg | ORAL_TABLET | Freq: Every day | ORAL | Status: DC
  Administered 2019-05-18 – 2019-05-19 (×2): 50 mg via ORAL
  Filled 2019-05-18 (×2): qty 1

## 2019-05-18 NOTE — Progress Notes (Signed)
   Subjective:   Henry [name] feeling well this morning. No further chest pain except for when he coughs. He did have a brief episode of sharp left-sided chest pain last night but it resolved with some pain medications. Denies SOB.   We discussed the plan for continuing to coordinate care with pulmonology and monitoring him. All questions and concerns addressed.  Objective: Vital signs in last 24 hours: Vitals:   05/17/19 1809 05/17/19 1839 05/17/19 1841 05/17/19 2310  BP: (!) 163/78  (!) 174/83 (!) 150/74  Pulse: 66  68 63  Resp: 19  18 18   Temp:   97.9 F (36.6 C) 97.7 F (36.5 C)  TempSrc:   Oral Oral  SpO2: 99%  100% 100%  Weight:  99.5 kg    Height:  5\' 11"  (1.803 m)     Physical Exam Vitals signs and nursing note reviewed.  Constitutional:      General: He is not in acute distress.    Appearance: He is obese. He is not toxic-appearing.  HENT:     Head: Normocephalic and atraumatic.  Neck:     Musculoskeletal: No neck rigidity or muscular tenderness.     Comments: Palpable subcutaneous emphysema over the right neck and superior to the right clavicle.  Cardiovascular:     Rate and Rhythm: Normal rate and regular rhythm.     Heart sounds: No murmur. No gallop.      Comments: No Hamman's crunch.  Pulmonary:     Effort: Pulmonary effort is normal. No respiratory distress.     Breath sounds: Normal breath sounds. No wheezing or rales.  Skin:    General: Skin is warm and dry.  Neurological:     General: No focal deficit present.     Mental Status: He is alert and oriented to person, place, and time.     Assessment/Plan:  Active Problems:   Pneumomediastinum ([place])  #Pneumomediastinum #Pneumopericardium Complication of bronchoscopy performed 08/04.  Chest x-ray reading confirmed pneumomediastinum in addition to extensive subcutaneous emphysema on the right chest wall and right neck.  It also noted airspace opacities, atelectasis versus pneumonia. CT chest w/o contrast  also confirmed chest xray findings.   No overnight events. Patient's vital signs have been stable. PCCM on board and we appreciate their recommendations. They have ordered another chest xray to see if any worsening.   - Continue with NS as needed to maintain oxygen sats at 100% - Telemetry   #Essential Hypertension:  Patient takes Metoprolol 50 QD at home. Initially not restarted on admission due to pneumopericardium. Confirmed with Mr. Georgann Housekeeper with the PCCM team that it would be safe to restart at this time.    - Restart home dose Metoprolol 50mg  QD.     Dispo: Anticipated discharge pending medical improvement.   Dr. Jose Persia Internal Medicine PGY-1  Pager: [phone] 05/18/2019, 6:33 AM

## 2019-05-18 NOTE — Telephone Encounter (Signed)
Attempted to call the pt's wife to make aware of an appt for Henry [name] to see Dr. Julien Nordmann on 8/11 at 215pm w/labs at 145pm. Her voicemail was full. I updated the thoracic navigator.

## 2019-05-18 NOTE — Plan of Care (Signed)

## 2019-05-18 NOTE — Progress Notes (Signed)
Internal Medicine Attending:   I saw and examined the patient. I reviewed Dr Basaraba's note and I agree with the resident's findings and plan as documented in the resident's note.    

## 2019-05-18 NOTE — Progress Notes (Signed)
PCCM: Agreed. Thanks for seeing Garner Nash, DO [facility] 05/18/2019 2:12 PM

## 2019-05-18 NOTE — Progress Notes (Addendum)
NAME:  Henry [name], MRN:  [id number], DOB:  Nov 19, 1950, LOS: 1 ADMISSION DATE:  05/17/2019, CONSULTATION DATE: 05/17/2019 REFERRING MD: Janetta Hora, CHIEF COMPLAINT: Chest pain  Brief History   68 year old gentleman admitted from pulmonary clinic to the emergency room for chest pain chest x-ray in pulmonary clinic revealing pneumomediastinum following bronchoscopy on 05/16/2019.  Past Medical History   Past Medical History:  Diagnosis Date  . Anemia    low iron  . BPH (benign prostatic hypertrophy)   . Dyspnea    "a little bit" with exertion  . Frequency of urination   . GERD (gastroesophageal reflux disease)    occasionally uses tums or 1 tbsp vinager  . History of bladder stone   . History of kidney stones   . Hypertension   . Nocturia   . Pneumonia   . Wears glasses      Significant Hospital Events     Consults:  Pulmonary critical care  Procedures:  05/16/2019: Video bronchoscopy with endobronchial ultrasound-guided transbronchial needle aspirations  Significant Diagnostic Tests:  05/16/2019 pathology: Pending  Noncontrasted CT chest 05/17/2019: Pending  Micro Data:  None  Antimicrobials:  None  Interim history/subjective:  Chest pain and dyspnea much better today.   Objective   Blood pressure (!) 169/70, pulse (!) 58, temperature 97.6 F (36.4 C), temperature source Oral, resp. rate 18, height 5\' 11"  (1.803 m), weight 99.5 kg, SpO2 97 %.        Intake/Output Summary (Last 24 hours) at 05/18/2019 1158 Last data filed at 05/18/2019 1100 Gross per 24 hour  Intake 340 ml  Output -  Net 340 ml   Filed Weights   05/17/19 1839  Weight: 99.5 kg    Examination: General: elderly male in NAD HENT: NCAT, PERRL, no JVD. Crepitus in neck  Lungs: Clear to auscultation bilaterally, no crackles no wheeze Cardiovascular: RRR, no MRG Abdomen: Soft, non-tender, non-distended Extremities: NO deformity. ROM intact. NO edema.  Neuro: alert, oriented, non-focal   Resolved Hospital Problem list     Assessment & Plan:   Secondary pneumomediastinum and pneumopericardium following video bronchoscopy with endobronchial ultrasound and transbronchial needle aspiration biopsies -This is a known documented complication of bronchoscopy. - continue supplemental O2 and target SpO2 100% to help with nitrogen washout - CXR now to ensure not worsening. Clinically is better - Observe inpatient for one additional night with plans for discharge tomorrow 8/7 - Telemetry monitoring - No Incentive Spirometry, no Flutter  Right hilar mass, mediastinal adenopathy, pathology pending - cytology pending.   Remainder per IMTS. Greatly appreciate their assistance.   Best practice:  Diet: Regular  Pain/Anxiety/Delirium protocol (if indicated): NA VAP protocol (if indicated): NA DVT prophylaxis: NA GI prophylaxis: NA Glucose control: NA Mobility: Up in char  Code Status: FULL Family Communication: Discussed with patient Disposition: Medicine floor, Tele   Labs   CBC: Recent Labs  Lab 05/17/19 1605  WBC 13.3*  NEUTROABS 10.1*  HGB 13.1  HCT 40.1  MCV 88.3  PLT 093    Basic Metabolic Panel: Recent Labs  Lab 05/17/19 1605 05/18/19 0607  NA 141 139  K 4.0 4.1  CL 107 104  CO2 24 27  GLUCOSE 111* 102*  BUN 20 20  CREATININE 0.97 0.94  CALCIUM 9.1 8.3*   GFR: Estimated Creatinine Clearance: 91.7 mL/min (by C-G formula based on SCr of 0.94 mg/dL). Recent Labs  Lab 05/17/19 1605  WBC 13.3*    Liver Function Tests: No results for input(s): AST,  ALT, ALKPHOS, BILITOT, PROT, ALBUMIN in the last 168 hours. No results for input(s): LIPASE, AMYLASE in the last 168 hours. No results for input(s): AMMONIA in the last 168 hours.  ABG    Component Value Date/Time   TCO2 26 05/08/2019 1738     Coagulation Profile: Recent Labs  Lab 05/16/19 1100  INR 1.0    Cardiac Enzymes: No results for input(s): CKTOTAL, CKMB, CKMBINDEX, TROPONINI in the  last 168 hours.  HbA1C: No results found for: HGBA1C  CBG: No results for input(s): GLUCAP in the last 168 hours.    Georgann Housekeeper, AGACNP-BC [place] Pager [phone] or [phone]  05/18/2019 12:02 PM    PCCM:  68 yo male, pneumonmediastinum, s/p bronchoscopy. Chest pain improved. CXR improved.   BP (!) 145/79 (BP Location: Left Arm)   Pulse 65   Temp 98.2 F (36.8 C) (Oral)   Resp 17   Ht 5\' 11"  (1.803 m)   Wt 99.5 kg   SpO2 99%   BMI 30.60 kg/m  Gen: resting in bed, NAD  Neck: no crepitus Heart: RRR, s1 s2 Lungs: CTAB   CXR reviewed: improved subq air   A:  Pneumomediastinum  Secondary following bronchoscopy   P: Continue O2 If symptoms stable in AM can discharge Follow to be scheduled with me He will need PET scan first.  We will arrange all follow up needed  Thanks to IMTS for there help. Much appreciated.   [facility] 05/18/2019 6:50 PM

## 2019-05-19 ENCOUNTER — Telehealth: Payer: Self-pay | Admitting: Pulmonary Disease

## 2019-05-19 NOTE — Progress Notes (Deleted)
NAME:  Henry [name], MRN:  [id number], DOB:  [birth year], LOS: 2 ADMISSION DATE:  05/17/2019, CONSULTATION DATE: 05/17/2019 REFERRING MD: Janetta Hora, CHIEF COMPLAINT: Chest pain  Brief History   68 year old gentleman admitted from pulmonary clinic to the emergency room for chest pain chest x-ray in pulmonary clinic revealing pneumomediastinum following bronchoscopy on 05/16/2019.  Past Medical History   Past Medical History:  Diagnosis Date  . Anemia    low iron  . BPH (benign prostatic hypertrophy)   . Dyspnea    "a little bit" with exertion  . Frequency of urination   . GERD (gastroesophageal reflux disease)    occasionally uses tums or 1 tbsp vinager  . History of bladder stone   . History of kidney stones   . Hypertension   . Nocturia   . Pneumonia   . Wears glasses      Significant Hospital Events     Consults:  Pulmonary critical care  Procedures:  05/16/2019: Video bronchoscopy with endobronchial ultrasound-guided transbronchial needle aspirations  Significant Diagnostic Tests:  05/16/2019 pathology: Pending  CXR 8/6> improved pneumomediastinum, reduced subq air in neck   Micro Data:  None  Antimicrobials:  None  Interim history/subjective:  States "feelin' good, ready to make a jail break and get [place] here!"  Objective   Blood pressure (!) 159/77, pulse (!) 57, temperature 98.3 F (36.8 C), temperature source Oral, resp. rate 17, height 5\' 11"  (1.803 m), weight 99.5 kg, SpO2 100 %.        Intake/Output Summary (Last 24 hours) at 05/19/2019 1031 Last data filed at 05/18/2019 1100 Gross per 24 hour  Intake 100 ml  Output -  Net 100 ml   Filed Weights   05/17/19 1839  Weight: 99.5 kg    Examination: General: WDWN elderly male, NAD  HENT: NCAT pink mmm. no crepitus In neck. Patent nares. On 2LNC.  Lungs: CTA bilaterally. Symmetrical chest expansion. No accessory muscle use  Cardiovascular: RRR no rgm  Abdomen: soft ndnt.  Extremities: Symmetrical  bulk and tone, no cyanosis, no clubbing  Neuro: AAOx4, PERRL, following commands   Resolved Hospital Problem list     Assessment & Plan:   Secondary pneumomediastinum and pneumopericardium following video bronchoscopy with endobronchial ultrasound and transbronchial needle aspiration biopsies -This is a known documented complication of bronchoscopy. -CXR 8/6 with interval improvement of pneumomediastinum.  P - Clinically Is improved.  - Cleared from pulmonary perspective for discharge.  - If to remain inpatient, continue 2LNC  - No IS, No flutter  Right hilar mass, mediastinal adenopathy, pathology pending - cytology pending.     Rest per primary (IMTS) PCCM will sign off at this time. Anticipate discharge today (8/7)  Best practice:  Diet: Regular  Pain/Anxiety/Delirium protocol (if indicated): NA VAP protocol (if indicated): NA DVT prophylaxis: NA GI prophylaxis: NA Glucose control: NA Mobility: UTC  Code Status: FULL Family Communication: Discussed with pt  Disposition: Medicine floor, Tele   Labs   CBC: Recent Labs  Lab 05/17/19 1605  WBC 13.3*  NEUTROABS 10.1*  HGB 13.1  HCT 40.1  MCV 88.3  PLT 951    Basic Metabolic Panel: Recent Labs  Lab 05/17/19 1605 05/18/19 0607  NA 141 139  K 4.0 4.1  CL 107 104  CO2 24 27  GLUCOSE 111* 102*  BUN 20 20  CREATININE 0.97 0.94  CALCIUM 9.1 8.3*   GFR: Estimated Creatinine Clearance: 91.7 mL/min (by C-G formula based on SCr of 0.94 mg/dL).  Recent Labs  Lab 05/17/19 1605  WBC 13.3*    Liver Function Tests: No results for input(s): AST, ALT, ALKPHOS, BILITOT, PROT, ALBUMIN in the last 168 hours. No results for input(s): LIPASE, AMYLASE in the last 168 hours. No results for input(s): AMMONIA in the last 168 hours.  ABG    Component Value Date/Time   TCO2 26 05/08/2019 1738     Coagulation Profile: Recent Labs  Lab 05/16/19 1100  INR 1.0    Cardiac Enzymes: No results for input(s): CKTOTAL,  CKMB, CKMBINDEX, TROPONINI in the last 168 hours.  HbA1C: No results found for: HGBA1C  CBG: No results for input(s): GLUCAP in the last 168 hours.    Eliseo Gum MSN, AGACNP-BC [place] [phone] If no answer, [phone] 05/19/2019, 10:32 AM

## 2019-05-19 NOTE — Telephone Encounter (Signed)
PCCM:  I attempted to call the patient to review his path results.  All path with no malignant cells.  One with granulomatous inflammation  Garner Nash, DO [facility] 05/19/2019 5:08 PM

## 2019-05-19 NOTE — Progress Notes (Signed)
NAME:  Henry [name], MRN:  [id number], DOB:  June 09, 1951, LOS: 2 ADMISSION DATE:  05/17/2019, CONSULTATION DATE: 05/17/2019 REFERRING MD: Janetta Hora, CHIEF COMPLAINT: Chest pain  Brief History   68 year old gentleman admitted from pulmonary clinic to the emergency room for chest pain chest x-ray in pulmonary clinic revealing pneumomediastinum following bronchoscopy on 05/16/2019.  Past Medical History   Past Medical History:  Diagnosis Date  . Anemia    low iron  . BPH (benign prostatic hypertrophy)   . Dyspnea    "a little bit" with exertion  . Frequency of urination   . GERD (gastroesophageal reflux disease)    occasionally uses tums or 1 tbsp vinager  . History of bladder stone   . History of kidney stones   . Hypertension   . Nocturia   . Pneumonia   . Wears glasses      Significant Hospital Events     Consults:  Pulmonary critical care  Procedures:  05/16/2019: Video bronchoscopy with endobronchial ultrasound-guided transbronchial needle aspirations  Significant Diagnostic Tests:  05/16/2019 pathology: Pending  CXR 8/6> improved pneumomediastinum, reduced subq air in neck   Micro Data:  None  Antimicrobials:  None  Interim history/subjective:  States "feelin' good, ready to make a jail break and get [place] here!"  Objective   Blood pressure (!) 159/77, pulse (!) 57, temperature 98.3 F (36.8 C), temperature source Oral, resp. rate 17, height 5\' 11"  (1.803 m), weight 99.5 kg, SpO2 100 %.        Intake/Output Summary (Last 24 hours) at 05/19/2019 1425 Last data filed at 05/19/2019 1100 Gross per 24 hour  Intake 240 ml  Output -  Net 240 ml   Filed Weights   05/17/19 1839  Weight: 99.5 kg    Examination: General: WDWN elderly male, NAD  HENT: NCAT pink mmm. no crepitus In neck. Patent nares. On 2LNC.  Lungs: CTA bilaterally. Symmetrical chest expansion. No accessory muscle use  Cardiovascular: RRR no rgm  Abdomen: soft ndnt.  Extremities: Symmetrical  bulk and tone, no cyanosis, no clubbing  Neuro: AAOx4, PERRL, following commands   Resolved Hospital Problem list     Assessment & Plan:   Secondary pneumomediastinum and pneumopericardium following video bronchoscopy with endobronchial ultrasound and transbronchial needle aspiration biopsies -This is a known documented complication of bronchoscopy. -CXR 8/6 with interval improvement of pneumomediastinum.  P - Clinically Is improved.  - Cleared from pulmonary perspective for discharge.  - If to remain inpatient, continue 2LNC  - No IS, No flutter  Right hilar mass, mediastinal adenopathy, pathology pending - cytology pending.     Rest per primary ([facility]) PCCM will sign off at this time. Anticipate discharge today (8/7)  Best practice:  Diet: Regular  Pain/Anxiety/Delirium protocol (if indicated): NA VAP protocol (if indicated): NA DVT prophylaxis: NA GI prophylaxis: NA Glucose control: NA Mobility: UTC  Code Status: FULL Family Communication: Discussed with pt  Disposition: Medicine floor, Tele   Labs   CBC: Recent Labs  Lab 05/17/19 1605  WBC 13.3*  NEUTROABS 10.1*  HGB 13.1  HCT 40.1  MCV 88.3  PLT 093    Basic Metabolic Panel: Recent Labs  Lab 05/17/19 1605 05/18/19 0607  NA 141 139  K 4.0 4.1  CL 107 104  CO2 24 27  GLUCOSE 111* 102*  BUN 20 20  CREATININE 0.97 0.94  CALCIUM 9.1 8.3*   GFR: Estimated Creatinine Clearance: 91.7 mL/min (by C-G formula based on SCr of 0.94 mg/dL).  Recent Labs  Lab 05/17/19 1605  WBC 13.3*    Liver Function Tests: No results for input(s): AST, ALT, ALKPHOS, BILITOT, PROT, ALBUMIN in the last 168 hours. No results for input(s): LIPASE, AMYLASE in the last 168 hours. No results for input(s): AMMONIA in the last 168 hours.  ABG    Component Value Date/Time   TCO2 26 05/08/2019 1738     Coagulation Profile: Recent Labs  Lab 05/16/19 1100  INR 1.0    Cardiac Enzymes: No results for input(s): CKTOTAL,  CKMB, CKMBINDEX, TROPONINI in the last 168 hours.  HbA1C: No results found for: HGBA1C  CBG: No results for input(s): GLUCAP in the last 168 hours.    Eliseo Gum MSN, AGACNP-BC [place] [phone] If no answer, [phone] 05/19/2019, 2:25 PM

## 2019-05-19 NOTE — Discharge Summary (Signed)
   Name: Henry [name] MRN: [id number] DOB: 1951/04/10 68 y.o. PCP: [facility]  Date of Admission: 05/17/2019  3:45 PM Date of Discharge: 05/19/2019 Attending Physician: Lucious Groves, DO  Discharge Diagnosis: 1. Pneumomediastinum   Discharge Medications: Allergies as of 05/19/2019   No Known Allergies     Medication List    TAKE these medications   metoprolol succinate 50 MG 24 hr tablet Commonly known as: TOPROL-XL Take 50 mg by mouth daily.   omeprazole 40 MG capsule Commonly known as: PRILOSEC Take 40 mg by mouth daily before breakfast.   oxyCODONE-acetaminophen 5-325 MG tablet Commonly known as: Percocet Take 1 tablet by mouth every 6 (six) hours as needed.   vitamin C 1000 MG tablet Take 1,000 mg by mouth daily.   VITAMIN D3 PO Take 1 tablet by mouth daily.     Disposition and follow-up:   Henry [name] was discharged from [facility] in Stable condition.  At the hospital follow up visit please address:  1.  Pneumomediastinum. Consider repeat x-ray to ensure resolution of the patient's pneumomediastinum  2.  Labs / imaging needed at time of follow-up: CXR  3.  Pending labs/ test needing follow-up: None  Follow-up Appointments: Follow-up Information    Icard, Bradley L, DO Follow up.   Specialty: Pulmonary Disease Why: Dr. Juline Patch office will contact you about follow up.  Contact information: [place] [address] [phone]        [facility] Follow up.   Why: Follow up as needed Contact information: [place] [id number] [place] Hospital Course by problem list:  Pneumomediastinum. Henry [name] is a 68 year old male currently being worked up for right hilar mass who presented to the emergency department with pleuritic chest pain and shortness of breath approximately 24 hours after an ultrasound guided endobronchial biopsy. Chest  x-ray illustrated a pneumomediastinum. Pulmonology was consulted and recommended oxygen therapy, avoiding positive pressure ventilation, and monitoring the patient's vital signs for signs of tamponade. Over the next 48 hours he remained hemodynamically stable and interval chest x-ray illustrated production in the patient's pneumomediastinum. He was discharged in stable condition with instructions to follow-up with pulmonology.  Discharge Vitals:   BP (!) 159/77 (BP Location: Left Arm)   Pulse (!) 57   Temp 98.3 F (36.8 C) (Oral)   Resp 17   Ht 5\' 11"  (1.803 m)   Wt 99.5 kg   SpO2 100%   BMI 30.60 kg/m   Discharge Instructions: Discharge Instructions    Diet - low sodium heart healthy   Complete by: As directed    Discharge instructions   Complete by: As directed    Thank you for allowing [place] to care for you during your hospital stay.   You were admitted to the hospital due to air presence in a section of your chest called the mediastinum. It has improved significantly since your admission and will continue to do so.   Please return to the ED if you experience any sudden onset chest pain or shortness of breathe.   Increase activity slowly   Complete by: As directed     Signed: Ina Homes, MD 05/19/2019, 10:55 AM

## 2019-05-19 NOTE — Progress Notes (Signed)
   Subjective:   Henry [name] reports he is feeling well this AM. He denies any CP and SHOB. He is anxious to go home. Discussed plan for discharge today. All questions and concerns addressed.   Objective:  Vital signs in last 24 hours: Vitals:   05/17/19 2310 05/18/19 0725 05/18/19 1709 05/18/19 2335  BP: (!) 150/74 (!) 169/70 (!) 145/79 (!) 144/63  Pulse: 63 (!) 58 65 62  Resp: 18 18 17 18   Temp: 97.7 F (36.5 C) 97.6 F (36.4 C) 98.2 F (36.8 C) 98.3 F (36.8 C)  TempSrc: Oral Oral Oral Oral  SpO2: 100% 97% 99% 99%  Weight:      Height:       Physical Exam Vitals signs and nursing note reviewed.  Constitutional:      Appearance: He is obese.  HENT:     Head: Normocephalic and atraumatic.  Cardiovascular:     Rate and Rhythm: Normal rate and regular rhythm.     Heart sounds: No murmur. No gallop.   Pulmonary:     Effort: Pulmonary effort is normal. No respiratory distress.  Skin:    Comments: Minimal subcutaneous emphysema present above the right clavicle. Improved from prior examination  Neurological:     Mental Status: He is alert.     Assessment/Plan:  Principal Problem:   Pneumomediastinum (Melissa) Active Problems:   Essential hypertension   Mediastinal adenopathy  #Pneumomediastinum #Pneumopericardium Complication of bronchoscopy performed 08/04. Repeat chest xray notes significant improvement in pneumomediastinum. PCCM noted that if he is stable today, they are comfortable with discharging patient. No events overnight.    - Discharge today. PCCM will schedule outpatient follow ups.   #Essential Hypertension:  Blood pressure much improved with home meds on board.    Dispo: Anticipated discharge today.   Dr. Jose Persia Internal Medicine PGY-1  Pager: [phone] 05/19/2019, 6:36 AM

## 2019-05-22 ENCOUNTER — Telehealth: Payer: Self-pay | Admitting: Pulmonary Disease

## 2019-05-23 ENCOUNTER — Other Ambulatory Visit: Payer: Medicare Other

## 2019-05-23 ENCOUNTER — Ambulatory Visit: Payer: Medicare Other | Admitting: Internal Medicine

## 2019-05-23 NOTE — Telephone Encounter (Signed)
I have rescheduled pt's PET scan to tomorrow afternoon.  Marye Round is going to call pt and give him appt info because she is going to schedule him to see BI as well.  Nothing further needed.

## 2019-05-23 NOTE — Telephone Encounter (Signed)
PET 05/24/19 @ 2pm. OV 05/26/19 @ 1130 with B Mack. Nothing further needed at this time.

## 2019-05-24 ENCOUNTER — Other Ambulatory Visit: Payer: Self-pay

## 2019-05-24 ENCOUNTER — Ambulatory Visit (HOSPITAL_COMMUNITY)
Admission: RE | Admit: 2019-05-24 | Discharge: 2019-05-24 | Disposition: A | Discharge status: Home / Self Care | Payer: Medicare Other | Source: Ambulatory Visit | Attending: Pulmonary Disease | Admitting: Pulmonary Disease

## 2019-05-24 ENCOUNTER — Ambulatory Visit: Payer: Medicare Other | Admitting: Pulmonary Disease

## 2019-05-24 DIAGNOSIS — N400 Enlarged prostate without lower urinary tract symptoms: Secondary | ICD-10-CM | POA: Diagnosis not present

## 2019-05-24 DIAGNOSIS — R590 Localized enlarged lymph nodes: Secondary | ICD-10-CM | POA: Diagnosis present

## 2019-05-24 DIAGNOSIS — R911 Solitary pulmonary nodule: Secondary | ICD-10-CM | POA: Diagnosis present

## 2019-05-24 DIAGNOSIS — N200 Calculus of kidney: Secondary | ICD-10-CM | POA: Diagnosis not present

## 2019-05-24 DIAGNOSIS — K5730 Diverticulosis of large intestine without perforation or abscess without bleeding: Secondary | ICD-10-CM | POA: Insufficient documentation

## 2019-05-24 DIAGNOSIS — I2510 Atherosclerotic heart disease of native coronary artery without angina pectoris: Secondary | ICD-10-CM | POA: Diagnosis not present

## 2019-05-24 DIAGNOSIS — Z87891 Personal history of nicotine dependence: Secondary | ICD-10-CM | POA: Diagnosis present

## 2019-05-24 DIAGNOSIS — R918 Other nonspecific abnormal finding of lung field: Secondary | ICD-10-CM | POA: Diagnosis present

## 2019-05-24 DIAGNOSIS — I700 Atherosclerosis of aorta: Secondary | ICD-10-CM | POA: Diagnosis not present

## 2019-05-24 DIAGNOSIS — I712 Thoracic aortic aneurysm, without rupture: Secondary | ICD-10-CM | POA: Diagnosis not present

## 2019-05-24 DIAGNOSIS — I517 Cardiomegaly: Secondary | ICD-10-CM | POA: Diagnosis not present

## 2019-05-24 LAB — GLUCOSE, CAPILLARY: Glucose-Capillary: 86 mg/dL (ref 70–99)

## 2019-05-24 MED ORDER — FLUDEOXYGLUCOSE F - 18 (FDG) INJECTION
12.5000 | Freq: Once | INTRAVENOUS | Status: AC
  Administered 2019-05-24: 12.5 via INTRAVENOUS

## 2019-05-25 ENCOUNTER — Other Ambulatory Visit: Payer: Self-pay | Admitting: *Deleted

## 2019-05-25 NOTE — Progress Notes (Signed)
The proposed treatment discussed in cancer conference 05/25/2019 is for discussion purpose only and is not a binding recommendation.  The patient was not physically examined nor present for their treatment options.  Therefore, final treatment plans cannot be decided.  

## 2019-05-26 ENCOUNTER — Encounter: Payer: Self-pay | Admitting: Pulmonary Disease

## 2019-05-26 ENCOUNTER — Other Ambulatory Visit: Payer: Self-pay

## 2019-05-26 ENCOUNTER — Ambulatory Visit (INDEPENDENT_AMBULATORY_CARE_PROVIDER_SITE_OTHER): Payer: Medicare Other | Admitting: Pulmonary Disease

## 2019-05-26 ENCOUNTER — Telehealth: Payer: Self-pay | Admitting: Pulmonary Disease

## 2019-05-26 VITALS — BP 116/68 | HR 72 | Temp 97.9°F | Ht 71.0 in | Wt 211.6 lb

## 2019-05-26 DIAGNOSIS — J982 Interstitial emphysema: Secondary | ICD-10-CM

## 2019-05-26 DIAGNOSIS — R918 Other nonspecific abnormal finding of lung field: Secondary | ICD-10-CM | POA: Diagnosis not present

## 2019-05-26 DIAGNOSIS — R911 Solitary pulmonary nodule: Secondary | ICD-10-CM

## 2019-05-26 DIAGNOSIS — R0609 Other forms of dyspnea: Secondary | ICD-10-CM

## 2019-05-26 DIAGNOSIS — R0600 Dyspnea, unspecified: Secondary | ICD-10-CM

## 2019-05-26 NOTE — Progress Notes (Signed)
@Patient  ID: Henry [name], male    DOB: Aug 08, 1951, 68 y.o.   MRN: [id number]  Chief Complaint  Patient presents with   Follow-up    F/U to discuss PET scan and bronch results. States his breathing has been stable since last visit. Chest pain that he had after his procedure is gone.     Referring provider: Pa, [facility]  HPI:  68 year old male former smoker followed in our office for hilar mass  PMH: GERD, diverticulitis, BPH  smoker/ Smoking History: Former Smoker.  Quit 1984.  20-pack-year smoking history. Maintenance: None Pt of: Dr. Valeta Harms  05/26/2019  - Visit   68 year old male former smoker presenting to our office today for follow-up regarding his right hilar mass.  Patient has had a significant work-up regarding this.  This was initially found 05/08/2019 on a CTA chest in the emergency room when patient presented there with abdominal pain.  Patient then had a bronchoscopy on 05/16/2019.  All biopsy results have come back negative for malignancy.  One biopsy did show granulomatous tissue.  Patient then presented to our office on 05/17/2019 with chest pain chest x-ray revealed pneumomediastinum.  This prompted the patient to be admitted to the hospital for further evaluation.  Patient was discharged from the hospital and then had a follow-up PET scan.  PET scan completed on 05/24/2019.  Those results are listed below:  05/24/2019-PET scan- dominant right lower lobe mass similar in size to prior CT of 05/08/2019 with maximal SUV of 4.5, enlarged right infrahilar lymph node a maximum SUV of 4, both of these are greater than blood pool activity although are not as hypermetabolic as the liver, similar activity and smaller right hilar and subcarinal lymph nodes, biopsies were negative for malignant cells and right hilar mass biopsy included some slight to logical features suggesting granulomatous inflammation, granulomatous response can certainly cause accentuated metabolic activity  and cannot be excluded based off of current PET CT finding, low-grade malignancy could have similar appearance on PET CT, minimal residual pseudo-mediastinum  Patient was also discussed at the tumor board [place]:  05/25/2019- MTOC-plan: Surgical intervention or follow-up CT scan in 2 months  Patient is presenting today with his spouse to further discuss PET scan imaging, as well as work-up for hilar mass.  Overall patient's chest pain has improved.  Patient does still report some dyspnea with exertion with physical activity.  He reports has been having the symptoms for the last year and a half.  Patient has never had pulmonary function testing before.   Tests:   05/25/2019- MTOC-plan: Surgical intervention or follow-up CT scan in 2 months  05/08/2019-CTA chest- no acute PE, 3.4 cm right infrahilar mass suspicious for bronchogenic carcinoma, mild right hilar and mediastinal adenopathy possibly metastatic disease  05/17/2019- chest x-ray-pseudo-mediastinum with extensive subcutaneous emphysema over right chest wall and soft tissue of the back in the setting of recent bronchoscopy, increasing airspace opacities could reflect worsening atelectasis versus developing pneumonia  05/17/2019-CT chest without contrast-moderate pseudo-mediastinum with air extending into the right supraclavicular fossa and imaged portion of the lower neck, decreased size of infrahilar mass now measuring 2.6 x 1.6 cm previously 2.8 x 2.8  05/24/2019-PET scan- dominant right lower lobe mass similar in size to prior CT of 05/08/2019 with maximal SUV of 4.5, enlarged right infrahilar lymph node a maximum SUV of 4, both of these are greater than blood pool activity although are not as hypermetabolic as the liver, similar activity and smaller right hilar  and subcarinal lymph nodes, biopsies were negative for malignant cells and right hilar mass biopsy included some slight to logical features suggesting granulomatous inflammation,  granulomatous response can certainly cause accentuated metabolic activity and cannot be excluded based off of current PET CT finding, low-grade malignancy could have similar appearance on PET CT, minimal residual pseudo-mediastinum  05/16/2019- EBUS Fine-needle aspiration 7 node specimen 1 of 5 collected-no malignant cells identified Fine-needle aspiration EBUS for our specimen 2 of 5 collected-no malignant cells identified  Fine-needle aspiration see EBUS right hilar mass specimen 3 of 5 collected-no malignant cells identified, suggestive of granulomatous inflammation Bronchial lavage right lower lobe specimen 4 of 5 collected-no malignant cells identified Bronchial brushings right lower lobe specimen 5 of 5 collected-no malignant cells identified   FENO:  No results found for: NITRICOXIDE  PFT: No flowsheet data found.  Imaging: Dg Chest 2 View  Result Date: 05/17/2019 CLINICAL DATA:  Chest discomfort, history of hypertension, recent bronchoscopy EXAM: CHEST - 2 VIEW COMPARISON:  Radiograph May 16, 2019, CTA chest May 07, 2021 FINDINGS: Nodular density in the right hilum compatible with a right hilar mass present on prior exams. There is increasing patchy opacities with a basilar and peripheral predominance are out both lungs with scattered areas of air bronchograms. Furthermore there is extensive subcutaneous emphysema over the right chest wall and in the soft tissues of the base of the right neck a pneumomediastinum is present as well with crescentic air outlining portion of the aorta. No pneumothorax. No effusion. Cardiomediastinal contours are otherwise unremarkable. Airways patent. Degenerative changes are present in the and imaged spine and shoulders. IMPRESSION: 1. Pneumomediastinum and extensive subcutaneous emphysema over the right chest wall and in the soft tissues of the right neck in the setting of recent bronchoscopy. 2. Increasing airspace opacities could reflect worsening  atelectasis versus developing pneumonia. These results were called by telephone at the time of interpretation on 05/17/2019 at 2:24 pm to [facility] NP, who verbally acknowledged these results. Electronically Signed   By: Lovena Le M.D.   On: 05/17/2019 14:25   Ct Chest Wo Contrast  Result Date: 05/17/2019 CLINICAL DATA:  Chest pain and shortness of breath status post bronchoscopy EXAM: CT CHEST WITHOUT CONTRAST TECHNIQUE: Multidetector CT imaging of the chest was performed following the standard protocol without IV contrast. COMPARISON:  Chest radiograph 05/17/2019 CTA chest 05/08/2019 FINDINGS: Cardiovascular: There are coronary artery calcifications. Heart size is normal. No aortic atherosclerosis. Mediastinum/Nodes: There is pneumomediastinum outlining the course of the thoracic esophagus, the central airways and great vessels. The air extends into the right-greater-than-left lower neck and the right supraclavicular fossa. No mediastinal adenopathy. Lungs/Pleura: Infrahilar right mass appears to have slightly decreased in size, now measuring 2.6 x 1.6 cm. There is no pleural effusion. No other pulmonary nodules or masses. No focal consolidation. Upper Abdomen: No acute abnormality. Musculoskeletal: No chest wall mass or suspicious bone lesions identified. IMPRESSION: 1. Moderate pneumomediastinum with air extending into the right supraclavicular fossa and imaged portion of the lower neck. 2. Decreased size of infrahilar right mass, now measuring 2.6 x 1.6 cm, previously 2.8 x 2.8 cm. Electronically Signed   By: Ulyses Jarred M.D.   On: 05/17/2019 21:20   Nm Pet Image Initial (pi) Skull Base To Thigh  Result Date: 05/24/2019 CLINICAL DATA:  Initial treatment strategy for right infrahilar nodule. Malignant cells not identified at bronchoscopic sampling. EXAM: NUCLEAR MEDICINE PET SKULL BASE TO THIGH TECHNIQUE: 12.5 mCi F-18 FDG was injected intravenously. Full-ring  PET imaging was performed from the  skull base to thigh after the radiotracer. CT data was obtained and used for attenuation correction and anatomic localization. Fasting blood glucose: 86 mg/dl COMPARISON:  Chest CT from 05/17/2019 FINDINGS: Mediastinal blood pool activity: SUV max 2.9 Liver activity: SUV max 4.4 NECK: No significant abnormal hypermetabolic activity in this region. Incidental CT findings: none CHEST: 3.3 by 2.7 cm right infrahilar mass on image 83/4, maximum SUV 4.5. A right infrahilar lymph node measuring about 1.5 cm in short axis (previously 1.7 cm) has a maximum SUV of 4.0. Small right hilar node maximum SUV 4.0. 0.9 cm subcarinal node, maximum SUV 4.5. Small AP window lymph nodes with maximum SUV up to 3.8 Incidental CT findings: Minimal residual pneumomediastinum. Moderate cardiomegaly. Left anterior descending coronary artery atherosclerosis. 4.1 cm ascending thoracic aortic aneurysm, stable. ABDOMEN/PELVIS: No significant abnormal hypermetabolic activity in this region. Incidental CT findings: 2 mm right kidney upper pole nonobstructive renal calculus. Aortoiliac atherosclerotic vascular disease. Chronic borderline prominence of the appendix, as on 08/12/2011. Sigmoid colon diverticulosis. Considerable prostatomegaly. SKELETON: No significant abnormal hypermetabolic activity in this region. Incidental CT findings: none IMPRESSION: 1. The dominant right lower lobe mass similar in size to the prior CT of 05/08/2019 with maximum SUV of 4.5. Enlarged right infrahilar lymph node with maximum SUV of 4.0. Both of these of greater than blood pool activity although are not as hypermetabolic as the liver. Similar activity in smaller right hilar and subcarinal lymph nodes. Biopsies were negative for malignant cells and right hilar mass biopsy included some cytologic features suggestive of granulomatous inflammation. Granulomatous response can certainly cause accentuated metabolic activity and cannot be excluded based on current PET-CT  finding; low-grade malignancy could have a similar appearance on PET-CT. 2. Minimal residual pneumomediastinum. 3. Other imaging findings of potential clinical significance: Aortic Atherosclerosis (ICD10-I70.0). Coronary atherosclerosis. Moderate cardiomegaly. Stable appearance of 4.1 cm ascending thoracic aortic aneurysm. Aortic aneurysm NOS (ICD10-I71.9). Nonobstructive right nephrolithiasis. Sigmoid colon diverticulosis. Prostatomegaly. Electronically Signed   By: Van Clines M.D.   On: 05/24/2019 15:24   Dg Chest Port 1 View  Result Date: 05/18/2019 CLINICAL DATA:  History of pneumomediastinum EXAM: PORTABLE CHEST 1 VIEW COMPARISON:  May 17, 2019 CT and radiograph FINDINGS: There is been interval significant reduction in the pneumomediastinum and subcutaneous emphysema seen overlying the right neck. No pneumothorax is seen. Again noted is a right infrahilar mass. No large airspace consolidation. The cardiomediastinal silhouette is stable. IMPRESSION: 1. Interval significant reduction in the pneumomediastinum and subcutaneous emphysema overlying the right neck. 2. right infrahilar rounded mass. Electronically Signed   By: Prudencio Pair M.D.   On: 05/18/2019 14:14   Dg Chest Port 1 View  Result Date: 05/16/2019 CLINICAL DATA:  Bronchoscopy. EXAM: PORTABLE CHEST 1 VIEW COMPARISON:  CT 05/08/2019.  Chest x-ray 08/31/2014. FINDINGS: Mediastinum and hilar structures are unchanged, again adenopathy may be present. Stable cardiomegaly. Right infrahilar mass best identified by prior CT. No pleural effusion. No evidence of pneumothorax post bronchoscopy. IMPRESSION: No evidence of pneumothorax post bronchoscopy. Right infrahilar mass best identified by prior CT. Electronically Signed   By: Marcello Moores  Register   On: 05/16/2019 15:32   Ct Angio Chest/abd/pel For Dissection W And/or Wo Contrast  Result Date: 05/08/2019 CLINICAL DATA:  Back pain, shortness of breath EXAM: CT ANGIOGRAPHY CHEST, ABDOMEN AND  PELVIS TECHNIQUE: Multidetector CT imaging through the chest, abdomen and pelvis was performed using the standard protocol during bolus administration of intravenous contrast. Multiplanar reconstructed images and  MIPs were obtained and reviewed to evaluate the vascular anatomy. CONTRAST:  173mL OMNIPAQUE IOHEXOL 350 MG/ML SOLN COMPARISON:  08/12/2011 and previous FINDINGS: CTA CHEST FINDINGS Cardiovascular: Heart size upper limits normal. RV is nondilated. Satisfactory opacification of pulmonary arteries noted, and there is no evidence of pulmonary emboli. Scattered coronary calcifications. Good contrast opacification of the thoracic aorta. 4.1 cm aneurysmal dilatation of the ascending thoracic aorta. Classic 3 vessel brachiocephalic arterial origin anatomy without proximal stenosis. No dissection or stenosis. Minimal scattered plaque in the descending segment. Mediastinum/Nodes: 1.2 cm precarinal lymph node. 1.5 cm right hilar lymph node. Lungs/Pleura: No pleural effusion. No pneumothorax. 3.4 x 2.1 cm right infrahilar mass abutting the hilum, new since previous. Small subpleural blebs in the lung apices. Musculoskeletal: Mild spondylitic changes in the lower thoracic spine. No fracture or worrisome bone lesion. Review of the MIP images confirms the above findings. CTA ABDOMEN AND PELVIS FINDINGS VASCULAR Aorta: Mild calcified atheromatous plaque in the infrarenal segment. No aneurysm, dissection, or stenosis. Celiac: Patent without evidence of aneurysm, dissection, vasculitis or significant stenosis. SMA: Patent without evidence of aneurysm, dissection, vasculitis or significant stenosis. Renals: Duplicated left, superior dominant, both patent. Duplicated right, superior dominant, both patent. IMA: Patent without evidence of aneurysm, dissection, vasculitis or significant stenosis. Inflow: Mildly tortuous iliac arterial system without aneurysm or stenosis. Veins: No obvious venous abnormality within the  limitations of this arterial phase study. Review of the MIP images confirms the above findings. NON-VASCULAR Hepatobiliary: No focal liver abnormality is seen. No gallstones, gallbladder wall thickening, or biliary dilatation. Pancreas: Unremarkable. No pancreatic ductal dilatation or surrounding inflammatory changes. Spleen: Normal in size without focal abnormality. Adrenals/Urinary Tract: Normal adrenals. No renal mass or hydronephrosis. Urinary bladder incompletely distended. Stomach/Bowel: Stomach is nondistended. Small bowel decompressed. Normal appendix. The colon is nondilated with scattered distal descending and sigmoid diverticula; no adjacent inflammatory/edematous change or abscess. Lymphatic: No abdominal or pelvic adenopathy. Reproductive: Prostate enlargement with central coarse calcifications. Other: No ascites. No free air. Musculoskeletal: Spondylitic changes L4-5. No fracture or worrisome bone lesion. Review of the MIP images confirms the above findings. IMPRESSION: 1. No acute PE or thoracic aortic dissection. 2. 3.4 cm right infrahilar mass, suspicious for primary bronchogenic carcinoma. Follow-up recommended. 3. Mild right hilar and mediastinal adenopathy, possibly metastatic disease. 4. 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 Recommend annual imaging followup by CTA or MRA. This recommendation follows 2010 ACCF/AHA/AATS/ACR/ASA/SCA/SCAI/SIR/STS/SVM Guidelines for the Diagnosis and Management of Patients with Thoracic Aortic Disease. Circulation. 2010; 121: [id number] 5. Coronary and aortic Atherosclerosis (ICD10-I70.0). 6. Descending and sigmoid diverticulosis. 7. Prostatomegaly. 8. Lumbar degenerative changes as above. Electronically Signed   By: Lucrezia Europe M.D.   On: 05/08/2019 20:45      Specialty Problems      Pulmonary Problems   HIATAL HERNIA    Qualifier: Diagnosis of  By: Nils Pyle CMA (AAMA), Mearl Latin        Dyspnea    Followed in Pulmonary clinic/ [facility]/  [place] - 08/31/2014  Walked RA x 3 laps @ 185 ft each stopped due to  End of study, not sob, no desat - trial off acei 08/31/2014 >>      Hilar mass    05/08/2019-CTA chest- no acute PE, 3.4 cm right infrahilar mass suspicious for bronchogenic carcinoma, mild right hilar and mediastinal adenopathy possibly metastatic disease  05/16/2019- EBUS Fine-needle aspiration 7 node specimen 1 of 5 collected-no malignant cells identified Fine-needle aspiration EBUS for our specimen 2 of 5 collected-no malignant cells  identified  Fine-needle aspiration see EBUS right hilar mass specimen 3 of 5 collected-no malignant cells identified, suggestive of granulomatous inflammation Bronchial lavage right lower lobe specimen 4 of 5 collected-no malignant cells identified Bronchial brushings right lower lobe specimen 5 of 5 collected-no malignant cells identified  05/17/2019-CT chest without contrast-moderate pseudo-mediastinum with air extending into the right supraclavicular fossa and imaged portion of the lower neck, decreased size of infrahilar mass now measuring 2.6 x 1.6 cm previously 2.8 x 2.8  05/24/2019-PET scan- dominant right lower lobe mass similar in size to prior CT of 05/08/2019 with maximal SUV of 4.5, enlarged right infrahilar lymph node a maximum SUV of 4, both of these are greater than blood pool activity although are not as hypermetabolic as the liver, similar activity and smaller right hilar and subcarinal lymph nodes, biopsies were negative for malignant cells and right hilar mass biopsy included some slight to logical features suggesting granulomatous inflammation, granulomatous response can certainly cause accentuated metabolic activity and cannot be excluded based off of current PET CT finding, low-grade malignancy could have similar appearance on PET CT, minimal residual pseudo-mediastinum  05/25/2019- MTOC-plan: Surgical intervention or follow-up CT scan in 2 months          No Known  Allergies  Immunization History  Administered Date(s) Administered   Tdap 08/01/2014    Past Medical History:  Diagnosis Date   Anemia    low iron   BPH (benign prostatic hypertrophy)    Dyspnea    "a little bit" with exertion   Frequency of urination    GERD (gastroesophageal reflux disease)    occasionally uses tums or 1 tbsp vinager   History of bladder stone    History of kidney stones    Hypertension    Nocturia    Pneumonia    Wears glasses     Tobacco History: Social History   Tobacco Use  Smoking Status Former Smoker   Packs/day: 1.00   Years: 20.00   Pack years: 20.00   Types: Cigarettes   Quit date: 09/29/1983   Years since quitting: 35.6  Smokeless Tobacco Never Used   Counseling given: Yes   Continue to not smoke  Outpatient Encounter Medications as of 05/26/2019  Medication Sig   Ascorbic Acid (VITAMIN C) 1000 MG tablet Take 1,000 mg by mouth daily.   Cholecalciferol (VITAMIN D3 PO) Take 1 tablet by mouth daily.   metoprolol succinate (TOPROL-XL) 50 MG 24 hr tablet Take 50 mg by mouth daily.   omeprazole (PRILOSEC) 40 MG capsule Take 40 mg by mouth daily before breakfast.    oxyCODONE-acetaminophen (PERCOCET) 5-325 MG tablet Take 1 tablet by mouth every 6 (six) hours as needed. (Patient not taking: Reported on 05/26/2019)   No facility-administered encounter medications on file as of 05/26/2019.      Review of Systems  Review of Systems  Constitutional: Negative for activity change, chills, fatigue, fever and unexpected weight change.  HENT: Negative for postnasal drip, rhinorrhea, sinus pressure, sinus pain and sore throat.   Eyes: Negative.   Respiratory: Positive for shortness of breath. Negative for cough and wheezing.   Cardiovascular: Negative for chest pain and palpitations.  Gastrointestinal: Negative for constipation, diarrhea, nausea and vomiting.  Endocrine: Negative.   Genitourinary: Negative.    Musculoskeletal: Negative.   Skin: Negative.   Neurological: Negative for dizziness and headaches.  Psychiatric/Behavioral: Negative.  Negative for dysphoric mood. The patient is not nervous/anxious.   All other systems reviewed and are negative.  Physical Exam  BP 116/68 (BP Location: Left Arm, Patient Position: Sitting, Cuff Size: Normal)    Pulse 72    Temp 97.9 F (36.6 C) (Oral)    Ht 5\' 11"  (1.803 m)    Wt 211 lb 9.6 oz (96 kg)    SpO2 98%    BMI 29.51 kg/m   Wt Readings from Last 5 Encounters:  05/26/19 211 lb 9.6 oz (96 kg)  05/17/19 219 lb 6.4 oz (99.5 kg)  05/17/19 212 lb (96.2 kg)  05/16/19 213 lb (96.6 kg)  08/31/14 210 lb (95.3 kg)     Physical Exam Vitals signs and nursing note reviewed.  Constitutional:      General: He is not in acute distress.    Appearance: Normal appearance.  HENT:     Head: Normocephalic and atraumatic.     Right Ear: Hearing and external ear normal.     Left Ear: Hearing and external ear normal.  Eyes:     Pupils: Pupils are equal, round, and reactive to light.  Neck:     Musculoskeletal: Normal range of motion.  Pulmonary:     Effort: Pulmonary effort is normal.     Breath sounds: No decreased breath sounds.  Skin:    General: Skin is warm and dry.     Capillary Refill: Capillary refill takes less than 2 seconds.     Findings: No rash.  Neurological:     General: No focal deficit present.     Mental Status: He is alert and oriented to person, place, and time.     Coordination: Coordination normal.     Gait: Gait is intact. Gait normal.  Psychiatric:        Mood and Affect: Mood normal. Affect is tearful.        Behavior: Behavior normal. Behavior is cooperative.        Thought Content: Thought content normal.        Judgment: Judgment normal.     In-depth physical exam was deferred today as majority of the office visit was spent reviewing CT scans, blood work, bronchoscopy, PET scan, tumor board discussion.   Lab  Results:  CBC    Component Value Date/Time   WBC 13.3 (H) 05/17/2019 1605   RBC 4.54 05/17/2019 1605   HGB 13.1 05/17/2019 1605   HCT 40.1 05/17/2019 1605   PLT 181 05/17/2019 1605   MCV 88.3 05/17/2019 1605   MCH 28.9 05/17/2019 1605   MCHC 32.7 05/17/2019 1605   RDW 14.5 05/17/2019 1605   LYMPHSABS 1.8 05/17/2019 1605   MONOABS 1.1 (H) 05/17/2019 1605   EOSABS 0.2 05/17/2019 1605   BASOSABS 0.1 05/17/2019 1605    BMET    Component Value Date/Time   NA 139 05/18/2019 0607   K 4.1 05/18/2019 0607   CL 104 05/18/2019 0607   CO2 27 05/18/2019 0607   GLUCOSE 102 (H) 05/18/2019 0607   BUN 20 05/18/2019 0607   CREATININE 0.94 05/18/2019 0607   CALCIUM 8.3 (L) 05/18/2019 0607   GFRNONAA >60 05/18/2019 0607   GFRAA >60 05/18/2019 0607    BNP No results found for: BNP  ProBNP No results found for: PROBNP    Assessment & Plan:   Pneumomediastinum ([place]) Currently improved on PET scan  Plan: Will monitor clinically  Hilar mass Plan: We will order pulmonary function testing to be completed within the next 4 weeks We will order follow-up CT scan in 6 weeks We will refer to cardiothoracic  surgery  Abnormal findings on diagnostic imaging of lung Plan: We will repeat CT imaging in 6 weeks We will refer to cardiothoracic surgery for initial surgical consult  Dyspnea Plan: We will order pulmonary function testing in case patient needs to proceed forward with surgery /based off smoking history     Return in about 4 weeks (around 06/23/2019), or if symptoms worsen or fail to improve, for Follow up for PFT, Follow up with [name] FNP-C, Follow up with Dr. Valeta Harms.   Lauraine Rinne, NP 05/26/2019   This appointment was 28 minutes long with over 50% of the time in direct face-to-face patient care, assessment, plan of care, and follow-up.

## 2019-05-26 NOTE — Assessment & Plan Note (Signed)
Plan: We will order pulmonary function testing in case patient needs to proceed forward with surgery /based off smoking history

## 2019-05-26 NOTE — Telephone Encounter (Signed)
Patient was seen today by Aaron Edelman. He would like for the patient to have a full PFT within the next 4 weeks with an OV afterwards with either Aaron Edelman or Dr. Valeta Harms.   Thanks!

## 2019-05-26 NOTE — Assessment & Plan Note (Signed)
Currently improved on PET scan  Plan: Will monitor clinically

## 2019-05-26 NOTE — Assessment & Plan Note (Signed)
Plan: We will order pulmonary function testing to be completed within the next 4 weeks We will order follow-up CT scan in 6 weeks We will refer to cardiothoracic surgery

## 2019-05-26 NOTE — Assessment & Plan Note (Signed)
Plan: We will repeat CT imaging in 6 weeks We will refer to cardiothoracic surgery for initial surgical consult

## 2019-05-26 NOTE — Progress Notes (Signed)
Discussed with patient office today.  Will follow with a CT scan in 6 weeks.  We will also refer to surgical team to initially establish with them.  Will order pulmonary function testing to prepare for potential surgery as well as to evaluate smoking history and continued dyspnea.[name], FNP

## 2019-05-26 NOTE — Patient Instructions (Signed)
You were seen today by Lauraine Rinne, NP  for:   1. Hilar mass  - Pulmonary function test; Future - Ambulatory referral to Cardiothoracic Surgery - CT Chest Wo Contrast; Future  2. Dyspnea on exertion  - Pulmonary function test; Future  3. Abnormal findings on diagnostic imaging of lung  - Pulmonary function test; Future - Ambulatory referral to Cardiothoracic Surgery - CT Chest Wo Contrast; Future  4. Pneumomediastinum ([place])  Improving - continue to monitor    We recommend today:  Orders Placed This Encounter  Procedures  . CT Chest Wo Contrast    Complete around 07/06/2019    Standing Status:   Future    Standing Expiration Date:   07/25/2020    Order Specific Question:   Preferred imaging location?    Answer:   [facility]    Order Specific Question:   Radiology Contrast Protocol - do NOT remove file path    Answer:   [email or web address]  . Ambulatory referral to Cardiothoracic Surgery    Referral Priority:   Routine    Referral Type:   Surgical    Referral Reason:   Specialty Services Required    Requested Specialty:   Cardiothoracic Surgery    Number of Visits Requested:   1  . Pulmonary function test    Standing Status:   Future    Standing Expiration Date:   05/25/2020    Order Specific Question:   Where should this test be performed?    Answer:   [facility]    Order Specific Question:   Full PFT: includes the following: basic spirometry, spirometry pre & post bronchodilator, diffusion capacity (DLCO), lung volumes    Answer:   Full PFT   Orders Placed This Encounter  Procedures  . CT Chest Wo Contrast  . Ambulatory referral to Cardiothoracic Surgery  . Pulmonary function test   No orders of the defined types were placed in this encounter.   Follow Up:    Return in about 4 weeks (around 06/23/2019), or if symptoms worsen or fail to improve, for Follow up for PFT, Follow up with [name] FNP-C, Follow up with  Dr. Valeta Harms.   Please do your part to reduce the spread of COVID-19:      Reduce your risk of any infection  and COVID19 by using the similar precautions used for avoiding the common cold or flu:  Marland Kitchen Wash your hands often with soap and warm water for at least 20 seconds.  If soap and water are not readily available, use an alcohol-based hand sanitizer with at least 60% alcohol.  . If coughing or sneezing, cover your mouth and nose by coughing or sneezing into the elbow areas of your shirt or coat, into a tissue or into your sleeve (not your hands). Langley Gauss A MASK when in public  . Avoid shaking hands with others and consider head nods or verbal greetings only. . Avoid touching your eyes, nose, or mouth with unwashed hands.  . Avoid close contact with people who are sick. . Avoid places or events with large numbers of people in one location, like concerts or sporting events. . If you have some symptoms but not all symptoms, continue to monitor at home and seek medical attention if your symptoms worsen. . If you are having a medical emergency, call 911.   ADDITIONAL HEALTHCARE OPTIONS FOR PATIENTS  [facility] / e-Visit: [email or web address]  [facility]: [facility]: [phone]                   [facility]: [phone]     It is flu season:   >>> Best ways to protect herself from the flu: Receive the yearly flu vaccine, practice good hand hygiene washing with soap and also using hand sanitizer when available, eat a nutritious meals, get adequate rest, hydrate appropriately   Please contact the office if your symptoms worsen or you have concerns that you are not improving.   Thank you for choosing [facility] for your healthcare, and for allowing [place] to partner with you on your healthcare journey. I am thankful to be able to provide care to you today.    [name] FNP-C

## 2019-05-27 NOTE — Progress Notes (Signed)
PCCM: Agreed. I was present and discussed case at [facility] 6 weeks follow up, referral to TCTS to establish care Garner Nash, DO [facility] 05/27/2019 2:24 PM

## 2019-05-31 ENCOUNTER — Telehealth: Payer: Self-pay | Admitting: Pulmonary Disease

## 2019-05-31 NOTE — Telephone Encounter (Signed)
05/31/2019 0 822  Patient has been scheduled for cardiothoracic surgery appointment on 06/15/2019 at 1615.  This is a consult with Dr. Roxan Hockey for potential surgical intervention for hilar mass.  Patient needs to be scheduled for a pulmonary function test within the next week or 2.  Needs to be completed prior to 06/16/2019 surgical consult office visit.  Routing to [employer] as FYI.   [name] FNP

## 2019-05-31 NOTE — Telephone Encounter (Signed)
-----   Message from Ned Clines sent at 05/30/2019  8:50 AM EDT ----- Regarding: RE: Referral Can you have the PFT's moved up, [facility] can probably do them sooner if necessary.  We can schedule for 9/3 @ 4:15 with Dr Roxan Hockey if you can get the PFT's done before this.  Thanks  Jenny Reichmann ----- Message ----- From: Harland German Sent: 05/26/2019   1:47 PM EDT To: Ned Clines Subject: Referral                                        Referral placed by [name] NP please advise. Thanks Chantel

## 2019-06-01 ENCOUNTER — Other Ambulatory Visit: Payer: Self-pay | Admitting: Pulmonary Disease

## 2019-06-01 NOTE — Telephone Encounter (Signed)
Patient scheduled for PFT on 06/12/2019-pr

## 2019-06-01 NOTE — Telephone Encounter (Signed)
Called and left message on pt vm to call back to schedule PFT -pr  °

## 2019-06-07 NOTE — Telephone Encounter (Signed)
Noted. This has been scheduled. Nothing further needed at this time.

## 2019-06-08 ENCOUNTER — Other Ambulatory Visit (HOSPITAL_COMMUNITY)
Admission: RE | Admit: 2019-06-08 | Discharge: 2019-06-08 | Disposition: A | Discharge status: Home / Self Care | Payer: Medicare Other | Source: Ambulatory Visit | Attending: Pulmonary Disease | Admitting: Pulmonary Disease

## 2019-06-08 DIAGNOSIS — Z01812 Encounter for preprocedural laboratory examination: Secondary | ICD-10-CM | POA: Diagnosis present

## 2019-06-08 DIAGNOSIS — Z20828 Contact with and (suspected) exposure to other viral communicable diseases: Secondary | ICD-10-CM | POA: Insufficient documentation

## 2019-06-08 LAB — SARS CORONAVIRUS 2 (TAT 6-24 HRS): SARS Coronavirus 2: NEGATIVE

## 2019-06-12 ENCOUNTER — Other Ambulatory Visit: Payer: Self-pay

## 2019-06-12 ENCOUNTER — Ambulatory Visit (INDEPENDENT_AMBULATORY_CARE_PROVIDER_SITE_OTHER): Payer: Medicare Other | Admitting: Pulmonary Disease

## 2019-06-12 DIAGNOSIS — R0600 Dyspnea, unspecified: Secondary | ICD-10-CM

## 2019-06-12 DIAGNOSIS — R918 Other nonspecific abnormal finding of lung field: Secondary | ICD-10-CM | POA: Diagnosis not present

## 2019-06-12 DIAGNOSIS — R0609 Other forms of dyspnea: Secondary | ICD-10-CM

## 2019-06-12 LAB — PULMONARY FUNCTION TEST
DL/VA % pred: 129 %
DL/VA: 5.31 ml/min/mmHg/L
DLCO unc % pred: 108 %
DLCO unc: 28.57 ml/min/mmHg
FEF 25-75 Post: 2.96 L/sec
FEF 25-75 Pre: 2.12 L/sec
FEF2575-%Change-Post: 39 %
FEF2575-%Pred-Post: 112 %
FEF2575-%Pred-Pre: 80 %
FEV1-%Change-Post: 8 %
FEV1-%Pred-Post: 83 %
FEV1-%Pred-Pre: 76 %
FEV1-Post: 2.81 L
FEV1-Pre: 2.59 L
FEV1FVC-%Change-Post: 5 %
FEV1FVC-%Pred-Pre: 102 %
FEV6-%Change-Post: 2 %
FEV6-%Pred-Post: 81 %
FEV6-%Pred-Pre: 79 %
FEV6-Post: 3.49 L
FEV6-Pre: 3.4 L
FEV6FVC-%Pred-Post: 105 %
FEV6FVC-%Pred-Pre: 105 %
FVC-%Change-Post: 2 %
FVC-%Pred-Post: 77 %
FVC-%Pred-Pre: 75 %
FVC-Post: 3.51 L
FVC-Pre: 3.41 L
Post FEV1/FVC ratio: 80 %
Post FEV6/FVC ratio: 100 %
Pre FEV1/FVC ratio: 76 %
Pre FEV6/FVC Ratio: 100 %
RV % pred: 95 %
RV: 2.28 L
TLC % pred: 79 %
TLC: 5.59 L

## 2019-06-12 NOTE — Progress Notes (Signed)
Full PFT performed today. °

## 2019-06-12 NOTE — Progress Notes (Signed)
Called and discussed results with pts spouse today. Nothing further needed. Routing to Dr. Roxan Hockey and Dr. Valeta Harms as Juluis Rainier.   Pt with upcoming CVTS consult appt.   [name] FNP

## 2019-06-15 ENCOUNTER — Encounter: Payer: Medicare Other | Admitting: Thoracic Surgery (Cardiothoracic Vascular Surgery)

## 2019-06-27 ENCOUNTER — Other Ambulatory Visit: Payer: Self-pay

## 2019-06-27 ENCOUNTER — Encounter: Payer: Self-pay | Admitting: Thoracic Surgery (Cardiothoracic Vascular Surgery)

## 2019-06-27 ENCOUNTER — Institutional Professional Consult (permissible substitution) (INDEPENDENT_AMBULATORY_CARE_PROVIDER_SITE_OTHER): Payer: Medicare Other | Admitting: Thoracic Surgery (Cardiothoracic Vascular Surgery)

## 2019-06-27 ENCOUNTER — Other Ambulatory Visit: Payer: Self-pay | Admitting: Thoracic Surgery (Cardiothoracic Vascular Surgery)

## 2019-06-27 VITALS — BP 148/70 | HR 72 | Temp 97.5°F | Resp 16 | Ht 70.0 in | Wt 210.0 lb

## 2019-06-27 DIAGNOSIS — R918 Other nonspecific abnormal finding of lung field: Secondary | ICD-10-CM

## 2019-06-27 DIAGNOSIS — R591 Generalized enlarged lymph nodes: Secondary | ICD-10-CM | POA: Diagnosis not present

## 2019-06-27 DIAGNOSIS — I712 Thoracic aortic aneurysm, without rupture, unspecified: Secondary | ICD-10-CM

## 2019-06-27 DIAGNOSIS — R599 Enlarged lymph nodes, unspecified: Secondary | ICD-10-CM

## 2019-06-27 NOTE — Progress Notes (Signed)
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 Lung Mass    HILAR...BRONCH/EBUS 05/16/19, CT CHEST 8/5, PET 8/12, PFT 06/12/19  . Adenopathy  . Lung Mass    RLLobe    HPI: Henry [name] is sent for consultation regarding a right hilar mass  Comments Henry [name] is a 68 year old former smoker with a past medical history significant for hypertension, reflux, anemia, BPH, and remote tobacco use.  He smoked when he was younger but quit around 30 years ago in his early 33s.  This past summer he developed significant back pain with radiation into the abdomen.  He had a CT angiogram of the chest abdomen and pelvis to rule out an aneurysm.  There was a 3.4 x 2.1 cm right hilar mass present.  There was a 4.1 cm ascending aorta but no evidence of dissection.  He was referred to Dr. Valeta Harms.  He underwent bronchoscopy and endobronchial ultrasound.  There was no tumor seen, there were findings suggestive of granulomatous inflammation on one specimen but it was not definitive.  He developed a pneumomediastinum postoperatively and on a repeat CT the right hilar mass was noted to be smaller at 2.6 x 1.6 cm.  A PET CT on 05/24/2019 showed the mass had a maximum SUV of 4.5.  There was a right infrahilar lymph node that also had an SUV of 4.  He denies any fevers, chills, or sweats.  He gets chest pain when he first exerts himself but that is resolved with 5 to 10 minutes of rest.  If he continues to work through the pain it will eventually go away and he is able to complete his 2 mile bike ride.  He does complain of having decreased energy.  He has not had any unusual travel or exposure. Past Medical History:  Diagnosis Date  . Anemia    low iron  . BPH (benign prostatic hypertrophy)   . Dyspnea    "a little bit" with exertion  . Frequency of urination   . GERD (gastroesophageal reflux disease)    occasionally uses tums or 1 tbsp vinager  . History of  bladder stone   . History of kidney stones   . Hypertension   . Nocturia   . Pneumonia   . Wears glasses     Past Surgical History:  Procedure Laterality Date  . CYSTO/  LEFT URETEROSCOPIC STONE EXTRACTION/  BLADDER  STONE EXTRACTION  02-15-2009  . CYSTOSCOPY WITH LITHOLAPAXY N/A 09/29/2013   Procedure: CYSTOSCOPY WITH LITHOLAPAXY;  Surgeon: Franchot Gallo, MD;  Location: [facility];  Service: Urology;  Laterality: N/A;  . LUMBAR [place] SURGERY  09-19-2000   LEFT  L4 -- L5  . REPAIR RIGHT HAND SOFT TISSUE STRUCTURES  02-24-2000   WORK INJURY /  PARTIAL AMPUTATION RIGHT INDEX FINGER  . TRANSURETHRAL RESECTION OF PROSTATE N/A 09/29/2013   Procedure: TRANSURETHRAL RESECTION OF THE PROSTATE WITH GYRUS INSTRUMENTS;  Surgeon: Franchot Gallo, MD;  Location: [facility];  Service: Urology;  Laterality: N/A;  . VIDEO BRONCHOSCOPY WITH ENDOBRONCHIAL ULTRASOUND N/A 05/16/2019   Procedure: VIDEO BRONCHOSCOPY WITH ENDOBRONCHIAL ULTRASOUND;  Surgeon: Garner Nash, DO;  Location: [facility];  Service: Thoracic;  Laterality: N/A;    History reviewed. No pertinent family history.  Social History Social History   Tobacco Use  . Smoking status: Former Smoker    Packs/day: 1.00    Years: 20.00  Pack years: 20.00    Types: Cigarettes    Quit date: 09/29/1983    Years since quitting: 35.7  . Smokeless tobacco: Never Used  Substance Use Topics  . Alcohol use: Yes    Comment: rare  . Drug use: No    Current Outpatient Medications  Medication Sig Dispense Refill  . Ascorbic Acid (VITAMIN C) 1000 MG tablet Take 1,000 mg by mouth daily.    . Cholecalciferol (VITAMIN D3 PO) Take 1 tablet by mouth daily.    . metoprolol succinate (TOPROL-XL) 50 MG 24 hr tablet Take 50 mg by mouth daily.    Marland Kitchen omeprazole (PRILOSEC) 40 MG capsule Take 40 mg by mouth daily before breakfast.   1   No current facility-administered medications for this visit.     No Known  Allergies  Review of Systems  Constitutional: Positive for fatigue. Negative for activity change, appetite change and unexpected weight change.  HENT: Negative for trouble swallowing (No trouble currently, esophageal dilatation x2) and voice change.   Respiratory: Positive for shortness of breath. Negative for cough and wheezing.   Cardiovascular: Positive for chest pain.  Genitourinary:       Kidney and bladder stones  Musculoskeletal: Positive for back pain.  Neurological: Negative for seizures, syncope and weakness.  Hematological: Negative for adenopathy. Does not bruise/bleed easily.    BP (!) 148/70 (BP Location: Right Arm, Patient Position: Sitting, Cuff Size: Normal)   Pulse 72   Temp (!) 97.5 F (36.4 C)   Resp 16   Ht 5\' 10"  (1.778 m)   Wt 210 lb (95.3 kg)   SpO2 97% Comment: RA  BMI 30.13 kg/m  Physical Exam Vitals signs reviewed.  Constitutional:      General: He is not in acute distress. HENT:     Head: Normocephalic and atraumatic.  Eyes:     General: No scleral icterus.    Extraocular Movements: Extraocular movements intact.     Pupils: Pupils are equal, round, and reactive to light.  Neck:     Musculoskeletal: Neck supple.  Cardiovascular:     Rate and Rhythm: Normal rate and regular rhythm.     Pulses: Normal pulses.     Heart sounds: Normal heart sounds. No murmur. No friction rub. No gallop.   Pulmonary:     Effort: Pulmonary effort is normal. No respiratory distress.     Breath sounds: Normal breath sounds. No wheezing or rales.  Abdominal:     General: There is no distension.     Palpations: Abdomen is soft.     Tenderness: There is no abdominal tenderness.  Musculoskeletal:        General: No swelling.  Lymphadenopathy:     Cervical: No cervical adenopathy.  Skin:    General: Skin is warm and dry.  Neurological:     General: No focal deficit present.     Mental Status: He is alert and oriented to person, place, and time.     Cranial  Nerves: No cranial nerve deficit.     Motor: No weakness.    Diagnostic Tests: NUCLEAR MEDICINE PET SKULL BASE TO THIGH  TECHNIQUE: 12.5 mCi F-18 FDG was injected intravenously. Full-ring PET imaging was performed from the skull base to thigh after the radiotracer. CT data was obtained and used for attenuation correction and anatomic localization.  Fasting blood glucose: 86 mg/dl  COMPARISON:  Chest CT from 05/17/2019  FINDINGS: Mediastinal blood pool activity: SUV max 2.9  Liver activity: SUV  max 4.4  NECK: No significant abnormal hypermetabolic activity in this region.  Incidental CT findings: none  CHEST: 3.3 by 2.7 cm right infrahilar mass on image 83/4, maximum SUV 4.5. A right infrahilar lymph node measuring about 1.5 cm in short axis (previously 1.7 cm) has a maximum SUV of 4.0.  Small right hilar node maximum SUV 4.0. 0.9 cm subcarinal node, maximum SUV 4.5. Small AP window lymph nodes with maximum SUV up to 3.8  Incidental CT findings: Minimal residual pneumomediastinum. Moderate cardiomegaly. Left anterior descending coronary artery atherosclerosis. 4.1 cm ascending thoracic aortic aneurysm, stable.  ABDOMEN/PELVIS: No significant abnormal hypermetabolic activity in this region.  Incidental CT findings: 2 mm right kidney upper pole nonobstructive renal calculus. Aortoiliac atherosclerotic vascular disease. Chronic borderline prominence of the appendix, as on 08/12/2011. Sigmoid colon diverticulosis. Considerable prostatomegaly.  SKELETON: No significant abnormal hypermetabolic activity in this region.  Incidental CT findings: none  IMPRESSION: 1. The dominant right lower lobe mass similar in size to the prior CT of 05/08/2019 with maximum SUV of 4.5. Enlarged right infrahilar lymph node with maximum SUV of 4.0. Both of these of greater than blood pool activity although are not as hypermetabolic as the liver. Similar activity in smaller  right hilar and subcarinal lymph nodes. Biopsies were negative for malignant cells and right hilar mass biopsy included some cytologic features suggestive of granulomatous inflammation. Granulomatous response can certainly cause accentuated metabolic activity and cannot be excluded based on current PET-CT finding; low-grade malignancy could have a similar appearance on PET-CT. 2. Minimal residual pneumomediastinum. 3. Other imaging findings of potential clinical significance: Aortic Atherosclerosis (ICD10-I70.0). Coronary atherosclerosis. Moderate cardiomegaly. Stable appearance of 4.1 cm ascending thoracic aortic aneurysm. Aortic aneurysm NOS (ICD10-I71.9). Nonobstructive right nephrolithiasis. Sigmoid colon diverticulosis. Prostatomegaly.   Electronically Signed   By: Van Clines M.D.   On: 05/24/2019 15:24 I reviewed the PET/CT images and concur with the findings noted above.  I also reviewed the CT scans from July and August.  Impression: Henry [name] is a 68 year old gentleman with a remote history of light tobacco abuse, hypertension, reflux, anemia, and BPH.  He recent was being evaluated for back pain that radiated through to his anterior abdomen.  A CT of the chest abdomen pelvis showed a 3.4 x 2.1 cm right infrahilar mass.  Bronchoscopy endobronchial ultrasound was negative although there was a hint of some granulomatous inflammation on 1 of the specimens.  No AFB or fungal cultures were sent.  He developed a pneumomediastinum and a follow-up CT showed a decrease in size of the right infrahilar mass.  However, a week later a PET/CT showed the residual mass was still hypermetabolic.  The differential diagnosis includes neoplasm, infection, and inflammation.  It could be possible that this is granulomatous inflammation.  Primary bronchogenic carcinoma is in the differential but he does not have a significant smoking history.  Certainly cancer is not been ruled out at this  point.  Granulomatous disease or sarcoidosis are also in the differential diagnosis.  Given the appearance of the film on the original CT angiogram and surrounded by blood vessels it may actually be adenopathy.  The area did get smaller on successive CT scans about a month apart.  This does not completely rule out the possibility of cancer although does make it less likely.  I reviewed the CT and PET/CT images with Henry [name].  We discussed the differential diagnosis.  At this point, he is scheduled to have another CT in 9 days  so I will plan to see him back after that and we can discuss whether to continue with radiographic follow-up for 2 more aggressively sample.  There is no indication for surgery at the present time.  I am concerned about his chest pain.  He has the pain on a regular basis with exertion.  It is relieved by rest.  He can sometimes work through the pain but that can be seen with ischemic preconditioning.  He has coronary calcification on his CT.  It was not a CT set up for looking at the coronaries, so we do not have any quantification.  I do think he should be evaluated by cardiology.  He will discuss that with his primary care.  Plan: QuantiFERON gold and fungal antigen testing Return after repeat CT on 07/06/2019  Melrose Nakayama, MD [facility] [phone]

## 2019-06-29 ENCOUNTER — Other Ambulatory Visit: Payer: Self-pay | Admitting: Thoracic Surgery (Cardiothoracic Vascular Surgery)

## 2019-06-30 ENCOUNTER — Telehealth (HOSPITAL_COMMUNITY): Payer: Self-pay | Admitting: *Deleted

## 2019-06-30 LAB — QUANTIFERON-TB GOLD PLUS
Mitogen-NIL: >10 IU/mL
NIL: 0.03 IU/mL
QuantiFERON-TB Gold Plus: NEGATIVE
TB1-NIL: <0 IU/mL
TB2-NIL: <0 IU/mL

## 2019-06-30 NOTE — Telephone Encounter (Signed)
Pt left VM stating pulmonary referred him to our clinic and he was calling to schedule appt. Called pt back to inform him we do not have a referral for him and that he needs to contact pulmonary pt did not answer/left vm.

## 2019-07-03 LAB — CRYPTOCOCCAL AG, LTX SCR RFLX TITER
Cryptococcal Ag Screen: NOT DETECTED
MICRO NUMBER:: 894857
SPECIMEN QUALITY:: ADEQUATE

## 2019-07-03 LAB — ASPERGILLUS ANTIGEN,SERUM
Aspergillus Ag, EIA: NOT DETECTED
Index Value: 0.06 (ref <0.50)

## 2019-07-03 LAB — HISTOPLASMA ANTIGEN (BLD, CSF, BRONCH WASH, OTHER)
Interpretation:: NEGATIVE
RESULT:: NOT DETECTED ng/mL

## 2019-07-04 ENCOUNTER — Telehealth: Payer: Self-pay | Admitting: Pulmonary Disease

## 2019-07-04 DIAGNOSIS — R0609 Other forms of dyspnea: Secondary | ICD-10-CM

## 2019-07-04 DIAGNOSIS — R0600 Dyspnea, unspecified: Secondary | ICD-10-CM

## 2019-07-04 NOTE — Telephone Encounter (Signed)
07/04/2019 1501  I have placed a cardiology referral as they are requesting that we have the formal documentation.  Patient was previously seen by cardiothoracic surgery as well as our pulmonary practice.  Cardiothoracic surgery would like to have the patient further evaluated by cardiology as he continues to have ongoing dyspnea.  Patient has stable pulmonary function testing.  Patient has ongoing dyspnea with exertion as well as exertional chest pain.  Patient currently being followed by our office for a lung mass.  Potentially will have surgical intervention based off a follow-up CT chest being performed this month.  I would like to have the patient evaluated by cardiology urgently.  Prefer Dr. Angelena Form, Dr. Marlou Porch or Dr. Aundra Dubin to see patient.  [name] FNP

## 2019-07-04 NOTE — Telephone Encounter (Signed)
Referral has been placed.  Will close encounter.

## 2019-07-06 ENCOUNTER — Ambulatory Visit (INDEPENDENT_AMBULATORY_CARE_PROVIDER_SITE_OTHER)
Admission: RE | Admit: 2019-07-06 | Discharge: 2019-07-06 | Disposition: A | Discharge status: Home / Self Care | Payer: Medicare Other | Source: Ambulatory Visit | Attending: Pulmonary Disease | Admitting: Pulmonary Disease

## 2019-07-06 ENCOUNTER — Other Ambulatory Visit: Payer: Self-pay

## 2019-07-06 DIAGNOSIS — R911 Solitary pulmonary nodule: Secondary | ICD-10-CM

## 2019-07-06 DIAGNOSIS — R918 Other nonspecific abnormal finding of lung field: Secondary | ICD-10-CM | POA: Diagnosis not present

## 2019-07-06 NOTE — Progress Notes (Signed)
CT results have come back.  Masslike area along the right pulmonary vein on previous exam has reduced and is now measuring 1.2 x 0.9 cm.  This had previously been measured at 2.6 x 1.6 cm in August/2020.  Right hilar lymph node also has decreased.  This is good news.  I will follow-up with Dr. Valeta Harms as well as Dr. Roxan Hockey regarding these results.  For right now we will tentatively plan on having a repeat CT chest with contrast in 6 to 8 weeks.  Will need to have a bmet lab test to check kidney functioning prior to you having the CT chest with contrast.  Please go ahead and place order for CT chest with contrast to be completed in 6 to 8 weeks.  You can associate this with lung mass as well as abnormal lung imaging.  Please also place order for basic metabolic panel you can associate this with abnormal lung imaging.  [name], FNP

## 2019-07-07 ENCOUNTER — Other Ambulatory Visit: Payer: Self-pay | Admitting: *Deleted

## 2019-07-07 DIAGNOSIS — R911 Solitary pulmonary nodule: Secondary | ICD-10-CM

## 2019-07-07 DIAGNOSIS — R918 Other nonspecific abnormal finding of lung field: Secondary | ICD-10-CM

## 2019-07-18 ENCOUNTER — Ambulatory Visit: Payer: Medicare Other | Admitting: Thoracic Surgery (Cardiothoracic Vascular Surgery)

## 2019-08-07 ENCOUNTER — Encounter: Payer: Self-pay | Admitting: Cardiology

## 2019-08-08 ENCOUNTER — Encounter: Payer: Self-pay | Admitting: Cardiology

## 2019-08-08 ENCOUNTER — Ambulatory Visit (INDEPENDENT_AMBULATORY_CARE_PROVIDER_SITE_OTHER): Payer: Medicare Other | Admitting: Cardiology

## 2019-08-08 ENCOUNTER — Other Ambulatory Visit: Payer: Self-pay

## 2019-08-08 VITALS — BP 140/70 | HR 73 | Ht 70.0 in | Wt 210.8 lb

## 2019-08-08 DIAGNOSIS — Z79899 Other long term (current) drug therapy: Secondary | ICD-10-CM

## 2019-08-08 DIAGNOSIS — I2510 Atherosclerotic heart disease of native coronary artery without angina pectoris: Secondary | ICD-10-CM | POA: Diagnosis not present

## 2019-08-08 DIAGNOSIS — I209 Angina pectoris, unspecified: Secondary | ICD-10-CM

## 2019-08-08 DIAGNOSIS — I2584 Coronary atherosclerosis due to calcified coronary lesion: Secondary | ICD-10-CM

## 2019-08-08 DIAGNOSIS — R0602 Shortness of breath: Secondary | ICD-10-CM | POA: Diagnosis not present

## 2019-08-08 DIAGNOSIS — Z01812 Encounter for preprocedural laboratory examination: Secondary | ICD-10-CM

## 2019-08-08 DIAGNOSIS — E785 Hyperlipidemia, unspecified: Secondary | ICD-10-CM

## 2019-08-08 MED ORDER — ROSUVASTATIN CALCIUM 10 MG PO TABS: 10.0000 mg | ORAL_TABLET | Freq: Every day | ORAL | 3 refills | Status: DC

## 2019-08-08 MED ORDER — ASPIRIN EC 81 MG PO TBEC: 81.0000 mg | DELAYED_RELEASE_TABLET | Freq: Every day | ORAL | 3 refills | Status: DC

## 2019-08-08 MED ORDER — NITROGLYCERIN 0.4 MG SL SUBL: 0.4000 mg | SUBLINGUAL_TABLET | SUBLINGUAL | 3 refills | Status: DC | PRN

## 2019-08-08 NOTE — Patient Instructions (Signed)
Medication Instructions:  Please start ASA 81 mg a day.  Start Crestor 10 mg a day.  Continue all other medications as listed. You may use SL Nitroglycerin 0.4 as directed if chest pain occurs.  *If you need a refill on your cardiac medications before your next appointment, please call your pharmacy*  Lab Work: Please have blood work today (BMP,CBC) and in 3 months to check Lipid/ALT.   If you have labs (blood work) drawn today and your tests are completely normal, you will receive your results only by: Marland Kitchen MyChart Message (if you have MyChart) OR . A paper copy in the mail If you have any lab test that is abnormal or we need to change your treatment, we will call you to review the results.  Testing/Procedures: Your physician has requested that you have an echocardiogram. Echocardiography is a painless test that uses sound waves to create images of your heart. It provides your doctor with information about the size and shape of your heart and how well your heart's chambers and valves are working. This procedure takes approximately one hour. There are no restrictions for this procedure.  Your physician has requested that you have a cardiac catheterization. Cardiac catheterization is used to diagnose and/or treat various heart conditions. Doctors may recommend this procedure for a number of different reasons. The most common reason is to evaluate chest pain. Chest pain can be a symptom of coronary artery disease (CAD), and cardiac catheterization can show whether plaque is narrowing or blocking your heart's arteries. This procedure is also used to evaluate the valves, as well as measure the blood flow and oxygen levels in different parts of your heart. For further information please visit [email or web address].[email or web address]. Please follow instruction sheet, as given.  Follow-Up: At [facility], you and your health needs are our priority.  As part of our continuing mission to provide you with exceptional heart  care, we have created designated Provider Care Teams.  These Care Teams include your primary Cardiologist (physician) and Advanced Practice Providers (APPs -  Physician Assistants and Nurse Practitioners) who all work together to provide you with the care you need, when you need it.  Your next appointment:   3 months  The format for your next appointment:   In Person  Provider:   Kathyrn Drown, NP   Thank you for choosing [facility]!!      [facility] [place], [place] [address] Dept: [phone] Loc: [place]  08/08/2019  You are scheduled for a cardiac cath on Friday, October 30 with Dr. Irish Lack.  1. Please arrive at the [facility] (Main Entrance A) at [facility]: [address] [place],  [address] at 5:30 am (two hours before your procedure to ensure your preparation). Free valet parking service is available.   Special note: Every effort is made to have your procedure done on time. Please understand that emergencies sometimes delay scheduled procedures.  2. Diet: Nothing to eat or drink after midnight the night before your      procedure.  3. Labs:  Have today     You will need COVID testing before your cath.  This will be completed       at [place] as scheduled.  4. Medication instructions in preparation for your procedure:  On the morning of your procedure, you make take your medications as normal.  You may use sips of water.  5. Plan for one night stay--bring personal belongings. 6. Bring a current list of your medications and current insurance cards. 7. You MUST have a responsible person to drive you home. 8. Someone MUST be with you the first 24 hours after you arrive home or your discharge will be delayed. 9. Please wear clothes that are easy to get on and off and wear slip-on shoes.  Thank you for  allowing [place] to care for you!   -- [facility]

## 2019-08-08 NOTE — Progress Notes (Signed)
Cardiology Office Note:    Date:  08/08/2019   ID:  SIGFREDO BARGAR, DOB 08-13-1951, MRN [id number]  PCP:  [facility]  Cardiologist:  Candee Furbish, MD  Electrophysiologist:  None   Referring MD: Pa, [facility]*     History of Present Illness:    Henry [name] is a 68 y.o. male for the evaluation of dyspnea at the request of Dr. Roxan Hockey and [name], NP with pulmonary.  In review of a telephone note from 07/04/2019 he was previously seen by cardiothoracic surgery as well as pulmonary practice and cardiothoracic surgery wanted him to have further evaluation by cardiology for continued ongoing dyspnea.  He has had stable pulmonary function testing.  He has had ongoing dyspnea with exertion as well as exertional chest pain concerning for potential angina.  He has been followed by pulmonary for a lung mass with potential for possible surgical intervention however the mass seems to be decreasing in size and they will continue to follow this with CT.  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  Chest pain was previously described by Dr. Roxan Hockey as pain that occurred when he first exerted himself but then resolved after 5 to 10 minutes of rest.  He will work through the pain and it eventually go away and he is able to complete about a 2 mile cycle.  Decreased energy noted. He questions if this is GERD but he thinks it is much worse. Noted on stairs as well. Mild SOB with this. No radiation. No syncope. Hemmoroids, sometines bleed.   Not smoking, quit 35 years ago.   Pain in legs at night has to move them.   Coronary atherosclerosis was noted on CT scan as well as aortic atherosclerosis.  4.1 cm ascending aortic dilatation noted.  CT scan personally reviewed shows dense coronary artery calcification in the LAD distribution.     Past Medical History:  Diagnosis Date  . Anemia    low iron  . BPH (benign prostatic hypertrophy)   . Dyspnea    "a little bit" with exertion  .  Frequency of urination   . GERD (gastroesophageal reflux disease)    occasionally uses tums or 1 tbsp vinager  . History of bladder stone   . History of kidney stones   . Hypertension   . Nocturia   . Pneumonia   . Wears glasses     Past Surgical History:  Procedure Laterality Date  . CYSTO/  LEFT URETEROSCOPIC STONE EXTRACTION/  BLADDER  STONE EXTRACTION  02-15-2009  . CYSTOSCOPY WITH LITHOLAPAXY N/A 09/29/2013   Procedure: CYSTOSCOPY WITH LITHOLAPAXY;  Surgeon: Franchot Gallo, MD;  Location: [facility];  Service: Urology;  Laterality: N/A;  . LUMBAR [place] SURGERY  09-19-2000   LEFT  L4 -- L5  . REPAIR RIGHT HAND SOFT TISSUE STRUCTURES  02-24-2000   WORK INJURY /  PARTIAL AMPUTATION RIGHT INDEX FINGER  . TRANSURETHRAL RESECTION OF PROSTATE N/A 09/29/2013   Procedure: TRANSURETHRAL RESECTION OF THE PROSTATE WITH GYRUS INSTRUMENTS;  Surgeon: Franchot Gallo, MD;  Location: [facility];  Service: Urology;  Laterality: N/A;  . VIDEO BRONCHOSCOPY WITH ENDOBRONCHIAL ULTRASOUND N/A 05/16/2019   Procedure: VIDEO BRONCHOSCOPY WITH ENDOBRONCHIAL ULTRASOUND;  Surgeon: Garner Nash, DO;  Location: [facility] OR;  Service: Thoracic;  Laterality: N/A;    Current Medications: Current Meds  Medication Sig  . Ascorbic Acid (VITAMIN C) 1000 MG tablet Take 1,000 mg by mouth daily.  . Cholecalciferol (  VITAMIN D3) 50 MCG (2000 UT) capsule Take 2,000 mg by mouth daily.   . metoprolol succinate (TOPROL-XL) 50 MG 24 hr tablet Take 50 mg by mouth daily.  Marland Kitchen omeprazole (PRILOSEC) 40 MG capsule Take 40 mg by mouth daily before breakfast.      Allergies:   Patient has no known allergies.   Social History   Socioeconomic History  . Marital status: Married    Spouse name: Not on file  . Number of children: Not on file  . Years of education: Not on file  . Highest education level: Not on file  Occupational History  . 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 Financial resource strain: Not on file  . Food insecurity    Worry: Not on file    Inability: Not on file  . Transportation needs    Medical: Not on file    Non-medical: Not on file  Tobacco Use  . Smoking status: Former Smoker    Packs/day: 1.00    Years: 20.00    Pack years: 20.00    Types: Cigarettes    Quit date: 09/29/1983    Years since quitting: 35.8  . Smokeless tobacco: Never Used  Substance and Sexual Activity  . Alcohol use: Yes    Comment: rare  . Drug use: No  . Sexual activity: Not on file  Lifestyle  . Physical activity    Days per week: Not on file    Minutes per session: Not on file  . Stress: Not on file  Relationships  . Social Herbalist on phone: Not on file    Gets together: Not on file    Attends religious service: Not on file    Active member of club or organization: Not on file    Attends meetings of clubs or organizations: Not on file    Relationship status: Not on file  Other Topics Concern  . Not on file  Social History Narrative  . Not on file     Family History: The patient's family history is not on file. Mother's father had MI.   ROS:   Please see the history of present illness.     All other systems reviewed and are negative.  EKGs/Labs/Other Studies Reviewed:    The following studies were reviewed today: CT chest - showed him pictures  EKG:  05/10/19 - SR mild first degree AVB, LBBB like.   Recent Labs: 05/08/2019: ALT 17 05/17/2019: Hemoglobin 13.1; Platelets 181 05/18/2019: BUN 20; Creatinine, Ser 0.94; Potassium 4.1; Sodium 139  Recent Lipid Panel No results found for: CHOL, TRIG, HDL, CHOLHDL, VLDL, LDLCALC, LDLDIRECT  Physical Exam:    VS:  BP 140/70   Pulse 73   Ht 5\' 10"  (1.778 m)   Wt 210 lb 12.8 oz (95.6 kg)   SpO2 98%   BMI 30.25 kg/m     Wt Readings from Last 3 Encounters:  08/08/19 210 lb 12.8 oz (95.6 kg)  06/27/19 210 lb (95.3 kg)   05/26/19 211 lb 9.6 oz (96 kg)     GEN:  Well nourished, well developed in no acute distress HEENT: Normal NECK: No JVD; No carotid bruits LYMPHATICS: No lymphadenopathy CARDIAC: RRR, ?diastolic murmurs, rubs, gallops RESPIRATORY:  Clear to auscultation without rales, wheezing or rhonchi  ABDOMEN: Soft, non-tender, non-distended MUSCULOSKELETAL:  No edema; No deformity  SKIN: Warm and  dry NEUROLOGIC:  Alert and oriented x 3 PSYCHIATRIC:  Normal affect   ASSESSMENT:    1. Coronary artery calcification   2. Angina pectoris ([place])   3. Long-term use of high-risk medication   4. Shortness of breath   5. Hyperlipidemia, unspecified hyperlipidemia type   6. Pre-procedure lab exam    PLAN:    In order of problems listed above:  Dense coronary artery calcification in the LAD distribution with exertional chest discomfort relieved with rest concerning for angina -We will proceed with cardiac catheterization.  Risks and benefits have been explained including stroke heart attack death renal impairment bleeding.  He is willing to proceed.  Pulmonary and cardiothoracic surgery has been monitoring a right hilar mass which has been decreasing in size on most recent CT scan.  If intervention/PCI were needed, we will forward this note to Dr. Roxan Hockey for his opinion regarding need for dual antiplatelet therapy etc. obviously, if there is surgical disease present he will be involved.  - Start ASA 81mg  PO QD  - Start Crestor 10mg  PO QD   - Continue metoprolol  - NTG 0.4 SL PRN  - Check ECHO as well.   - Refrain from intense physical activity.   3 month follow up lipids, ALT and visit with Sharee Pimple.      Medication Adjustments/Labs and Tests Ordered: Current medicines are reviewed at length with the patient today.  Concerns regarding medicines are outlined above.  Orders Placed This Encounter  Procedures  . ALT  . Lipid panel  . Basic metabolic panel  . CBC  . ECHOCARDIOGRAM COMPLETE    Meds ordered this encounter  Medications  . aspirin EC 81 MG tablet    Sig: Take 1 tablet (81 mg total) by mouth daily.    Dispense:  90 tablet    Refill:  3  . rosuvastatin (CRESTOR) 10 MG tablet    Sig: Take 1 tablet (10 mg total) by mouth daily.    Dispense:  90 tablet    Refill:  3  . nitroGLYCERIN (NITROSTAT) 0.4 MG SL tablet    Sig: Place 1 tablet (0.4 mg total) under the tongue every 5 (five) minutes as needed for chest pain.    Dispense:  25 tablet    Refill:  3    Patient Instructions  Medication Instructions:  Please start ASA 81 mg a day.  Start Crestor 10 mg a day.  Continue all other medications as listed. You may use SL Nitroglycerin 0.4 as directed if chest pain occurs.  *If you need a refill on your cardiac medications before your next appointment, please call your pharmacy*  Lab Work: Please have blood work today (BMP,CBC) and in 3 months to check Lipid/ALT.   If you have labs (blood work) drawn today and your tests are completely normal, you will receive your results only by: Marland Kitchen MyChart Message (if you have MyChart) OR . A paper copy in the mail If you have any lab test that is abnormal or we need to change your treatment, we will call you to review the results.  Testing/Procedures: Your physician has requested that you have an echocardiogram. Echocardiography is a painless test that uses sound waves to create images of your heart. It provides your doctor with information about the size and shape of your heart and how well your heart's chambers and valves are working. This procedure takes approximately one hour. There are no restrictions for this procedure.  Your physician has requested that  you have a cardiac catheterization. Cardiac catheterization is used to diagnose and/or treat various heart conditions. Doctors may recommend this procedure for a number of different reasons. The most common reason is to evaluate chest pain. Chest pain can be a symptom of  coronary artery disease (CAD), and cardiac catheterization can show whether plaque is narrowing or blocking your heart's arteries. This procedure is also used to evaluate the valves, as well as measure the blood flow and oxygen levels in different parts of your heart. For further information please visit [email or web address]. Please follow instruction sheet, as given.  Follow-Up: At [facility], you and your health needs are our priority.  As part of our continuing mission to provide you with exceptional heart care, we have created designated Provider Care Teams.  These Care Teams include your primary Cardiologist (physician) and Advanced Practice Providers (APPs -  Physician Assistants and Nurse Practitioners) who all work together to provide you with the care you need, when you need it.  Your next appointment:   3 months  The format for your next appointment:   In Person  Provider:   Kathyrn Drown, NP   Thank you for choosing [facility]!!      [facility] [place] [place] [address] Dept: [phone] Loc: [place] [facility]  08/08/2019  You are scheduled for a cardiac cath on Friday, October 30 with Dr. Irish Lack.  1. Please arrive at the [facility] (Main Entrance A) at [facility]: [address] [place], [place] [address] at 5:30 am (two hours before your procedure to ensure your preparation). Free valet parking service is available.   Special note: Every effort is made to have your procedure done on time. Please understand that emergencies sometimes delay scheduled procedures.  2. Diet: Nothing to eat or drink after midnight the night before your      procedure.  3. Labs:  Have today     You will need COVID testing before your cath.  This will be completed       at [place] as scheduled.  4. Medication instructions in  preparation for your procedure:  On the morning of your procedure, you make take your medications as normal.  You may use sips of water.  5. Plan for one night stay--bring personal belongings. 6. Bring a current list of your medications and current insurance cards. 7. You MUST have a responsible person to drive you home. 8. Someone MUST be with you the first 24 hours after you arrive home or your discharge will be delayed. 9. Please wear clothes that are easy to get on and off and wear slip-on shoes.  Thank you for allowing [place] to care for you!   -- [facility]     Signed, Candee Furbish, MD  08/08/2019 5:15 PM    [place]

## 2019-08-08 NOTE — H&P (View-Only) (Signed)
Cardiology Office Note:    Date:  08/08/2019   ID:  Henry [name], DOB 1951-07-11, MRN [id number]  PCP:  [facility]  Cardiologist:  Candee Furbish, MD  Electrophysiologist:  None   Referring MD: Pa, [facility]*     History of Present Illness:    Henry [name] is a 68 y.o. male for the evaluation of dyspnea at the request of Dr. Roxan Hockey and [name], NP with pulmonary.  In review of a telephone note from 07/04/2019 he was previously seen by cardiothoracic surgery as well as pulmonary practice and cardiothoracic surgery wanted him to have further evaluation by cardiology for continued ongoing dyspnea.  He has had stable pulmonary function testing.  He has had ongoing dyspnea with exertion as well as exertional chest pain concerning for potential angina.  He has been followed by pulmonary for a lung mass with potential for possible surgical intervention however the mass seems to be decreasing in size and they will continue to follow this with CT.  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  Chest pain was previously described by Dr. Roxan Hockey as pain that occurred when he first exerted himself but then resolved after 5 to 10 minutes of rest.  He will work through the pain and it eventually go away and he is able to complete about a 2 mile cycle.  Decreased energy noted. He questions if this is GERD but he thinks it is much worse. Noted on stairs as well. Mild SOB with this. No radiation. No syncope. Hemmoroids, sometines bleed.   Not smoking, quit 35 years ago.   Pain in legs at night has to move them.   Coronary atherosclerosis was noted on CT scan as well as aortic atherosclerosis.  4.1 cm ascending aortic dilatation noted.  CT scan personally reviewed shows dense coronary artery calcification in the LAD distribution.     Past Medical History:  Diagnosis Date  . Anemia    low iron  . BPH (benign prostatic hypertrophy)   . Dyspnea    "a little bit" with exertion  .  Frequency of urination   . GERD (gastroesophageal reflux disease)    occasionally uses tums or 1 tbsp vinager  . History of bladder stone   . History of kidney stones   . Hypertension   . Nocturia   . Pneumonia   . Wears glasses     Past Surgical History:  Procedure Laterality Date  . CYSTO/  LEFT URETEROSCOPIC STONE EXTRACTION/  BLADDER  STONE EXTRACTION  02-15-2009  . CYSTOSCOPY WITH LITHOLAPAXY N/A 09/29/2013   Procedure: CYSTOSCOPY WITH LITHOLAPAXY;  Surgeon: Franchot Gallo, MD;  Location: [facility];  Service: Urology;  Laterality: N/A;  . LUMBAR [place] SURGERY  09-19-2000   LEFT  L4 -- L5  . REPAIR RIGHT HAND SOFT TISSUE STRUCTURES  02-24-2000   WORK INJURY /  PARTIAL AMPUTATION RIGHT INDEX FINGER  . TRANSURETHRAL RESECTION OF PROSTATE N/A 09/29/2013   Procedure: TRANSURETHRAL RESECTION OF THE PROSTATE WITH GYRUS INSTRUMENTS;  Surgeon: Franchot Gallo, MD;  Location: [facility];  Service: Urology;  Laterality: N/A;  . VIDEO BRONCHOSCOPY WITH ENDOBRONCHIAL ULTRASOUND N/A 05/16/2019   Procedure: VIDEO BRONCHOSCOPY WITH ENDOBRONCHIAL ULTRASOUND;  Surgeon: Garner Nash, DO;  Location: [facility] OR;  Service: Thoracic;  Laterality: N/A;    Current Medications: Current Meds  Medication Sig  . Ascorbic Acid (VITAMIN C) 1000 MG tablet Take 1,000 mg by mouth daily.  . Cholecalciferol (  VITAMIN D3) 50 MCG (2000 UT) capsule Take 2,000 mg by mouth daily.   . metoprolol succinate (TOPROL-XL) 50 MG 24 hr tablet Take 50 mg by mouth daily.  Marland Kitchen omeprazole (PRILOSEC) 40 MG capsule Take 40 mg by mouth daily before breakfast.      Allergies:   Patient has no known allergies.   Social History   Socioeconomic History  . Marital status: Married    Spouse name: Not on file  . Number of children: Not on file  . Years of education: Not on file  . Highest education level: Not on file  Occupational History  . 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 Financial resource strain: Not on file  . Food insecurity    Worry: Not on file    Inability: Not on file  . Transportation needs    Medical: Not on file    Non-medical: Not on file  Tobacco Use  . Smoking status: Former Smoker    Packs/day: 1.00    Years: 20.00    Pack years: 20.00    Types: Cigarettes    Quit date: 09/29/1983    Years since quitting: 35.8  . Smokeless tobacco: Never Used  Substance and Sexual Activity  . Alcohol use: Yes    Comment: rare  . Drug use: No  . Sexual activity: Not on file  Lifestyle  . Physical activity    Days per week: Not on file    Minutes per session: Not on file  . Stress: Not on file  Relationships  . Social Herbalist on phone: Not on file    Gets together: Not on file    Attends religious service: Not on file    Active member of club or organization: Not on file    Attends meetings of clubs or organizations: Not on file    Relationship status: Not on file  Other Topics Concern  . Not on file  Social History Narrative  . Not on file     Family History: The patient's family history is not on file. Mother's father had MI.   ROS:   Please see the history of present illness.     All other systems reviewed and are negative.  EKGs/Labs/Other Studies Reviewed:    The following studies were reviewed today: CT chest - showed him pictures  EKG:  05/10/19 - SR mild first degree AVB, LBBB like.   Recent Labs: 05/08/2019: ALT 17 05/17/2019: Hemoglobin 13.1; Platelets 181 05/18/2019: BUN 20; Creatinine, Ser 0.94; Potassium 4.1; Sodium 139  Recent Lipid Panel No results found for: CHOL, TRIG, HDL, CHOLHDL, VLDL, LDLCALC, LDLDIRECT  Physical Exam:    VS:  BP 140/70   Pulse 73   Ht 5\' 10"  (1.778 m)   Wt 210 lb 12.8 oz (95.6 kg)   SpO2 98%   BMI 30.25 kg/m     Wt Readings from Last 3 Encounters:  08/08/19 210 lb 12.8 oz (95.6 kg)  06/27/19 210 lb (95.3 kg)   05/26/19 211 lb 9.6 oz (96 kg)     GEN:  Well nourished, well developed in no acute distress HEENT: Normal NECK: No JVD; No carotid bruits LYMPHATICS: No lymphadenopathy CARDIAC: RRR, ?diastolic murmurs, rubs, gallops RESPIRATORY:  Clear to auscultation without rales, wheezing or rhonchi  ABDOMEN: Soft, non-tender, non-distended MUSCULOSKELETAL:  No edema; No deformity  SKIN: Warm and  dry NEUROLOGIC:  Alert and oriented x 3 PSYCHIATRIC:  Normal affect   ASSESSMENT:    1. Coronary artery calcification   2. Angina pectoris ([place])   3. Long-term use of high-risk medication   4. Shortness of breath   5. Hyperlipidemia, unspecified hyperlipidemia type   6. Pre-procedure lab exam    PLAN:    In order of problems listed above:  Dense coronary artery calcification in the LAD distribution with exertional chest discomfort relieved with rest concerning for angina -We will proceed with cardiac catheterization.  Risks and benefits have been explained including stroke heart attack death renal impairment bleeding.  He is willing to proceed.  Pulmonary and cardiothoracic surgery has been monitoring a right hilar mass which has been decreasing in size on most recent CT scan.  If intervention/PCI were needed, we will forward this note to Dr. Roxan Hockey for his opinion regarding need for dual antiplatelet therapy etc. obviously, if there is surgical disease present he will be involved.  - Start ASA 81mg  PO QD  - Start Crestor 10mg  PO QD   - Continue metoprolol  - NTG 0.4 SL PRN  - Check ECHO as well.   - Refrain from intense physical activity.   3 month follow up lipids, ALT and visit with Sharee Pimple.      Medication Adjustments/Labs and Tests Ordered: Current medicines are reviewed at length with the patient today.  Concerns regarding medicines are outlined above.  Orders Placed This Encounter  Procedures  . ALT  . Lipid panel  . Basic metabolic panel  . CBC  . ECHOCARDIOGRAM COMPLETE    Meds ordered this encounter  Medications  . aspirin EC 81 MG tablet    Sig: Take 1 tablet (81 mg total) by mouth daily.    Dispense:  90 tablet    Refill:  3  . rosuvastatin (CRESTOR) 10 MG tablet    Sig: Take 1 tablet (10 mg total) by mouth daily.    Dispense:  90 tablet    Refill:  3  . nitroGLYCERIN (NITROSTAT) 0.4 MG SL tablet    Sig: Place 1 tablet (0.4 mg total) under the tongue every 5 (five) minutes as needed for chest pain.    Dispense:  25 tablet    Refill:  3    Patient Instructions  Medication Instructions:  Please start ASA 81 mg a day.  Start Crestor 10 mg a day.  Continue all other medications as listed. You may use SL Nitroglycerin 0.4 as directed if chest pain occurs.  *If you need a refill on your cardiac medications before your next appointment, please call your pharmacy*  Lab Work: Please have blood work today (BMP,CBC) and in 3 months to check Lipid/ALT.   If you have labs (blood work) drawn today and your tests are completely normal, you will receive your results only by: Marland Kitchen MyChart Message (if you have MyChart) OR . A paper copy in the mail If you have any lab test that is abnormal or we need to change your treatment, we will call you to review the results.  Testing/Procedures: Your physician has requested that you have an echocardiogram. Echocardiography is a painless test that uses sound waves to create images of your heart. It provides your doctor with information about the size and shape of your heart and how well your heart's chambers and valves are working. This procedure takes approximately one hour. There are no restrictions for this procedure.  Your physician has requested that  you have a cardiac catheterization. Cardiac catheterization is used to diagnose and/or treat various heart conditions. Doctors may recommend this procedure for a number of different reasons. The most common reason is to evaluate chest pain. Chest pain can be a symptom of  coronary artery disease (CAD), and cardiac catheterization can show whether plaque is narrowing or blocking your heart's arteries. This procedure is also used to evaluate the valves, as well as measure the blood flow and oxygen levels in different parts of your heart. For further information please visit [email or web address]. Please follow instruction sheet, as given.  Follow-Up: At [facility], you and your health needs are our priority.  As part of our continuing mission to provide you with exceptional heart care, we have created designated Provider Care Teams.  These Care Teams include your primary Cardiologist (physician) and Advanced Practice Providers (APPs -  Physician Assistants and Nurse Practitioners) who all work together to provide you with the care you need, when you need it.  Your next appointment:   3 months  The format for your next appointment:   In Person  Provider:   Kathyrn Drown, NP   Thank you for choosing [facility]!!      [facility] OFFICE [place], [address] [place] [address] Dept: [phone] Loc: [place]  08/08/2019  You are scheduled for a cardiac cath on Friday, October 30 with Dr. Irish Lack.  1. Please arrive at the [facility] (Main Entrance A) at [facility]: [address] [place], [place] [address] at 5:30 am (two hours before your procedure to ensure your preparation). Free valet parking service is available.   Special note: Every effort is made to have your procedure done on time. Please understand that emergencies sometimes delay scheduled procedures.  2. Diet: Nothing to eat or drink after midnight the night before your      procedure.  3. Labs:  Have today     You will need COVID testing before your cath.  This will be completed       at [facility] as scheduled.  4. Medication instructions in  preparation for your procedure:  On the morning of your procedure, you make take your medications as normal.  You may use sips of water.  5. Plan for one night stay--bring personal belongings. 6. Bring a current list of your medications and current insurance cards. 7. You MUST have a responsible person to drive you home. 8. Someone MUST be with you the first 24 hours after you arrive home or your discharge will be delayed. 9. Please wear clothes that are easy to get on and off and wear slip-on shoes.  Thank you for allowing [place] to care for you!   -- [facility]     Signed, Candee Furbish, MD  08/08/2019 5:15 PM    [place]

## 2019-08-09 ENCOUNTER — Other Ambulatory Visit: Payer: Self-pay | Admitting: *Deleted

## 2019-08-09 ENCOUNTER — Other Ambulatory Visit (HOSPITAL_COMMUNITY)
Admission: RE | Admit: 2019-08-09 | Discharge: 2019-08-09 | Disposition: A | Discharge status: Home / Self Care | Payer: Medicare Other | Source: Ambulatory Visit | Attending: Interventional Cardiology | Admitting: Interventional Cardiology

## 2019-08-09 DIAGNOSIS — R911 Solitary pulmonary nodule: Secondary | ICD-10-CM

## 2019-08-09 DIAGNOSIS — Z20828 Contact with and (suspected) exposure to other viral communicable diseases: Secondary | ICD-10-CM | POA: Diagnosis not present

## 2019-08-09 DIAGNOSIS — Z01812 Encounter for preprocedural laboratory examination: Secondary | ICD-10-CM | POA: Diagnosis present

## 2019-08-09 LAB — BASIC METABOLIC PANEL
BUN/Creatinine Ratio: 18 (ref 10–24)
BUN: 16 mg/dL (ref 8–27)
CO2: 27 mmol/L (ref 20–29)
Calcium: 8.7 mg/dL (ref 8.6–10.2)
Chloride: 104 mmol/L (ref 96–106)
Creatinine, Ser: 0.87 mg/dL (ref 0.76–1.27)
GFR calc Af Amer: 103 mL/min/{1.73_m2} (ref >59)
GFR calc non Af Amer: 89 mL/min/{1.73_m2} (ref >59)
Glucose: 84 mg/dL (ref 65–99)
Potassium: 4.3 mmol/L (ref 3.5–5.2)
Sodium: 141 mmol/L (ref 134–144)

## 2019-08-09 LAB — SARS CORONAVIRUS 2 (TAT 6-24 HRS): SARS Coronavirus 2: NEGATIVE

## 2019-08-09 LAB — CBC
Hematocrit: 41 % (ref 37.5–51.0)
Hemoglobin: 13.3 g/dL (ref 13.0–17.7)
MCH: 29.5 pg (ref 26.6–33.0)
MCHC: 32.4 g/dL (ref 31.5–35.7)
MCV: 91 fL (ref 79–97)
Platelets: 150 10*3/uL (ref 150–450)
RBC: 4.51 x10E6/uL (ref 4.14–5.80)
RDW: 14.2 % (ref 11.6–15.4)
WBC: 5.5 10*3/uL (ref 3.4–10.8)

## 2019-08-10 ENCOUNTER — Telehealth: Payer: Self-pay | Admitting: *Deleted

## 2019-08-10 NOTE — Telephone Encounter (Signed)
Pt contacted pre-catheterization scheduled at [facility] for: Friday August 11, 2019 7:30 AM Verified arrival time and place: [facility]) at: 5:30 AM   No solid food after midnight prior to cath, clear liquids until 5 AM day of procedure. Contrast allergy: no   AM meds can be  taken pre-cath with sip of water including: ASA 81 mg   Confirmed patient has responsible adult to drive home post procedure and observe 24 hours after arriving home: yes  Currently, due to Covid-19 pandemic, only one support person will be allowed with patient. Must be the same support person for that patient's entire stay, will be screened and required to wear a mask. They will be asked to wait in the waiting room for the duration of the patient's stay.  Patients are required to wear a mask when they enter the hospital.      COVID-19 Pre-Screening Questions:  . In the past 7 to 10 days have you had a cough,  shortness of breath, headache, congestion, fever (100 or greater) body aches, chills, sore throat, or sudden loss of taste or sense of smell? no . Have you been around anyone with known Covid 19? no . Have you been around anyone who is awaiting Covid 19 test results in the past 7 to 10 days? no . Have you been around anyone who has been exposed to Covid 19, or has mentioned symptoms of Covid 19 within the past 7 to 10 days? no  I reviewed procedure/mask/visitor instructions, Covid-19 screening questions with patient, he verbalized understanding, thanked me for call.

## 2019-08-11 ENCOUNTER — Encounter (HOSPITAL_COMMUNITY): Payer: Self-pay | Admitting: Physician Assistant

## 2019-08-11 ENCOUNTER — Ambulatory Visit (HOSPITAL_COMMUNITY)
Admission: RE | Admit: 2019-08-11 | Discharge: 2019-08-11 | Disposition: A | Discharge status: Home / Self Care | Payer: Medicare Other | Attending: Interventional Cardiology | Admitting: Interventional Cardiology

## 2019-08-11 ENCOUNTER — Other Ambulatory Visit: Payer: Self-pay

## 2019-08-11 ENCOUNTER — Encounter (HOSPITAL_COMMUNITY)
Admission: RE | Disposition: A | Discharge status: Home / Self Care | Payer: Self-pay | Source: Home / Self Care | Attending: Interventional Cardiology

## 2019-08-11 DIAGNOSIS — Z7902 Long term (current) use of antithrombotics/antiplatelets: Secondary | ICD-10-CM | POA: Insufficient documentation

## 2019-08-11 DIAGNOSIS — R0602 Shortness of breath: Secondary | ICD-10-CM | POA: Diagnosis not present

## 2019-08-11 DIAGNOSIS — I10 Essential (primary) hypertension: Secondary | ICD-10-CM | POA: Insufficient documentation

## 2019-08-11 DIAGNOSIS — I2510 Atherosclerotic heart disease of native coronary artery without angina pectoris: Secondary | ICD-10-CM | POA: Diagnosis present

## 2019-08-11 DIAGNOSIS — K219 Gastro-esophageal reflux disease without esophagitis: Secondary | ICD-10-CM | POA: Diagnosis not present

## 2019-08-11 DIAGNOSIS — Z955 Presence of coronary angioplasty implant and graft: Secondary | ICD-10-CM | POA: Diagnosis not present

## 2019-08-11 DIAGNOSIS — I25119 Atherosclerotic heart disease of native coronary artery with unspecified angina pectoris: Secondary | ICD-10-CM | POA: Diagnosis present

## 2019-08-11 DIAGNOSIS — N400 Enlarged prostate without lower urinary tract symptoms: Secondary | ICD-10-CM | POA: Insufficient documentation

## 2019-08-11 DIAGNOSIS — Z95820 Peripheral vascular angioplasty status with implants and grafts: Secondary | ICD-10-CM

## 2019-08-11 DIAGNOSIS — Z7982 Long term (current) use of aspirin: Secondary | ICD-10-CM | POA: Diagnosis not present

## 2019-08-11 DIAGNOSIS — I209 Angina pectoris, unspecified: Secondary | ICD-10-CM

## 2019-08-11 DIAGNOSIS — E785 Hyperlipidemia, unspecified: Secondary | ICD-10-CM | POA: Diagnosis not present

## 2019-08-11 DIAGNOSIS — Z79899 Other long term (current) drug therapy: Secondary | ICD-10-CM | POA: Insufficient documentation

## 2019-08-11 DIAGNOSIS — Z87891 Personal history of nicotine dependence: Secondary | ICD-10-CM | POA: Diagnosis not present

## 2019-08-11 HISTORY — PX: INTRAVASCULAR ULTRASOUND/IVUS: CATH118244

## 2019-08-11 HISTORY — DX: Atherosclerotic heart disease of native coronary artery without angina pectoris: I25.10

## 2019-08-11 HISTORY — DX: Presence of coronary angioplasty implant and graft: Z95.5

## 2019-08-11 HISTORY — PX: LEFT HEART CATH AND CORONARY ANGIOGRAPHY: CATH118249

## 2019-08-11 HISTORY — PX: CORONARY ATHERECTOMY: CATH118238

## 2019-08-11 LAB — POCT ACTIVATED CLOTTING TIME
Activated Clotting Time: 235 seconds
Activated Clotting Time: 313 seconds
Activated Clotting Time: 274 seconds

## 2019-08-11 SURGERY — LEFT HEART CATH AND CORONARY ANGIOGRAPHY
Anesthesia: LOCAL

## 2019-08-11 MED ORDER — FENTANYL CITRATE (PF) 100 MCG/2ML IJ SOLN
INTRAMUSCULAR | Status: AC
  Filled 2019-08-11: qty 2

## 2019-08-11 MED ORDER — FAMOTIDINE IN NACL 20-0.9 MG/50ML-% IV SOLN
INTRAVENOUS | Status: AC | PRN
  Administered 2019-08-11: 20 mg via INTRAVENOUS

## 2019-08-11 MED ORDER — ASPIRIN 81 MG PO CHEW: 81.0000 mg | CHEWABLE_TABLET | Freq: Every day | ORAL | Status: DC

## 2019-08-11 MED ORDER — CLOPIDOGREL BISULFATE 75 MG PO TABS: 75.0000 mg | ORAL_TABLET | Freq: Every day | ORAL | Status: DC

## 2019-08-11 MED ORDER — SODIUM CHLORIDE 0.9% FLUSH: 3.0000 mL | INTRAVENOUS | Status: DC | PRN

## 2019-08-11 MED ORDER — LIDOCAINE HCL (PF) 1 % IJ SOLN
INTRAMUSCULAR | Status: DC | PRN
  Administered 2019-08-11: 2 mL via INTRADERMAL

## 2019-08-11 MED ORDER — SODIUM CHLORIDE 0.9 % WEIGHT BASED INFUSION: 1.0000 mL/kg/h | INTRAVENOUS | Status: DC

## 2019-08-11 MED ORDER — NITROGLYCERIN 0.4 MG SL SUBL: 0.4000 mg | SUBLINGUAL_TABLET | SUBLINGUAL | Status: DC | PRN

## 2019-08-11 MED ORDER — ASPIRIN EC 81 MG PO TBEC: 81.0000 mg | DELAYED_RELEASE_TABLET | Freq: Every day | ORAL | Status: DC

## 2019-08-11 MED ORDER — HEPARIN SODIUM (PORCINE) 1000 UNIT/ML IJ SOLN
INTRAMUSCULAR | Status: AC
  Filled 2019-08-11: qty 1

## 2019-08-11 MED ORDER — HEPARIN (PORCINE) IN NACL 1000-0.9 UT/500ML-% IV SOLN
INTRAVENOUS | Status: AC
  Filled 2019-08-11: qty 1000

## 2019-08-11 MED ORDER — METOPROLOL TARTRATE 5 MG/5ML IV SOLN: INTRAVENOUS | Status: DC | PRN

## 2019-08-11 MED ORDER — ACETAMINOPHEN 325 MG PO TABS: 650.0000 mg | ORAL_TABLET | ORAL | Status: DC | PRN

## 2019-08-11 MED ORDER — MIDAZOLAM HCL 2 MG/2ML IJ SOLN
INTRAMUSCULAR | Status: AC
  Filled 2019-08-11: qty 2

## 2019-08-11 MED ORDER — VIPERSLIDE LUBRICANT OPTIME
TOPICAL | Status: DC | PRN
  Administered 2019-08-11: 09:00:00 via SURGICAL_CAVITY

## 2019-08-11 MED ORDER — LABETALOL HCL 5 MG/ML IV SOLN: 10.0000 mg | INTRAVENOUS | Status: AC | PRN

## 2019-08-11 MED ORDER — HEPARIN SODIUM (PORCINE) 1000 UNIT/ML IJ SOLN
INTRAMUSCULAR | Status: DC | PRN
  Administered 2019-08-11: 4000 [IU] via INTRAVENOUS
  Administered 2019-08-11: 2000 [IU] via INTRAVENOUS
  Administered 2019-08-11 (×2): 5000 [IU] via INTRAVENOUS

## 2019-08-11 MED ORDER — HYDRALAZINE HCL 20 MG/ML IJ SOLN
INTRAMUSCULAR | Status: AC
  Filled 2019-08-11: qty 1

## 2019-08-11 MED ORDER — HYDRALAZINE HCL 20 MG/ML IJ SOLN
INTRAMUSCULAR | Status: DC | PRN
  Administered 2019-08-11: 10 mg via INTRAVENOUS

## 2019-08-11 MED ORDER — SODIUM CHLORIDE 0.9 % IV SOLN: 250.0000 mL | INTRAVENOUS | Status: DC | PRN

## 2019-08-11 MED ORDER — ROSUVASTATIN CALCIUM 5 MG PO TABS: 10.0000 mg | ORAL_TABLET | Freq: Every day | ORAL | Status: DC

## 2019-08-11 MED ORDER — FAMOTIDINE IN NACL 20-0.9 MG/50ML-% IV SOLN
INTRAVENOUS | Status: AC
  Filled 2019-08-11: qty 50

## 2019-08-11 MED ORDER — SODIUM CHLORIDE 0.9 % WEIGHT BASED INFUSION
3.0000 mL/kg/h | INTRAVENOUS | Status: AC
  Administered 2019-08-11: 3 mL/kg/h via INTRAVENOUS

## 2019-08-11 MED ORDER — VERAPAMIL HCL 2.5 MG/ML IV SOLN
INTRAVENOUS | Status: AC
  Filled 2019-08-11: qty 2

## 2019-08-11 MED ORDER — NITROGLYCERIN 1 MG/10 ML FOR IR/CATH LAB
INTRA_ARTERIAL | Status: AC
  Filled 2019-08-11: qty 10

## 2019-08-11 MED ORDER — CLOPIDOGREL BISULFATE 300 MG PO TABS
ORAL_TABLET | ORAL | Status: AC
  Filled 2019-08-11: qty 2

## 2019-08-11 MED ORDER — ASPIRIN 81 MG PO CHEW: 81.0000 mg | CHEWABLE_TABLET | ORAL | Status: DC

## 2019-08-11 MED ORDER — HEPARIN (PORCINE) IN NACL 1000-0.9 UT/500ML-% IV SOLN
INTRAVENOUS | Status: DC | PRN
  Administered 2019-08-11 (×2): 500 mL

## 2019-08-11 MED ORDER — MIDAZOLAM HCL 2 MG/2ML IJ SOLN
INTRAMUSCULAR | Status: DC | PRN
  Administered 2019-08-11: 2 mg via INTRAVENOUS
  Administered 2019-08-11 (×3): 1 mg via INTRAVENOUS

## 2019-08-11 MED ORDER — NITROGLYCERIN 1 MG/10 ML FOR IR/CATH LAB
INTRA_ARTERIAL | Status: DC | PRN
  Administered 2019-08-11: 200 ug via INTRACORONARY

## 2019-08-11 MED ORDER — CLOPIDOGREL BISULFATE 300 MG PO TABS
ORAL_TABLET | ORAL | Status: DC | PRN
  Administered 2019-08-11: 600 mg via ORAL

## 2019-08-11 MED ORDER — CLOPIDOGREL BISULFATE 75 MG PO TABS: 75.0000 mg | ORAL_TABLET | Freq: Every day | ORAL | 3 refills | Status: DC

## 2019-08-11 MED ORDER — PANTOPRAZOLE SODIUM 40 MG PO TBEC: 40.0000 mg | DELAYED_RELEASE_TABLET | Freq: Every day | ORAL | 1 refills | Status: DC

## 2019-08-11 MED ORDER — PANTOPRAZOLE SODIUM 40 MG PO TBEC
40.0000 mg | DELAYED_RELEASE_TABLET | Freq: Every day | ORAL | Status: DC
  Filled 2019-08-11: qty 1

## 2019-08-11 MED ORDER — HYDRALAZINE HCL 20 MG/ML IJ SOLN: 10.0000 mg | INTRAMUSCULAR | Status: AC | PRN

## 2019-08-11 MED ORDER — VERAPAMIL HCL 2.5 MG/ML IV SOLN
INTRAVENOUS | Status: DC | PRN
  Administered 2019-08-11: 10 mL via INTRA_ARTERIAL

## 2019-08-11 MED ORDER — ONDANSETRON HCL 4 MG/2ML IJ SOLN: 4.0000 mg | Freq: Four times a day (QID) | INTRAMUSCULAR | Status: DC | PRN

## 2019-08-11 MED ORDER — SODIUM CHLORIDE 0.9 % IV SOLN: INTRAVENOUS | Status: AC

## 2019-08-11 MED ORDER — LIDOCAINE HCL (PF) 1 % IJ SOLN
INTRAMUSCULAR | Status: AC
  Filled 2019-08-11: qty 30

## 2019-08-11 MED ORDER — METOPROLOL SUCCINATE ER 50 MG PO TB24: 50.0000 mg | ORAL_TABLET | Freq: Every day | ORAL | Status: DC

## 2019-08-11 MED ORDER — FENTANYL CITRATE (PF) 100 MCG/2ML IJ SOLN
INTRAMUSCULAR | Status: DC | PRN
  Administered 2019-08-11 (×4): 25 ug via INTRAVENOUS

## 2019-08-11 MED ORDER — SODIUM CHLORIDE 0.9% FLUSH: 3.0000 mL | Freq: Two times a day (BID) | INTRAVENOUS | Status: DC

## 2019-08-11 MED FILL — CLOPIDOGREL 75 MG TABLET: 75 | 90 days supply | Qty: 90 | Fill #0

## 2019-08-11 MED FILL — PANTOPRAZOLE SOD DR 40 MG T: 40 | 90 days supply | Qty: 90 | Fill #0

## 2019-08-11 SURGICAL SUPPLY — 23 items
BALLN SAPPHIRE 2.75X15 (BALLOONS) ×2
BALLN SAPPHIRE ~~LOC~~ 3.5X12 (BALLOONS) ×2 IMPLANT
BALLOON SAPPHIRE 2.75X15 (BALLOONS) ×1 IMPLANT
CATH 5FR JL3.5 JR4 ANG PIG MP (CATHETERS) ×2 IMPLANT
CATH INFINITI 5FR AL1 (CATHETERS) ×2 IMPLANT
CATH LAUNCHER 6FR EBU3.5 (CATHETERS) ×2 IMPLANT
CATH OPTICROSS 40MHZ (CATHETERS) ×2 IMPLANT
CROWN DIAMONDBACK CLASSIC 1.25 (BURR) ×2 IMPLANT
DEVICE RAD COMP TR BAND LRG (VASCULAR PRODUCTS) ×2 IMPLANT
GLIDESHEATH SLEND SS 6F .021 (SHEATH) ×2 IMPLANT
GUIDEWIRE INQWIRE 1.5J.035X260 (WIRE) ×1 IMPLANT
INQWIRE 1.5J .035X260CM (WIRE) ×2
KIT ENCORE 26 ADVANTAGE (KITS) ×2 IMPLANT
KIT HEART LEFT (KITS) ×2 IMPLANT
KIT HEMO VALVE WATCHDOG (MISCELLANEOUS) ×2 IMPLANT
LUBRICANT VIPERSLIDE CORONARY (MISCELLANEOUS) ×1 IMPLANT
PACK CARDIAC CATHETERIZATION (CUSTOM PROCEDURE TRAY) ×2 IMPLANT
SLED PULL BACK IVUS (MISCELLANEOUS) ×2 IMPLANT
STENT SYNERGY DES 3X20 (Permanent Stent) ×1 IMPLANT
TRANSDUCER W/STOPCOCK (MISCELLANEOUS) ×2 IMPLANT
TUBING CIL FLEX 10 FLL-RA (TUBING) ×2 IMPLANT
WIRE ASAHI PROWATER 180CM (WIRE) ×2 IMPLANT
WIRE VIPERWIRE COR FLEX .012 (WIRE) ×2 IMPLANT

## 2019-08-11 NOTE — Progress Notes (Signed)
[id number] Discussed with pt the importance of plavix with stent. Reviewed NTG use, heart healthy food choices and walking for ex. Discussed CRP 2 and referred to [facility] program. Pt considering APP.  Pt is interested in participating in Virtual Cardiac and Pulmonary Rehab. Pt advised that Virtual Cardiac and Pulmonary Rehab is provided at no cost to the patient.  Checklist:  1. Pt has smart device  ie smartphone and/or ipad for downloading an app  Yes 2. Reliable internet/wifi service    Yes 3. Understands how to use their smartphone and navigate within an app.  Yes   Pt verbalized understanding and is in agreement.

## 2019-08-11 NOTE — Progress Notes (Signed)
Discharge instructions reviewed with pt and his wife. Arm board applied to right arm

## 2019-08-11 NOTE — Discharge Summary (Addendum)
Discharge Summary    Patient ID: Henry [name] MRN: [id number]; DOB: [birth year]  Admit date: 08/11/2019 Discharge date: 08/11/2019  Primary Care Provider: [facility]  Primary Cardiologist: Candee Furbish, MD  Primary Electrophysiologist:  None   Discharge Diagnoses    Principal Problem:   CAD (coronary artery disease), native coronary artery Active Problems:   GERD   Angina pectoris ([place])   Status post insertion of drug-eluting stent into left anterior descending (LAD) artery for coronary artery disease    Diagnostic Studies/Procedures    Left heart cath 08/11/19:  Mid LM to Dist LM lesion is 40% stenosed. Cross sectional area well over 6 mm2, and thus not hemodynamically significant.  Prox Cx lesion is 75% stenosed.  Prox LAD lesion is 25% stenosed.  Mid LAD lesion is 90% stenosed. Heavily calcified by IVUS  A drug-eluting stent was successfully placed using a STENT SYNERGY DES 3X20, after orbital atherectomy.  Post intervention, there is a 0% residual stenosis.  2nd Diag lesion is 50% stenosed.  The left ventricular systolic function is normal. LVEDP 18 mm Hg.  LV end diastolic pressure is normal.  The left ventricular ejection fraction is 55-65% by visual estimate.  There is no aortic valve stenosis.   Continue aggressive secondary prevention.    Continue dual antiplatelet therapy.  Will plan for PCI of the circumflex at a later time.   Case discussed with Dr. Marlou Porch.   _____________  History of Present Illness     Henry [name] is a 68 y.o. male presented to clinic 08/08/19 for the evaluation of dyspnea at the request of Dr. Roxan Hockey and [name], NP with pulmonary.  In review of a telephone note from 07/04/2019 he was previously seen by cardiothoracic surgery as well as pulmonary practice and cardiothoracic surgery wanted him to have further evaluation by cardiology for continued ongoing dyspnea.  He has had stable pulmonary  function testing.  He has had ongoing dyspnea with exertion as well as exertional chest pain concerning for potential angina.  He has been followed by pulmonary for a lung mass with potential for possible surgical intervention however the mass seems to be decreasing in size and they will continue to follow this with CT.  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  Chest pain was previously described by Dr. Roxan Hockey as pain that occurred when he first exerted himself but then resolved after 5 to 10 minutes of rest.  He will work through the pain and it will eventually go away and he is able to complete about a 2 mile cycle.  Decreased energy noted. He questions if this is GERD but he thinks it is much worse. Noted on stairs as well. Mild SOB with this. No radiation. No syncope. Hemmoroids, sometines bleed.   Not smoking, quit 35 years ago.   Pain in legs at night has to move them.   Coronary atherosclerosis was noted on CT scan as well as aortic atherosclerosis.  4.1 cm ascending aortic dilatation noted.  CT scan personally reviewed shows dense coronary artery calcification in the LAD distribution.  Hospital Course     Consultants: none  Coronary calcification in LAD distribution Exertional chest pain relieved with rest CAD s/p DES to distal LAD Decision was made to obtain definitive angiography. He presented to [facility] 08/11/19 for left heart cath, which revealed 40% stenosis in the left main not hemodynamically significant. He had disease in the LAD consisting of 40% proximal LAD, 25% mid LAD, and  90% distal LAD. The distal lesion was successfully treated with DES. He also had 75% stenosis in the mid Cx, which may be treated at a later time.  He tolerated the procedure well and recovered in short stay. He will be discharged on ASA and plavix x 12 months. Continue BB and statin. He has prescription for SL nitro.   Hyperlipidemia Continue statin. LDL goal < 70. Will need fasting lipids. He is only on 10 mg  crestor. Will need to titrate this to at least 20 mg OP setting.   Hypertension Medications as above.    Did the patient have an acute coronary syndrome (MI, NSTEMI, STEMI, etc) this admission?:  No.     _____________  Discharge Vitals Blood pressure (!) 148/70, pulse 63, temperature (!) 97.3 F (36.3 C), temperature source Skin, resp. rate 16, height 5\' 10"  (1.778 m), weight 95.3 kg, SpO2 99 %.  Filed Weights   08/11/19 0553  Weight: 95.3 kg    Labs & Radiologic Studies    CBC No results for input(s): WBC, NEUTROABS, HGB, HCT, MCV, PLT in the last 72 hours. Basic Metabolic Panel No results for input(s): NA, K, CL, CO2, GLUCOSE, BUN, CREATININE, CALCIUM, MG, PHOS in the last 72 hours. Liver Function Tests No results for input(s): AST, ALT, ALKPHOS, BILITOT, PROT, ALBUMIN in the last 72 hours. No results for input(s): LIPASE, AMYLASE in the last 72 hours. High Sensitivity Troponin:   No results for input(s): TROPONINIHS in the last 720 hours.  BNP Invalid input(s): POCBNP D-Dimer No results for input(s): DDIMER in the last 72 hours. Hemoglobin A1C No results for input(s): HGBA1C in the last 72 hours. Fasting Lipid Panel No results for input(s): CHOL, HDL, LDLCALC, TRIG, CHOLHDL, LDLDIRECT in the last 72 hours. Thyroid Function Tests No results for input(s): TSH, T4TOTAL, T3FREE, THYROIDAB in the last 72 hours.  Invalid input(s): FREET3 _____________  No results found. Disposition   Pt is being discharged home today in good condition.  Follow-up Plans & Appointments    Follow-up Information    Burtis Junes, NP Follow up on 08/21/2019.   Specialties: Nurse Practitioner, Interventional Cardiology, Cardiology, Radiology Why: 2:00 pm for TOC appt Contact information: [place]. [address] [place] [address] [phone]          Discharge Instructions    Amb Referral to Cardiac Rehabilitation   Complete by: As directed    Diagnosis: Coronary  Stents   After initial evaluation and assessments completed: Virtual Based Care may be provided alone or in conjunction with Phase 2 Cardiac Rehab based on patient barriers.: Yes   Diet - low sodium heart healthy   Complete by: As directed    Discharge instructions   Complete by: As directed    No driving for 1 week. No lifting over 5 lbs for 1 week. No sexual activity for 1 week. You may return to work on 1 week. Keep procedure site clean & dry. If you notice increased pain, swelling, bleeding or pus, call/return!  You may shower, but no soaking baths/hot tubs/pools for 1 week.   Increase activity slowly   Complete by: As directed       Discharge Medications   Allergies as of 08/11/2019   No Known Allergies     Medication List    STOP taking these medications   omeprazole 40 MG capsule Commonly known as: PRILOSEC     TAKE these medications   aspirin EC 81 MG tablet Take  1 tablet (81 mg total) by mouth daily.   clopidogrel 75 MG tablet Commonly known as: PLAVIX Take 1 tablet (75 mg total) by mouth daily with breakfast. Start taking on: August 12, 2019   metoprolol succinate 50 MG 24 hr tablet Commonly known as: TOPROL-XL Take 50 mg by mouth daily.   nitroGLYCERIN 0.4 MG SL tablet Commonly known as: NITROSTAT Place 1 tablet (0.4 mg total) under the tongue every 5 (five) minutes as needed for chest pain.   pantoprazole 40 MG tablet Commonly known as: PROTONIX Take 1 tablet (40 mg total) by mouth daily before breakfast. Start taking on: August 12, 2019   rosuvastatin 10 MG tablet Commonly known as: CRESTOR Take 1 tablet (10 mg total) by mouth daily.   vitamin C 1000 MG tablet Take 1,000 mg by mouth daily.   Vitamin D3 50 MCG (2000 UT) capsule Take 2,000 mg by mouth daily.          Outstanding Labs/Studies   Fasting lipids, titrate statin  Duration of Discharge Encounter   Greater than 30 minutes including physician time.  Signed, [place], PA 08/11/2019, 12:52 PM   I have examined the patient and reviewed assessment and plan and discussed with patient.  Agree with above as stated.  S/p orbital atherectomy of the LAD with stenting.  Plan to stent circumflex in a few weeks. Continue aggressive medical therapy.  He had some LV dysfunction which should improve with revascularization.  F/u with Dr. Marlou Porch.   Larae Grooms

## 2019-08-11 NOTE — Interval H&P Note (Signed)
Cath Lab Visit (complete for each Cath Lab visit)  Clinical Evaluation Leading to the Procedure:   ACS: No.  Non-ACS:    Anginal Classification: CCS III  Anti-ischemic medical therapy: Minimal Therapy (1 class of medications)  Non-Invasive Test Results: No non-invasive testing performed  Prior CABG: No previous CABG      History and Physical Interval Note:  08/11/2019 7:31 AM  Henry [name]  has presented today for surgery, with the diagnosis of angina.  The various methods of treatment have been discussed with the patient and family. After consideration of risks, benefits and other options for treatment, the patient has consented to  Procedure(s): LEFT HEART CATH AND CORONARY ANGIOGRAPHY (N/A) as a surgical intervention.  The patient's history has been reviewed, patient examined, no change in status, stable for surgery.  I have reviewed the patient's chart and labs.  Questions were answered to the patient's satisfaction.     Larae Grooms

## 2019-08-11 NOTE — Discharge Instructions (Signed)
Drink plenty of fluids °Keep right arm at or above heart level  °Radial Site Care ° °This sheet gives you information about how to care for yourself after your procedure. Your health care provider may also give you more specific instructions. If you have problems or questions, contact your health care provider. °What can I expect after the procedure? °After the procedure, it is common to have: °· Bruising and tenderness at the catheter insertion area. °Follow these instructions at home: °Medicines °· Take over-the-counter and prescription medicines only as told by your health care provider. °Insertion site care °· Follow instructions from your health care provider about how to take care of your insertion site. Make sure you: °? Wash your hands with soap and water before you change your bandage (dressing). If soap and water are not available, use hand sanitizer. °? Change your dressing as told by your health care provider. °? Leave stitches (sutures), skin glue, or adhesive strips in place. These skin closures may need to stay in place for 2 weeks or longer. If adhesive strip edges start to loosen and curl up, you may trim the loose edges. Do not remove adhesive strips completely unless your health care provider tells you to do that. °· Check your insertion site every day for signs of infection. Check for: °? Redness, swelling, or pain. °? Fluid or blood. °? Pus or a bad smell. °? Warmth. °· Do not take baths, swim, or use a hot tub until your health care provider approves. °· You may shower 24-48 hours after the procedure, or as directed by your health care provider. °? Remove the dressing and gently wash the site with plain soap and water. °? Pat the area dry with a clean towel. °? Do not rub the site. That could cause bleeding. °· Do not apply powder or lotion to the site. °Activity ° °· For 24 hours after the procedure, or as directed by your health care provider: °? Do not flex or bend the affected arm. °? Do  not push or pull heavy objects with the affected arm. °? Do not drive yourself home from the hospital or clinic. You may drive 24 hours after the procedure unless your health care provider tells you not to. °? Do not operate machinery or power tools. °· Do not lift anything that is heavier than 10 lb (4.5 kg), or the limit that you are told, until your health care provider says that it is safe. °· Ask your health care provider when it is okay to: °? Return to work or school. °? Resume usual physical activities or sports. °? Resume sexual activity. °General instructions °· If the catheter site starts to bleed, raise your arm and put firm pressure on the site. If the bleeding does not stop, get help right away. This is a medical emergency. °· If you went home on the same day as your procedure, a responsible adult should be with you for the first 24 hours after you arrive home. °· Keep all follow-up visits as told by your health care provider. This is important. °Contact a health care provider if: °· You have a fever. °· You have redness, swelling, or yellow drainage around your insertion site. °Get help right away if: °· You have unusual pain at the radial site. °· The catheter insertion area swells very fast. °· The insertion area is bleeding, and the bleeding does not stop when you hold steady pressure on the area. °· Your arm or hand   becomes pale, cool, tingly, or numb. °These symptoms may represent a serious problem that is an emergency. Do not wait to see if the symptoms will go away. Get medical help right away. Call your local emergency services (911 in the [place] Do not drive yourself to the hospital. °Summary °· After the procedure, it is common to have bruising and tenderness at the site. °· Follow instructions from your health care provider about how to take care of your radial site wound. Check the wound every day for signs of infection. °· Do not lift anything that is heavier than 10 lb (4.5 kg), or the  limit that you are told, until your health care provider says that it is safe. °This information is not intended to replace advice given to you by your health care provider. Make sure you discuss any questions you have with your health care provider. °Document Released: 10/31/2010 Document Revised: 11/03/2017 Document Reviewed: 11/03/2017 °Elsevier Patient Education © 2020 [employer] ° °

## 2019-08-14 ENCOUNTER — Encounter (HOSPITAL_COMMUNITY): Payer: Self-pay | Admitting: Interventional Cardiology

## 2019-08-15 ENCOUNTER — Other Ambulatory Visit: Payer: Self-pay

## 2019-08-15 ENCOUNTER — Ambulatory Visit (HOSPITAL_COMMUNITY): Payer: Medicare Other | Attending: Cardiology

## 2019-08-15 ENCOUNTER — Encounter (INDEPENDENT_AMBULATORY_CARE_PROVIDER_SITE_OTHER): Payer: Self-pay

## 2019-08-15 ENCOUNTER — Telehealth (HOSPITAL_COMMUNITY): Payer: Self-pay

## 2019-08-15 DIAGNOSIS — I2510 Atherosclerotic heart disease of native coronary artery without angina pectoris: Secondary | ICD-10-CM | POA: Diagnosis present

## 2019-08-15 DIAGNOSIS — I2584 Coronary atherosclerosis due to calcified coronary lesion: Secondary | ICD-10-CM | POA: Insufficient documentation

## 2019-08-15 DIAGNOSIS — R0602 Shortness of breath: Secondary | ICD-10-CM | POA: Diagnosis present

## 2019-08-15 DIAGNOSIS — I209 Angina pectoris, unspecified: Secondary | ICD-10-CM | POA: Insufficient documentation

## 2019-08-15 NOTE — Telephone Encounter (Signed)
Pt insurance is active and benefits verified through Medicare A/B. Co-pay $0.00, DED $198.00/$198.00 met, out of pocket $0.00/$0.00 met, co-insurance 20%. No pre-authorization required. Passport, 08/15/2019 @ 10:12AM, YJE#[id number]  2ndary insurance is active and benefits verified through [place]. Co-pay $0.00, DED $0.00/$0.00 met, out of pocket $0.00/$0.00 met, co-insurance 0%. No pre-authorization required. Passport, 08/15/2019 @ 1017AM, FOY#[id number]  Will pass to RN Navigator for review.

## 2019-08-17 ENCOUNTER — Telehealth: Payer: Self-pay | Admitting: Cardiology

## 2019-08-17 NOTE — Telephone Encounter (Signed)
[facility] ECHO COMPLETE WO IMAGING ENHANCING AGENT AND WITH 3D Order# [id number] Reading physician: Jerline Pain, MD Ordering physician: Jerline Pain, MD Study date: 08/15/19  ECHOCARDIOGRAM COMPLETE (Accession [id number]) (Order [id number]) Echocardiography Date: 08/15/2019 Department: [facility] Released By: Howie Ill Authorizing: Jerline Pain, MD  ECHOCARDIOGRAM COMPLETE Order #: [id number] Accession #: [id number] Patient Info  Patient name: Henry [name]  MRN: [id number]  Age: 68 y.o.  Sex: male  Vitals  BP Height Weight BSA (Calculated - sq m)  148/79 5\' 10"  (1.778 m) 210 lb (95.3 kg) 2.17 sq meters  Result Notes for ECHOCARDIOGRAM COMPLETE  Notes recorded by Shellia Cleverly, RN on 08/16/2019 at 5:47 PM EST  Left message to c/b for results  ------   Notes recorded by Jerline Pain, MD on 08/16/2019 at 7:34 AM EST  EF is 35-40%, moderately to severely reduced, correlated with heart cath.  After planned staged intervention of circumflex takes place, we should begin Entresto  Let's make sure he has an appt post circumflex intervention  Candee Furbish, MD       Study Result  Result status: Final result    ECHOCARDIOGRAM REPORT       Patient Name:   Henry [name] Date of Exam: 08/15/2019 Medical Rec #:  [id number]      Height:       70.0 in Accession #:    [id number]     Weight:       210.0 lb Date of Birth:  31-Oct-1950     BSA:          2.13 m Patient Age:    45 years       BP:           140/70 mmHg Patient Gender: M              HR:           61 bpm. Exam Location:  [facility]  Procedure: 2D Echo, 3D Echo, Cardiac Doppler and Color Doppler  Indications:    I25.10 CAD                 R07.9 Chest Pain   History:        Patient has no prior history of Echocardiogram examinations. CAD                 Signs/Symptoms:Shortness of Breath, Dyspnea and Chest Pain Risk                 Factors:Hypertension, Family History of  Coronary Artery Disease                 and Former Smoker. Coronary Stents.   Sonographer:    Deliah Boston RDCS Referring Phys: [place]    1. Left ventricular ejection fraction, by visual estimation, is 35 to 40%. The left ventricle has moderate to severely decreased function. There is no left ventricular hypertrophy.  2. Left ventricular diastolic parameters are consistent with Grade I diastolic dysfunction (impaired relaxation).  3. Mildly dilated left ventricular internal cavity size.  4. The left ventricle demonstrates global hypokinesis.  5. Global right ventricle has normal systolic function.The right ventricular size is normal. No increase in right ventricular wall thickness.  6. Left atrial size was severely dilated.  7. Right atrial size was normal.  8. The mitral valve is normal in structure. No evidence of mitral valve regurgitation. No  evidence of mitral stenosis.  9. The tricuspid valve is normal in structure. Tricuspid valve regurgitation is not demonstrated. 10. Aortic valve regurgitation is moderate. 11. The aortic valve is normal in structure. Aortic valve regurgitation is moderate. No evidence of aortic valve sclerosis or stenosis. 12. The pulmonic valve was normal in structure. Pulmonic valve regurgitation is not visualized. 13. 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 14. Normal pulmonary artery systolic pressure. 15. The inferior vena cava is normal in size with greater than 50% respiratory variability, suggesting right atrial pressure of 3 mmHg.  FINDINGS  Left Ventricle: Left ventricular ejection fraction, by visual estimation, is 35 to 40%. The left ventricle has moderate to severely decreased function. The left ventricle demonstrates global hypokinesis. The left ventricular internal cavity size was  mildly dilated left ventricle. There is no left ventricular hypertrophy. Left ventricular diastolic  parameters are consistent with Grade I diastolic dysfunction (impaired relaxation). Normal left atrial pressure.  Right Ventricle: The right ventricular size is normal. No increase in right ventricular wall thickness. Global RV systolic function is has normal systolic function. The tricuspid regurgitant velocity is 2.06 m/s, and with an assumed right atrial pressure  of 8 mmHg, the estimated right ventricular systolic pressure is normal at 25.1 mmHg.  Left Atrium: Left atrial size was severely dilated.  Right Atrium: Right atrial size was normal in size  Pericardium: There is no evidence of pericardial effusion.  Mitral Valve: The mitral valve is normal in structure. No evidence of mitral valve stenosis by observation. No evidence of mitral valve regurgitation.  Tricuspid Valve: The tricuspid valve is normal in structure. Tricuspid valve regurgitation is not demonstrated.  Aortic Valve: The aortic valve is normal in structure. Aortic valve regurgitation is moderate. Aortic regurgitation PHT measures 334 msec. The aortic valve is structurally normal, with no evidence of sclerosis or stenosis.  Pulmonic Valve: The pulmonic valve was normal in structure. Pulmonic valve regurgitation is not visualized.  Aorta: The aortic root, ascending aorta and aortic arch are all structurally normal, with no evidence of dilitation or obstruction. 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  Venous: The inferior vena cava is normal in size with greater than 50% respiratory variability, suggesting right atrial pressure of 3 mmHg.  IAS/Shunts: No atrial level shunt detected by color flow Doppler. No ventricular septal defect is seen or detected. There is no evidence of an atrial septal defect.     LEFT VENTRICLE PLAX 2D LVIDd:         6.50 cm  Diastology LVIDs:         4.92 cm  LV e' lateral:   6.96 cm/s LV PW:         1.11 cm  LV E/e' lateral: 5.5 LV IVS:        0.95  cm  LV e' medial:    3.59 cm/s LVOT diam:     2.80 cm  LV E/e' medial:  10.7 LV SV:         102 ml LV SV Index:   46.55 LVOT Area:     6.16 cm    RIGHT VENTRICLE RV S prime:     12.10 cm/s TAPSE (M-mode): 1.8 cm  LEFT ATRIUM            Index       RIGHT ATRIUM           Index LA diam:      4.50  cm  2.11 cm/m  RA Area:     15.10 cm LA Vol (A2C): 112.0 ml 52.56 ml/m RA Volume:   34.90 ml  16.38 ml/m LA Vol (A4C): 38.9 ml  18.25 ml/m  AORTIC VALVE LVOT Vmax:   114.00 cm/s LVOT Vmean:  67.700 cm/s LVOT VTI:    0.223 m AI PHT:      334 msec   AORTA Ao Root diam: 4.05 cm Ao Asc diam:  4.40 cm  MITRAL VALVE                        TRICUSPID VALVE MV Area (PHT): cm                  TR Peak grad:   17.1 mmHg MV PHT:        msec                 TR Vmax:        211.00 cm/s MV Decel Time: 306 msec MV E velocity: 38.30 cm/s 103 cm/s  SHUNTS MV A velocity: 64.00 cm/s 70.3 cm/s Systemic VTI:  0.22 m MV E/A ratio:  0.60       1.5       Systemic Diam: 2.80 cm    Candee Furbish MD Electronically signed by Candee Furbish MD Signature Date/Time: 08/15/2019/2:10:11 PM       Final    [employer] Images  Show images for ECHOCARDIOGRAM COMPLETE  Images on [employer]  Show images for Sharene Butters  Performing Technologist/Nurse  Performing Technologist/Nurse: Deliah Boston D  Reason for Exam Priority: Routine Dx: Coronary artery calcification [I25.10, I25.84 (ICD-10-CM)]; Angina pectoris ([place]) [I20.9 (ICD-10-CM)]; Shortness of breath [R06.02 (ICD-10-CM)]  Comments:                        Surgical History  Surgical History  No past medical history on file.    Other Surgical History  Procedure Laterality Date Comment Source  CORONARY ATHERECTOMY N/A 08/11/2019 Procedure: CORONARY ATHERECTOMY; Surgeon: Jettie Booze, MD; Location: [facility]; Service: Cardiovascular; Laterality: N/A; Provider  CYSTO/ LEFT URETEROSCOPIC STONE EXTRACTION/  BLADDER STONE EXTRACTION  02-15-2009  Provider  CYSTOSCOPY WITH LITHOLAPAXY N/A 09/29/2013 Procedure: CYSTOSCOPY WITH LITHOLAPAXY; Surgeon: Franchot Gallo, MD; Location: [facility]; Service: Urology; Laterality: N/A; Provider  INTRAVASCULAR ULTRASOUND/IVUS N/A 08/11/2019 Procedure: Intravascular Ultrasound/IVUS; Surgeon: Jettie Booze, MD; Location: [facility]; Service: Cardiovascular; Laterality: N/A; Provider  LEFT HEART CATH AND CORONARY ANGIOGRAPHY N/A 08/11/2019 Procedure: LEFT HEART CATH AND CORONARY ANGIOGRAPHY; Surgeon: Jettie Booze, MD; Location: [facility]; Service: Cardiovascular; Laterality: N/A; Provider  LUMBAR [place] SURGERY  09-19-2000 LEFT L4 -- L5 Provider  REPAIR RIGHT HAND SOFT TISSUE STRUCTURES  02-24-2000 WORK INJURY / PARTIAL AMPUTATION RIGHT INDEX FINGER Provider  TRANSURETHRAL RESECTION OF PROSTATE N/A 09/29/2013 Procedure: TRANSURETHRAL RESECTION OF THE PROSTATE WITH GYRUS INSTRUMENTS; Surgeon: Franchot Gallo, MD; Location: [facility]; Service: Urology; Laterality: N/A; Provider  VIDEO BRONCHOSCOPY WITH ENDOBRONCHIAL ULTRASOUND N/A 05/16/2019 Procedure: VIDEO BRONCHOSCOPY WITH ENDOBRONCHIAL ULTRASOUND; Surgeon: Garner Nash, DO; Location: [facility]; Service: Thoracic; Laterality: N/A; Provider    Implants   Permanent Stent  Stent Synergy Des 3x20 [id number] - Implanted Inventory item: STENT SYNERGY DES 3X20 Model/Cat number: [id number]  Manufacturer: Wilkie Aye Lot number: [id number]  Device identifier: [id number] Device identifier type: GS1  As of 08/11/2019 Status: Implanted  Order-Level Documents - 08/15/2019:  Scan on 08/15/2019 2:11 PM by Default, Provider, MD     Encounter-Level Documents - 08/15/2019:  Electronic signature on 08/15/2019 10:30 AM - E-signed Electronic signature on 08/15/2019 10:30 AM - Princess Perna     Resulted by:  Signed Date/Time  Phone  Pager  [place], [place] C 08/15/2019 2:10 PM [phone]   External Result Report  External Result Report

## 2019-08-17 NOTE — Telephone Encounter (Signed)
New message:  Patient was returning a call from our office to discuss his results from his Echo 08/15/19.

## 2019-08-17 NOTE — Telephone Encounter (Signed)
Pt aware of echo results and pt has f/u appt with Truitt Merle NP on 08/21/19 at 2:00 pm to discuss scheduling a cath and afterwards pt may start taking Entresto.

## 2019-08-18 ENCOUNTER — Inpatient Hospital Stay: Admission: RE | Admit: 2019-08-18 | Payer: Medicare Other | Source: Ambulatory Visit

## 2019-08-18 NOTE — Telephone Encounter (Signed)
Called and spoke with pt in regards to CR, pt stated he was not interested at this time.  Closed referral

## 2019-08-19 NOTE — Progress Notes (Signed)
Cardiology Office Note:    Date:  08/21/2019   ID:  Henry [name], DOB 1951/08/12, MRN [id number]  PCP:  [facility]  Cardiologist:  Candee Furbish, MD  Electrophysiologist:  None   Referring MD: [facility]*   Chief Complaint: follow-up of recent cardiac catheterization  History of Present Illness:    Henry [name] is a 68 y.o. male with a history of CAD s/p recent DES to mid LAD, hypertension, GERD, and BPH who is followed by Dr. Marlou Porch and presents today for follow-up of recent cardiac catheterization.  Patient first seen by Dr. Marlou Porch on 08/08/2019 for evaluation of dyspnea at the request of Dr. Roxan Hockey and [name], NP, with Pulmonology. He has been followed by Pulmonology for a right hilar mass with potential for possible surgical intervention.  However, mass as been decreasing in size and is now being followed with CT scans. More recently patient report chest pain with exertion that resoled with rest as well as associated dyspnea and decreased energy. Recent chest CT showed dense coronary artery calcifications in the LAD distribution as well as aortic atherosclerosis. Therefore, outpatient cardiac catheterization was ordered. Cath was performed on 08/11/2019 and showed heavily calcified 90% stenosis of mid LAD as well as 40% stenosis of the mid to distal left main, 25% of the proximal LAD, 75% stenosis of the proximal CX, and 50% stenosis of the 2nd Diag. LVEF was 35-45% but LVEDP was normal. Patient underwent successful orbital atherectomy followed by DES to mid LAD lesion with plans for PCI of CX at a later time. Patient started on dual-antiplatelet therapy with Aspirin 5m daily and Plavix 735mdaily. Echo on 08/15/2019 showed LVEF of 35-40% with global hypokinesis and grade 1 diastolic dysfunction.   Patient presents today for follow-up. Patient reports one episode of substernal burning chest pain yesterday while walking that he states lasted about 5  minutes. Did no require Nitro. He states it is similar to the pain that he has had for years but not as severe. He also notes chronic dyspnea with activity but no significant worsening. He notes some possible orthopnea and PND. He states he snores but has never been tested for sleep apnea. No lower extremity edema, palpitations, lightheadedness, or dizziness. I discussed results of cath and Echo with patient. 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 He states he cannot due staged PCI of left circumflex for 1.5-2 weeks because he has to get his current job done.   Of note, patient reported some right forearm pain for a few days following cath as well as some redness around cath site. He states he almost went to the ED for this. However, it  has now resolved and he only has minimal soreness now.   Past Medical History:  Diagnosis Date  . Anemia    low iron  . BPH (benign prostatic hypertrophy)   . CAD (coronary artery disease), native coronary artery    a. LHC 07/2019: 90% mLAD s/p atherectomyDES, 75% pLCX, 40% LM, 25% pLAD, 50% 2nd Diag. LVEF 35-45. Normal LVEDP  . Dyspnea    "a little bit" with exertion  . Frequency of urination   . GERD (gastroesophageal reflux disease)    occasionally uses tums or 1 tbsp vinager  . History of bladder stone   . History of kidney stones   . Hypertension   . Ischemic cardiomyopathy     a. Echo 08/2019: LVEF 35-40%, global hypokinesis, G1DD  .  Nocturia   . Pneumonia   . Status post insertion of drug-eluting stent into left anterior descending (LAD) artery for coronary artery disease 08/11/2019  . Wears glasses     Past Surgical History:  Procedure Laterality Date  . CORONARY ATHERECTOMY N/A 08/11/2019   Procedure: CORONARY ATHERECTOMY;  Surgeon: Jettie Booze, MD;  Location: [place] [facility];  Service: Cardiovascular;  Laterality: N/A;  . CYSTO/  LEFT URETEROSCOPIC STONE EXTRACTION/  BLADDER  STONE EXTRACTION  02-15-2009  .  CYSTOSCOPY WITH LITHOLAPAXY N/A 09/29/2013   Procedure: CYSTOSCOPY WITH LITHOLAPAXY;  Surgeon: Franchot Gallo, MD;  Location: [facility];  Service: Urology;  Laterality: N/A;  . INTRAVASCULAR ULTRASOUND/IVUS N/A 08/11/2019   Procedure: Intravascular Ultrasound/IVUS;  Surgeon: Jettie Booze, MD;  Location: [place] [facility];  Service: Cardiovascular;  Laterality: N/A;  . LEFT HEART CATH AND CORONARY ANGIOGRAPHY N/A 08/11/2019   Procedure: LEFT HEART CATH AND CORONARY ANGIOGRAPHY;  Surgeon: Jettie Booze, MD;  Location: [place] [facility];  Service: Cardiovascular;  Laterality: N/A;  . LUMBAR [place] SURGERY  09-19-2000   LEFT  L4 -- L5  . REPAIR RIGHT HAND SOFT TISSUE STRUCTURES  02-24-2000   WORK INJURY /  PARTIAL AMPUTATION RIGHT INDEX FINGER  . TRANSURETHRAL RESECTION OF PROSTATE N/A 09/29/2013   Procedure: TRANSURETHRAL RESECTION OF THE PROSTATE WITH GYRUS INSTRUMENTS;  Surgeon: Franchot Gallo, MD;  Location: [facility];  Service: Urology;  Laterality: N/A;  . VIDEO BRONCHOSCOPY WITH ENDOBRONCHIAL ULTRASOUND N/A 05/16/2019   Procedure: VIDEO BRONCHOSCOPY WITH ENDOBRONCHIAL ULTRASOUND;  Surgeon: Garner Nash, DO;  Location: [facility];  Service: Thoracic;  Laterality: N/A;    Current Medications: Current Meds  Medication Sig  . Ascorbic Acid (VITAMIN C) 1000 MG tablet Take 1,000 mg by mouth daily.  Marland Kitchen aspirin EC 81 MG tablet Take 1 tablet (81 mg total) by mouth daily.  . Cholecalciferol (VITAMIN D3) 50 MCG (2000 UT) capsule Take 2,000 mg by mouth daily.   . clopidogrel (PLAVIX) 75 MG tablet Take 1 tablet (75 mg total) by mouth daily with breakfast.  . metoprolol succinate (TOPROL-XL) 50 MG 24 hr tablet Take 50 mg by mouth daily.  . nitroGLYCERIN (NITROSTAT) 0.4 MG SL tablet Place 1 tablet (0.4 mg total) under the tongue every 5 (five) minutes as needed for chest pain.  . pantoprazole (PROTONIX) 40 MG tablet Take 1 tablet (40 mg total) by mouth  daily before breakfast.  . rosuvastatin (CRESTOR) 10 MG tablet Take 1 tablet (10 mg total) by mouth daily.     Allergies:   Patient has no known allergies.   Social History   Socioeconomic History  . Marital status: Married    Spouse name: Not on file  . Number of children: Not on file  . Years of education: Not on file  . Highest education level: Not on file  Occupational History  . 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 Financial resource strain: Not on file  . Food insecurity    Worry: Not on file    Inability: Not on file  . Transportation needs    Medical: Not on file    Non-medical: Not on file  Tobacco Use  . Smoking status: Former Smoker    Packs/day: 1.00    Years: 20.00    Pack years: 20.00    Types: Cigarettes    Quit  date: 09/29/1983    Years since quitting: 35.9  . Smokeless tobacco: Never Used  Substance and Sexual Activity  . Alcohol use: Yes    Comment: rare  . Drug use: No  . Sexual activity: Not on file  Lifestyle  . Physical activity    Days per week: Not on file    Minutes per session: Not on file  . Stress: Not on file  Relationships  . Social Herbalist on phone: Not on file    Gets together: Not on file    Attends religious service: Not on file    Active member of club or organization: Not on file    Attends meetings of clubs or organizations: Not on file    Relationship status: Not on file  Other Topics Concern  . Not on file  Social History Narrative  . Not on file     Family History: The patient's family history is not on file.  ROS:   Please see the history of present illness.    All other systems reviewed and are negative.  EKGs/Labs/Other Studies Reviewed:    The following studies were reviewed today:  Left Heart Catheterization 08/11/2019:  Mid LM to Dist LM lesion is 40% stenosed. Cross sectional area well over 6 mm2, and thus not  hemodynamically significant.  Prox Cx lesion is 75% stenosed.  Prox LAD lesion is 25% stenosed.  Mid LAD lesion is 90% stenosed. Heavily calcified by IVUS  A drug-eluting stent was successfully placed using a STENT SYNERGY DES 3X20, after orbital atherectomy.  Post intervention, there is a 0% residual stenosis.  2nd Diag lesion is 50% stenosed.  The left ventricular systolic function is normal. LVEDP 18 mm Hg.  LV end diastolic pressure is normal.  The left ventricular ejection fraction is 35-45% by visual estimate.  There is no aortic valve stenosis.   Continue aggressive secondary prevention.    Continue dual antiplatelet therapy.  Will plan for PCI of the circumflex at a later time.   Case discussed with Dr. Marlou Porch.   _______________  Echocardiogram 08/15/2019: Impressions: 1. Left ventricular ejection fraction, by visual estimation, is 35 to 40%. The left ventricle has moderate to severely decreased function. There is no left ventricular hypertrophy.  2. Left ventricular diastolic parameters are consistent with Grade I diastolic dysfunction (impaired relaxation).  3. Mildly dilated left ventricular internal cavity size.  4. The left ventricle demonstrates global hypokinesis.  5. Global right ventricle has normal systolic function.The right ventricular size is normal. No increase in right ventricular wall thickness.  6. Left atrial size was severely dilated.  7. Right atrial size was normal.  8. The mitral valve is normal in structure. No evidence of mitral valve regurgitation. No evidence of mitral stenosis.  9. The tricuspid valve is normal in structure. Tricuspid valve regurgitation is not demonstrated. 10. Aortic valve regurgitation is moderate. 11. The aortic valve is normal in structure. Aortic valve regurgitation is moderate. No evidence of aortic valve sclerosis or stenosis. 12. The pulmonic valve was normal in structure. Pulmonic valve regurgitation is not  visualized. 13. 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 14. Normal pulmonary artery systolic pressure. 15. The inferior vena cava is normal in size with greater than 50% respiratory variability, suggesting right atrial pressure of 3 mmHg.  EKG:  EKG ordered today. EKG personally reviewed and demonstrates normal sinus rhythm, rate 63 bpm, with LVH and diffuse T wave inversion in  anterolateral leads and lead II. No significant changes compared to tracing in 07/2019.  Recent Labs: 05/08/2019: ALT 17 08/08/2019: BUN 16; Creatinine, Ser 0.87; Hemoglobin 13.3; Platelets 150; Potassium 4.3; Sodium 141  Recent Lipid Panel No results found for: CHOL, TRIG, HDL, CHOLHDL, VLDL, LDLCALC, LDLDIRECT  Physical Exam:    Vital Signs: BP 132/72   Pulse 65   Ht '5\' 10"'$  (1.778 m)   Wt 209 lb 12.8 oz (95.2 kg)   SpO2 99%   BMI 30.10 kg/m     Wt Readings from Last 3 Encounters:  08/21/19 209 lb 12.8 oz (95.2 kg)  08/11/19 210 lb (95.3 kg)  08/08/19 210 lb 12.8 oz (95.6 kg)     General: 68 y.o. Caucasian male  in no acute distress. HEENT: Normocephalic and atraumatic. Sclera clear. EOMs intact. Neck: Supple. No carotid bruits. No JVD. Heart: RRR. Distinct S1 and S2. No murmurs, gallops, or rubs. Radial pulses 2+ and equal bilaterally. Right radial cath site soft with no erythema or hematoma. Lungs: No increased work of breathing. Clear to ausculation bilaterally. No wheezes, rhonchi, or rales.  Abdomen: Soft, non-distended, and non-tender to palpation. Bowel sounds present. MSK: Normal strength and tone for age. Extremities: No lower extremity edema.    Skin: Warm and dry. Neuro: Alert and oriented x3. No focal deficits. Psych: Normal affect. Responds appropriately.   Assessment:    1. Coronary artery disease involving native coronary artery of native heart with angina pectoris ([place])   2. Angina pectoris ([place])   3. Status post angioplasty with stent   4. Essential  hypertension   5. Pre-procedure lab exam   6. Ascending aorta dilatation (H[facility])   7. Possible Sleep Apnea     Plan:    CAD s/p Recent PCI with Angina - Recent cath showed heavily calcified 90% stenosis of LAD. Also noted to have 75% stenosis of CX and mild/moderate disease elsewhere. Underwent orbital atherectomy and DES to mid LAD. - Had episode of exertional chest pain yesterday that resolved within 5 minutes without Nitro. Currently chest pain free. - EKG shows normal sinus rhythm with LVH and diffuse T wave inversion. No significant changes from tracing in 07/2019. - Continue dual antiplatelet therapy with Aspirin and Plavix. Continue Toprol-XL '50mg'$  daily and Crestor '10mg'$  daily. Will wait for lipid panel before increasing statin. - I was ideally hoping to get patient in for staged PCI of circumflex this week. 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 I explained risk for CV event with this strenuous activity and explained by recommendation for staged PCI sooner rather than later. He understands but is adamant that he cannot due it any sooner. Therefore, staged PCI scheduled for 09/04/2019 with Dr. [place]. Will add Imdur '30mg'$  daily. Will come for labs (CBC, CMET, and fasting lipid panel) as well as COVID test on 08/31/2019. Also, instructed patient to keep sublingual Nitro on him while at work and to call us if symptoms worsen.  Ischemic Cardiomyopathy - Recent Echo on 08/15/2019 showed LVEF of 35-40% with diffuse hypokinesis and grade 1 diastolic dysfunction. - Appears euvolemic on exam. - Continue Toprol-XL as above.  - Per Dr. Marlou Porch, plan to start [facility] following staged PCI of CX.  Hypertension - BP relatively well controlled at 132/72. - Continue Toprol-XL as above. - Will likely start Entresto after cath.  Dilated Ascending Aortic - Echo showed moderate dilatation of ascending aorta measuring 44  mm. -  Consider further evaluation with CTA/MRA following staged PCI. Will defer to primary Cardiologist.  Possible Sleep Apnea - BMI 30.10. Patient reports some snoring and possible fatigue during day. - Could consider sleep study after staged PCI.  Disposition: Follow up with Dr. Marlou Porch following staged PCI.   Medication Adjustments/Labs and Tests Ordered: Current medicines are reviewed at length with the patient today.  Concerns regarding medicines are outlined above.  Orders Placed This Encounter  Procedures  . Lipid Profile  . CBC w/Diff  . Comp Met (CMET)  . EKG 12-Lead   Meds ordered this encounter  Medications  . isosorbide mononitrate (IMDUR) 30 MG 24 hr tablet    Sig: Take 1 tablet (30 mg total) by mouth daily.    Dispense:  30 tablet    Refill:  1    Patient Instructions  Medication Instructions:  Your physician has recommended you make the following change in your medication:  1. Start Imdur one tablet (30 mg ) daily, sent in today to requested pharmacy.   *If you need a refill on your cardiac medications before your next appointment, please call your pharmacy*  Lab Work: Your physician recommends that you return for a FASTING lipid profile/cmet/cbc  If you have labs (blood work) drawn today and your tests are completely normal, you will receive your results only by: Marland Kitchen MyChart Message (if you have MyChart) OR . A paper copy in the mail If you have any lab test that is abnormal or we need to change your treatment, we will call you to review the results.  Testing/Procedures:  Your provider has recommended a cardiac catherization.   COVID SCREENING INFORMATION: You are scheduled for your COVID screening on Thursday, November at 10-3. [place] [facility] (old [facility]) [address] Stay in the RIGHT lane and proceed under the brick awning (NOT the tent) and tell them you are there for pre-procedure testing. Do NOT bring any pets with you to  the testing site  You are scheduled for a cardiac catheterization on Monday, November 23 with Dr. Neita Garnet or associate.  Please arrive at the [facility] (Main Entrance) at [facility] at [address] [place] Stay on Monday, November 23 at 5:30 am.  Free valet parking is available.   You are allowed only one visitor in the hospital with you. Both you and your visitor must wear masks.    Special note: Every effort is made to have your procedure done on time.   Please understand that emergencies sometimes delay a scheduled   procedure.  No solid foods after midnight on Sunday, November 22. You may have clear liquids until 5 am on the day of your procedure.  You may take your morning medications with a sip of water on the day of your procedure.  Please take a baby aspirin (81 mg) on the morning of your procedure.   Medications to [place] for a one night stay -- bring personal belongings.  Bring a current list of your medications and current insurance cards.  You MUST have a responsible person to drive you home. Someone MUST be with you the first 24 hours after you arrive home or your discharge will be delayed. Wear clothes that are easy to get on and off and wear slip on shoes.    Coronary Angiogram A coronary angiogram, also called coronary angiography, is an X-ray procedure used to look  at the arteries in the heart. In this procedure, a dye (contrast dye) is injected through a long, hollow tube (catheter). The catheter is about the size of a piece of cooked spaghetti and is inserted through your groin, wrist, or arm. The dye is injected into each artery, and X-rays are then taken to show if there is a blockage in the arteries of your heart.  LET [facility] CARE PROVIDER KNOW ABOUT:  Any allergies you have, including allergies to shellfish or contrast dye.    All medicines you are taking, including vitamins, herbs, eye drops, creams, and  over-the-counter medicines.    Previous problems you or members of your family have had with the use of anesthetics.    Any blood disorders you have.    Previous surgeries you have had.  History of kidney problems or failure.    Other medical conditions you have.  RISKS AND COMPLICATIONS  Generally, a coronary angiogram is a safe procedure. However, about 1 person out of 1000 can have problems that may include:  Allergic reaction to the dye.  Bleeding/bruising from the access site or other locations.  Kidney injury, especially in people with impaired kidney function.   Stroke (rare).  Heart attack (rare).  Irregular rhythms (rare)  Death (rare)  BEFORE THE PROCEDURE   Do not eat or drink anything after midnight the night before the procedure or as directed by your health care provider.    Ask your health care provider about changing or stopping your regular medicines. This is especially important if you are taking diabetes medicines or blood thinners.  PROCEDURE  You may be given a medicine to help you relax (sedative) before the procedure. This medicine is given through an intravenous (IV) access tube that is inserted into one of your veins.    The area where the catheter will be inserted will be washed and shaved. This is usually done in the groin but may be done in the fold of your arm (near your elbow) or in the wrist.     A medicine will be given to numb the area where the catheter will be inserted (local anesthetic).    The health care provider will insert the catheter into an artery. The catheter will be guided by using a special type of X-ray (fluoroscopy) of the blood vessel being examined.    A special dye will then be injected into the catheter, and X-rays will be taken. The dye will help to show where any narrowing or blockages are located in the heart arteries.     AFTER THE PROCEDURE   If the procedure is done through the leg, you will be kept in bed  lying flat for several hours. You will be instructed to not bend or cross your legs.  The insertion site will be checked frequently.    The pulse in your feet or wrist will be checked frequently.    Additional blood tests, X-rays, and an electrocardiogram may be done.       Follow-Up: At [facility], you and your health needs are our priority.  As part of our continuing mission to provide you with exceptional heart care, we have created designated Provider Care Teams.  These Care Teams include your primary Cardiologist (physician) and Advanced Practice Providers (APPs -  Physician Assistants and Nurse Practitioners) who all work together to provide you with the care you need, when you need it.  Your next appointment:   4-6 weeks  The  format for your next appointment:   In Person  Your physician recommends that you keep your scheduled  follow-up appointment with Dr.Skains.   Provider:   Candee Furbish, MD        Signed, Darreld Mclean, PA-C  08/21/2019 5:47 PM    [place]

## 2019-08-19 NOTE — H&P (View-Only) (Signed)
Cardiology Office Note:    Date:  08/21/2019   ID:  Henry [name], DOB 1951/08/12, MRN [id number]  PCP:  [facility]  Cardiologist:  Candee Furbish, MD  Electrophysiologist:  None   Referring MD: [facility]*   Chief Complaint: follow-up of recent cardiac catheterization  History of Present Illness:    Henry [name] is a 68 y.o. male with a history of CAD s/p recent DES to mid LAD, hypertension, GERD, and BPH who is followed by Dr. Marlou Porch and presents today for follow-up of recent cardiac catheterization.  Patient first seen by Dr. Marlou Porch on 08/08/2019 for evaluation of dyspnea at the request of Dr. Roxan Hockey and [name], NP, with Pulmonology. He has been followed by Pulmonology for a right hilar mass with potential for possible surgical intervention.  However, mass as been decreasing in size and is now being followed with CT scans. More recently patient report chest pain with exertion that resoled with rest as well as associated dyspnea and decreased energy. Recent chest CT showed dense coronary artery calcifications in the LAD distribution as well as aortic atherosclerosis. Therefore, outpatient cardiac catheterization was ordered. Cath was performed on 08/11/2019 and showed heavily calcified 90% stenosis of mid LAD as well as 40% stenosis of the mid to distal left main, 25% of the proximal LAD, 75% stenosis of the proximal CX, and 50% stenosis of the 2nd Diag. LVEF was 35-45% but LVEDP was normal. Patient underwent successful orbital atherectomy followed by DES to mid LAD lesion with plans for PCI of CX at a later time. Patient started on dual-antiplatelet therapy with Aspirin 5m daily and Plavix 735mdaily. Echo on 08/15/2019 showed LVEF of 35-40% with global hypokinesis and grade 1 diastolic dysfunction.   Patient presents today for follow-up. Patient reports one episode of substernal burning chest pain yesterday while walking that he states lasted about 5  minutes. Did no require Nitro. He states it is similar to the pain that he has had for years but not as severe. He also notes chronic dyspnea with activity but no significant worsening. He notes some possible orthopnea and PND. He states he snores but has never been tested for sleep apnea. No lower extremity edema, palpitations, lightheadedness, or dizziness. I discussed results of cath and Echo with patient. 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 He states he cannot due staged PCI of left circumflex for 1.5-2 weeks because he has to get his current job done.   Of note, patient reported some right forearm pain for a few days following cath as well as some redness around cath site. He states he almost went to the ED for this. However, it  has now resolved and he only has minimal soreness now.   Past Medical History:  Diagnosis Date  . Anemia    low iron  . BPH (benign prostatic hypertrophy)   . CAD (coronary artery disease), native coronary artery    a. LHC 07/2019: 90% mLAD s/p atherectomyDES, 75% pLCX, 40% LM, 25% pLAD, 50% 2nd Diag. LVEF 35-45. Normal LVEDP  . Dyspnea    "a little bit" with exertion  . Frequency of urination   . GERD (gastroesophageal reflux disease)    occasionally uses tums or 1 tbsp vinager  . History of bladder stone   . History of kidney stones   . Hypertension   . Ischemic cardiomyopathy     a. Echo 08/2019: LVEF 35-40%, global hypokinesis, G1DD  .  Nocturia   . Pneumonia   . Status post insertion of drug-eluting stent into left anterior descending (LAD) artery for coronary artery disease 08/11/2019  . Wears glasses     Past Surgical History:  Procedure Laterality Date  . CORONARY ATHERECTOMY N/A 08/11/2019   Procedure: CORONARY ATHERECTOMY;  Surgeon: Jettie Booze, MD;  Location: [place] [facility];  Service: Cardiovascular;  Laterality: N/A;  . CYSTO/  LEFT URETEROSCOPIC STONE EXTRACTION/  BLADDER  STONE EXTRACTION  02-15-2009  .  CYSTOSCOPY WITH LITHOLAPAXY N/A 09/29/2013   Procedure: CYSTOSCOPY WITH LITHOLAPAXY;  Surgeon: Franchot Gallo, MD;  Location: [facility];  Service: Urology;  Laterality: N/A;  . INTRAVASCULAR ULTRASOUND/IVUS N/A 08/11/2019   Procedure: Intravascular Ultrasound/IVUS;  Surgeon: Jettie Booze, MD;  Location: [place] [facility];  Service: Cardiovascular;  Laterality: N/A;  . LEFT HEART CATH AND CORONARY ANGIOGRAPHY N/A 08/11/2019   Procedure: LEFT HEART CATH AND CORONARY ANGIOGRAPHY;  Surgeon: Jettie Booze, MD;  Location: [place] [facility];  Service: Cardiovascular;  Laterality: N/A;  . LUMBAR [place] SURGERY  09-19-2000   LEFT  L4 -- L5  . REPAIR RIGHT HAND SOFT TISSUE STRUCTURES  02-24-2000   WORK INJURY /  PARTIAL AMPUTATION RIGHT INDEX FINGER  . TRANSURETHRAL RESECTION OF PROSTATE N/A 09/29/2013   Procedure: TRANSURETHRAL RESECTION OF THE PROSTATE WITH GYRUS INSTRUMENTS;  Surgeon: Franchot Gallo, MD;  Location: [facility];  Service: Urology;  Laterality: N/A;  . VIDEO BRONCHOSCOPY WITH ENDOBRONCHIAL ULTRASOUND N/A 05/16/2019   Procedure: VIDEO BRONCHOSCOPY WITH ENDOBRONCHIAL ULTRASOUND;  Surgeon: Garner Nash, DO;  Location: [facility];  Service: Thoracic;  Laterality: N/A;    Current Medications: Current Meds  Medication Sig  . Ascorbic Acid (VITAMIN C) 1000 MG tablet Take 1,000 mg by mouth daily.  Marland Kitchen aspirin EC 81 MG tablet Take 1 tablet (81 mg total) by mouth daily.  . Cholecalciferol (VITAMIN D3) 50 MCG (2000 UT) capsule Take 2,000 mg by mouth daily.   . clopidogrel (PLAVIX) 75 MG tablet Take 1 tablet (75 mg total) by mouth daily with breakfast.  . metoprolol succinate (TOPROL-XL) 50 MG 24 hr tablet Take 50 mg by mouth daily.  . nitroGLYCERIN (NITROSTAT) 0.4 MG SL tablet Place 1 tablet (0.4 mg total) under the tongue every 5 (five) minutes as needed for chest pain.  . pantoprazole (PROTONIX) 40 MG tablet Take 1 tablet (40 mg total) by mouth  daily before breakfast.  . rosuvastatin (CRESTOR) 10 MG tablet Take 1 tablet (10 mg total) by mouth daily.     Allergies:   Patient has no known allergies.   Social History   Socioeconomic History  . Marital status: Married    Spouse name: Not on file  . Number of children: Not on file  . Years of education: Not on file  . Highest education level: Not on file  Occupational History  . 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 Financial resource strain: Not on file  . Food insecurity    Worry: Not on file    Inability: Not on file  . Transportation needs    Medical: Not on file    Non-medical: Not on file  Tobacco Use  . Smoking status: Former Smoker    Packs/day: 1.00    Years: 20.00    Pack years: 20.00    Types: Cigarettes    Quit  date: 09/29/1983    Years since quitting: 35.9  . Smokeless tobacco: Never Used  Substance and Sexual Activity  . Alcohol use: Yes    Comment: rare  . Drug use: No  . Sexual activity: Not on file  Lifestyle  . Physical activity    Days per week: Not on file    Minutes per session: Not on file  . Stress: Not on file  Relationships  . Social Herbalist on phone: Not on file    Gets together: Not on file    Attends religious service: Not on file    Active member of club or organization: Not on file    Attends meetings of clubs or organizations: Not on file    Relationship status: Not on file  Other Topics Concern  . Not on file  Social History Narrative  . Not on file     Family History: The patient's family history is not on file.  ROS:   Please see the history of present illness.    All other systems reviewed and are negative.  EKGs/Labs/Other Studies Reviewed:    The following studies were reviewed today:  Left Heart Catheterization 08/11/2019:  Mid LM to Dist LM lesion is 40% stenosed. Cross sectional area well over 6 mm2, and thus not  hemodynamically significant.  Prox Cx lesion is 75% stenosed.  Prox LAD lesion is 25% stenosed.  Mid LAD lesion is 90% stenosed. Heavily calcified by IVUS  A drug-eluting stent was successfully placed using a STENT SYNERGY DES 3X20, after orbital atherectomy.  Post intervention, there is a 0% residual stenosis.  2nd Diag lesion is 50% stenosed.  The left ventricular systolic function is normal. LVEDP 18 mm Hg.  LV end diastolic pressure is normal.  The left ventricular ejection fraction is 35-45% by visual estimate.  There is no aortic valve stenosis.   Continue aggressive secondary prevention.    Continue dual antiplatelet therapy.  Will plan for PCI of the circumflex at a later time.   Case discussed with Dr. Marlou Porch.   _______________  Echocardiogram 08/15/2019: Impressions: 1. Left ventricular ejection fraction, by visual estimation, is 35 to 40%. The left ventricle has moderate to severely decreased function. There is no left ventricular hypertrophy.  2. Left ventricular diastolic parameters are consistent with Grade I diastolic dysfunction (impaired relaxation).  3. Mildly dilated left ventricular internal cavity size.  4. The left ventricle demonstrates global hypokinesis.  5. Global right ventricle has normal systolic function.The right ventricular size is normal. No increase in right ventricular wall thickness.  6. Left atrial size was severely dilated.  7. Right atrial size was normal.  8. The mitral valve is normal in structure. No evidence of mitral valve regurgitation. No evidence of mitral stenosis.  9. The tricuspid valve is normal in structure. Tricuspid valve regurgitation is not demonstrated. 10. Aortic valve regurgitation is moderate. 11. The aortic valve is normal in structure. Aortic valve regurgitation is moderate. No evidence of aortic valve sclerosis or stenosis. 12. The pulmonic valve was normal in structure. Pulmonic valve regurgitation is not  visualized. 13. 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 14. Normal pulmonary artery systolic pressure. 15. The inferior vena cava is normal in size with greater than 50% respiratory variability, suggesting right atrial pressure of 3 mmHg.  EKG:  EKG ordered today. EKG personally reviewed and demonstrates normal sinus rhythm, rate 63 bpm, with LVH and diffuse T wave inversion in  anterolateral leads and lead II. No significant changes compared to tracing in 07/2019.  Recent Labs: 05/08/2019: ALT 17 08/08/2019: BUN 16; Creatinine, Ser 0.87; Hemoglobin 13.3; Platelets 150; Potassium 4.3; Sodium 141  Recent Lipid Panel No results found for: CHOL, TRIG, HDL, CHOLHDL, VLDL, LDLCALC, LDLDIRECT  Physical Exam:    Vital Signs: BP 132/72   Pulse 65   Ht '5\' 10"'$  (1.778 m)   Wt 209 lb 12.8 oz (95.2 kg)   SpO2 99%   BMI 30.10 kg/m     Wt Readings from Last 3 Encounters:  08/21/19 209 lb 12.8 oz (95.2 kg)  08/11/19 210 lb (95.3 kg)  08/08/19 210 lb 12.8 oz (95.6 kg)     General: 68 y.o. Caucasian male  in no acute distress. HEENT: Normocephalic and atraumatic. Sclera clear. EOMs intact. Neck: Supple. No carotid bruits. No JVD. Heart: RRR. Distinct S1 and S2. No murmurs, gallops, or rubs. Radial pulses 2+ and equal bilaterally. Right radial cath site soft with no erythema or hematoma. Lungs: No increased work of breathing. Clear to ausculation bilaterally. No wheezes, rhonchi, or rales.  Abdomen: Soft, non-distended, and non-tender to palpation. Bowel sounds present. MSK: Normal strength and tone for age. Extremities: No lower extremity edema.    Skin: Warm and dry. Neuro: Alert and oriented x3. No focal deficits. Psych: Normal affect. Responds appropriately.   Assessment:    1. Coronary artery disease involving native coronary artery of native heart with angina pectoris ([place])   2. Angina pectoris ([place])   3. Status post angioplasty with stent   4. Essential  hypertension   5. Pre-procedure lab exam   6. Ascending aorta dilatation ([facility])   7. Possible Sleep Apnea     Plan:    CAD s/p Recent PCI with Angina - Recent cath showed heavily calcified 90% stenosis of LAD. Also noted to have 75% stenosis of CX and mild/moderate disease elsewhere. Underwent orbital atherectomy and DES to mid LAD. - Had episode of exertional chest pain yesterday that resolved within 5 minutes without Nitro. Currently chest pain free. - EKG shows normal sinus rhythm with LVH and diffuse T wave inversion. No significant changes from tracing in 07/2019. - Continue dual antiplatelet therapy with Aspirin and Plavix. Continue Toprol-XL '50mg'$  daily and Crestor '10mg'$  daily. Will wait for lipid panel before increasing statin. - I was ideally hoping to get patient in for staged PCI of circumflex this week. 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 I explained risk for CV event with this strenuous activity and explained by recommendation for staged PCI sooner rather than later. He understands but is adamant that he cannot due it any sooner. Therefore, staged PCI scheduled for 09/04/2019 with Dr. [place]. Will add Imdur '30mg'$  daily. Will come for labs (CBC, CMET, and fasting lipid panel) as well as COVID test on 08/31/2019. Also, instructed patient to keep sublingual Nitro on him while at work and to call us if symptoms worsen.  Ischemic Cardiomyopathy - Recent Echo on 08/15/2019 showed LVEF of 35-40% with diffuse hypokinesis and grade 1 diastolic dysfunction. - Appears euvolemic on exam. - Continue Toprol-XL as above.  - Per Dr. Marlou Porch, plan to start [facility] following staged PCI of CX.  Hypertension - BP relatively well controlled at 132/72. - Continue Toprol-XL as above. - Will likely start Entresto after cath.  Dilated Ascending Aortic - Echo showed moderate dilatation of ascending aorta measuring 44  mm. -  Consider further evaluation with CTA/MRA following staged PCI. Will defer to primary Cardiologist.  Possible Sleep Apnea - BMI 30.10. Patient reports some snoring and possible fatigue during day. - Could consider sleep study after staged PCI.  Disposition: Follow up with Dr. Marlou Porch following staged PCI.   Medication Adjustments/Labs and Tests Ordered: Current medicines are reviewed at length with the patient today.  Concerns regarding medicines are outlined above.  Orders Placed This Encounter  Procedures  . Lipid Profile  . CBC w/Diff  . Comp Met (CMET)  . EKG 12-Lead   Meds ordered this encounter  Medications  . isosorbide mononitrate (IMDUR) 30 MG 24 hr tablet    Sig: Take 1 tablet (30 mg total) by mouth daily.    Dispense:  30 tablet    Refill:  1    Patient Instructions  Medication Instructions:  Your physician has recommended you make the following change in your medication:  1. Start Imdur one tablet (30 mg ) daily, sent in today to requested pharmacy.   *If you need a refill on your cardiac medications before your next appointment, please call your pharmacy*  Lab Work: Your physician recommends that you return for a FASTING lipid profile/cmet/cbc  If you have labs (blood work) drawn today and your tests are completely normal, you will receive your results only by: Marland Kitchen MyChart Message (if you have MyChart) OR . A paper copy in the mail If you have any lab test that is abnormal or we need to change your treatment, we will call you to review the results.  Testing/Procedures:  Your provider has recommended a cardiac catherization.   COVID SCREENING INFORMATION: You are scheduled for your COVID screening on Thursday, November at 10-3. [place] [facility] (old [facility]) [address] Stay in the RIGHT lane and proceed under the brick awning (NOT the tent) and tell them you are there for pre-procedure testing. Do NOT bring any pets with you to  the testing site  You are scheduled for a cardiac catheterization on Monday, November 23 with Dr. Neita Garnet or associate.  Please arrive at the [facility] (Main Entrance) at [facility] at [address] [place] Stay on Monday, November 23 at 5:30 am.  Free valet parking is available.   You are allowed only one visitor in the hospital with you. Both you and your visitor must wear masks.    Special note: Every effort is made to have your procedure done on time.   Please understand that emergencies sometimes delay a scheduled   procedure.  No solid foods after midnight on Sunday, November 22. You may have clear liquids until 5 am on the day of your procedure.  You may take your morning medications with a sip of water on the day of your procedure.  Please take a baby aspirin (81 mg) on the morning of your procedure.   Medications to [place] for a one night stay -- bring personal belongings.  Bring a current list of your medications and current insurance cards.  You MUST have a responsible person to drive you home. Someone MUST be with you the first 24 hours after you arrive home or your discharge will be delayed. Wear clothes that are easy to get on and off and wear slip on shoes.    Coronary Angiogram A coronary angiogram, also called coronary angiography, is an X-ray procedure used to look  at the arteries in the heart. In this procedure, a dye (contrast dye) is injected through a long, hollow tube (catheter). The catheter is about the size of a piece of cooked spaghetti and is inserted through your groin, wrist, or arm. The dye is injected into each artery, and X-rays are then taken to show if there is a blockage in the arteries of your heart.  LET [facility] CARE PROVIDER KNOW ABOUT:  Any allergies you have, including allergies to shellfish or contrast dye.    All medicines you are taking, including vitamins, herbs, eye drops, creams, and  over-the-counter medicines.    Previous problems you or members of your family have had with the use of anesthetics.    Any blood disorders you have.    Previous surgeries you have had.  History of kidney problems or failure.    Other medical conditions you have.  RISKS AND COMPLICATIONS  Generally, a coronary angiogram is a safe procedure. However, about 1 person out of 1000 can have problems that may include:  Allergic reaction to the dye.  Bleeding/bruising from the access site or other locations.  Kidney injury, especially in people with impaired kidney function.   Stroke (rare).  Heart attack (rare).  Irregular rhythms (rare)  Death (rare)  BEFORE THE PROCEDURE   Do not eat or drink anything after midnight the night before the procedure or as directed by your health care provider.    Ask your health care provider about changing or stopping your regular medicines. This is especially important if you are taking diabetes medicines or blood thinners.  PROCEDURE  You may be given a medicine to help you relax (sedative) before the procedure. This medicine is given through an intravenous (IV) access tube that is inserted into one of your veins.    The area where the catheter will be inserted will be washed and shaved. This is usually done in the groin but may be done in the fold of your arm (near your elbow) or in the wrist.     A medicine will be given to numb the area where the catheter will be inserted (local anesthetic).    The health care provider will insert the catheter into an artery. The catheter will be guided by using a special type of X-ray (fluoroscopy) of the blood vessel being examined.    A special dye will then be injected into the catheter, and X-rays will be taken. The dye will help to show where any narrowing or blockages are located in the heart arteries.     AFTER THE PROCEDURE   If the procedure is done through the leg, you will be kept in bed  lying flat for several hours. You will be instructed to not bend or cross your legs.  The insertion site will be checked frequently.    The pulse in your feet or wrist will be checked frequently.    Additional blood tests, X-rays, and an electrocardiogram may be done.       Follow-Up: At [facility], you and your health needs are our priority.  As part of our continuing mission to provide you with exceptional heart care, we have created designated Provider Care Teams.  These Care Teams include your primary Cardiologist (physician) and Advanced Practice Providers (APPs -  Physician Assistants and Nurse Practitioners) who all work together to provide you with the care you need, when you need it.  Your next appointment:   4-6 weeks  The  format for your next appointment:   In Person  Your physician recommends that you keep your scheduled  follow-up appointment with Dr.Skains.   Provider:   Candee Furbish, MD        Signed, Darreld Mclean, PA-C  08/21/2019 5:47 PM    [place]

## 2019-08-21 ENCOUNTER — Encounter (INDEPENDENT_AMBULATORY_CARE_PROVIDER_SITE_OTHER): Payer: Self-pay

## 2019-08-21 ENCOUNTER — Other Ambulatory Visit: Payer: Self-pay

## 2019-08-21 ENCOUNTER — Encounter: Payer: Self-pay | Admitting: Student

## 2019-08-21 ENCOUNTER — Ambulatory Visit (INDEPENDENT_AMBULATORY_CARE_PROVIDER_SITE_OTHER): Payer: Medicare Other | Admitting: Student

## 2019-08-21 VITALS — BP 132/72 | HR 65 | Ht 70.0 in | Wt 209.8 lb

## 2019-08-21 DIAGNOSIS — G4730 Sleep apnea, unspecified: Secondary | ICD-10-CM

## 2019-08-21 DIAGNOSIS — I209 Angina pectoris, unspecified: Secondary | ICD-10-CM | POA: Diagnosis not present

## 2019-08-21 DIAGNOSIS — I10 Essential (primary) hypertension: Secondary | ICD-10-CM

## 2019-08-21 DIAGNOSIS — Z95820 Peripheral vascular angioplasty status with implants and grafts: Secondary | ICD-10-CM | POA: Diagnosis not present

## 2019-08-21 DIAGNOSIS — Z01812 Encounter for preprocedural laboratory examination: Secondary | ICD-10-CM | POA: Diagnosis not present

## 2019-08-21 DIAGNOSIS — I25119 Atherosclerotic heart disease of native coronary artery with unspecified angina pectoris: Secondary | ICD-10-CM | POA: Diagnosis not present

## 2019-08-21 DIAGNOSIS — I77810 Thoracic aortic ectasia: Secondary | ICD-10-CM

## 2019-08-21 MED ORDER — ISOSORBIDE MONONITRATE ER 30 MG PO TB24: 30.0000 mg | ORAL_TABLET | Freq: Every day | ORAL | 1 refills | Status: DC

## 2019-08-21 NOTE — Patient Instructions (Addendum)
Medication Instructions:  Your physician has recommended you make the following change in your medication:  1. Start Imdur one tablet (30 mg ) daily, sent in today to requested pharmacy.   *If you need a refill on your cardiac medications before your next appointment, please call your pharmacy*  Lab Work: Your physician recommends that you return for a FASTING lipid profile/cmet/cbc  If you have labs (blood work) drawn today and your tests are completely normal, you will receive your results only by: Marland Kitchen MyChart Message (if you have MyChart) OR . A paper copy in the mail If you have any lab test that is abnormal or we need to change your treatment, we will call you to review the results.  Testing/Procedures:  Your provider has recommended a cardiac catherization.   COVID SCREENING INFORMATION: You are scheduled for your COVID screening on Thursday, November at 10-3. [place] [facility] ([facility]) [address] Stay in the RIGHT lane and proceed under the brick awning (NOT the tent) and tell them you are there for pre-procedure testing. Do NOT bring any pets with you to the testing site  You are scheduled for a cardiac catheterization on Monday, November 23 with Dr. Neita Garnet or associate.  Please arrive at the [facility] (Main Entrance) at [facility] at [address], [place] Stay on Monday, November 23 at 5:30 am.  Free valet parking is available.   You are allowed only one visitor in the hospital with you. Both you and your visitor must wear masks.    Special note: Every effort is made to have your procedure done on time.   Please understand that emergencies sometimes delay a scheduled   procedure.  No solid foods after midnight on Sunday, November 22. You may have clear liquids until 5 am on the day of your procedure.  You may take your morning medications with a sip of water on the day of your procedure.  Please take a  baby aspirin (81 mg) on the morning of your procedure.   Medications to [place] for a one night stay -- bring personal belongings.  Bring a current list of your medications and current insurance cards.  You MUST have a responsible person to drive you home. Someone MUST be with you the first 24 hours after you arrive home or your discharge will be delayed. Wear clothes that are easy to get on and off and wear slip on shoes.    Coronary Angiogram A coronary angiogram, also called coronary angiography, is an X-ray procedure used to look at the arteries in the heart. In this procedure, a dye (contrast dye) is injected through a long, hollow tube (catheter). The catheter is about the size of a piece of cooked spaghetti and is inserted through your groin, wrist, or arm. The dye is injected into each artery, and X-rays are then taken to show if there is a blockage in the arteries of your heart.  LET [facility] CARE PROVIDER KNOW ABOUT:  Any allergies you have, including allergies to shellfish or contrast dye.    All medicines you are taking, including vitamins, herbs, eye drops, creams, and over-the-counter medicines.    Previous problems you or members of your family have had with the use of anesthetics.    Any blood disorders you have.    Previous surgeries you have had.  History of kidney problems or failure.  Other medical conditions you have.  RISKS AND COMPLICATIONS  Generally, a coronary angiogram is a safe procedure. However, about 1 person out of 1000 can have problems that may include:  Allergic reaction to the dye.  Bleeding/bruising from the access site or other locations.  Kidney injury, especially in people with impaired kidney function.   Stroke (rare).  Heart attack (rare).  Irregular rhythms (rare)  Death (rare)  BEFORE THE PROCEDURE   Do not eat or drink anything after midnight the night before the procedure or as directed by your health care  provider.    Ask your health care provider about changing or stopping your regular medicines. This is especially important if you are taking diabetes medicines or blood thinners.  PROCEDURE  You may be given a medicine to help you relax (sedative) before the procedure. This medicine is given through an intravenous (IV) access tube that is inserted into one of your veins.    The area where the catheter will be inserted will be washed and shaved. This is usually done in the groin but may be done in the fold of your arm (near your elbow) or in the wrist.     A medicine will be given to numb the area where the catheter will be inserted (local anesthetic).    The health care provider will insert the catheter into an artery. The catheter will be guided by using a special type of X-ray (fluoroscopy) of the blood vessel being examined.    A special dye will then be injected into the catheter, and X-rays will be taken. The dye will help to show where any narrowing or blockages are located in the heart arteries.     AFTER THE PROCEDURE   If the procedure is done through the leg, you will be kept in bed lying flat for several hours. You will be instructed to not bend or cross your legs.  The insertion site will be checked frequently.    The pulse in your feet or wrist will be checked frequently.    Additional blood tests, X-rays, and an electrocardiogram may be done.       Follow-Up: At [facility], you and your health needs are our priority.  As part of our continuing mission to provide you with exceptional heart care, we have created designated Provider Care Teams.  These Care Teams include your primary Cardiologist (physician) and Advanced Practice Providers (APPs -  Physician Assistants and Nurse Practitioners) who all work together to provide you with the care you need, when you need it.  Your next appointment:   4-6 weeks  The format for your next appointment:   In Person  Your  physician recommends that you keep your scheduled  follow-up appointment with Dr.Skains.   Provider:   Candee Furbish, MD

## 2019-08-22 ENCOUNTER — Ambulatory Visit: Payer: Medicare Other | Admitting: Thoracic Surgery (Cardiothoracic Vascular Surgery)

## 2019-08-23 ENCOUNTER — Other Ambulatory Visit: Payer: Self-pay

## 2019-08-23 ENCOUNTER — Ambulatory Visit
Admission: RE | Admit: 2019-08-23 | Discharge: 2019-08-23 | Disposition: A | Discharge status: Home / Self Care | Payer: Medicare Other | Source: Ambulatory Visit | Attending: Pulmonary Disease | Admitting: Pulmonary Disease

## 2019-08-23 DIAGNOSIS — R911 Solitary pulmonary nodule: Secondary | ICD-10-CM

## 2019-08-23 MED ORDER — IOPAMIDOL (ISOVUE-300) INJECTION 61%
75.0000 mL | Freq: Once | INTRAVENOUS | Status: AC | PRN
  Administered 2019-08-23: 75 mL via INTRAVENOUS

## 2019-08-24 ENCOUNTER — Encounter (HOSPITAL_COMMUNITY): Payer: Self-pay

## 2019-08-24 ENCOUNTER — Ambulatory Visit (HOSPITAL_COMMUNITY): Admit: 2019-08-24 | Payer: Medicare Other | Admitting: Interventional Cardiology

## 2019-08-24 SURGERY — CORONARY STENT INTERVENTION
Anesthesia: LOCAL

## 2019-08-25 ENCOUNTER — Other Ambulatory Visit: Payer: Self-pay | Admitting: Pulmonary Disease

## 2019-08-25 DIAGNOSIS — J849 Interstitial pulmonary disease, unspecified: Secondary | ICD-10-CM

## 2019-08-25 NOTE — Progress Notes (Signed)
Reviewed CT results with patient.  So far still showing resolving inflammatory mass.  This is still good news.  Continue to work with cardiology.  We will repeat a CT of your chest in 6 months.  This will be a high-resolution CT chest to further evaluate your lungs with better detail.  [name], FNP

## 2019-08-28 ENCOUNTER — Other Ambulatory Visit: Payer: Self-pay

## 2019-08-31 ENCOUNTER — Other Ambulatory Visit (HOSPITAL_COMMUNITY)
Admission: RE | Admit: 2019-08-31 | Discharge: 2019-08-31 | Disposition: A | Discharge status: Home / Self Care | Payer: Medicare Other | Source: Ambulatory Visit | Attending: Cardiology | Admitting: Cardiology

## 2019-08-31 ENCOUNTER — Telehealth: Payer: Self-pay | Admitting: *Deleted

## 2019-08-31 ENCOUNTER — Other Ambulatory Visit: Payer: Self-pay

## 2019-08-31 ENCOUNTER — Other Ambulatory Visit: Payer: Medicare Other | Admitting: *Deleted

## 2019-08-31 DIAGNOSIS — Z95820 Peripheral vascular angioplasty status with implants and grafts: Secondary | ICD-10-CM

## 2019-08-31 DIAGNOSIS — Z01812 Encounter for preprocedural laboratory examination: Secondary | ICD-10-CM

## 2019-08-31 DIAGNOSIS — I209 Angina pectoris, unspecified: Secondary | ICD-10-CM

## 2019-08-31 DIAGNOSIS — Z20828 Contact with and (suspected) exposure to other viral communicable diseases: Secondary | ICD-10-CM | POA: Diagnosis not present

## 2019-08-31 DIAGNOSIS — I25119 Atherosclerotic heart disease of native coronary artery with unspecified angina pectoris: Secondary | ICD-10-CM

## 2019-08-31 LAB — CBC WITH DIFFERENTIAL/PLATELET
Basophils Absolute: 0.1 10*3/uL (ref 0.0–0.2)
Basos: 1 %
EOS (ABSOLUTE): 0.1 10*3/uL (ref 0.0–0.4)
Eos: 3 %
Hematocrit: 41.6 % (ref 37.5–51.0)
Hemoglobin: 14 g/dL (ref 13.0–17.7)
Immature Grans (Abs): 0 10*3/uL (ref 0.0–0.1)
Immature Granulocytes: 0 %
Lymphocytes Absolute: 1.3 10*3/uL (ref 0.7–3.1)
Lymphs: 28 %
MCH: 29.1 pg (ref 26.6–33.0)
MCHC: 33.7 g/dL (ref 31.5–35.7)
MCV: 87 fL (ref 79–97)
Monocytes Absolute: 0.5 10*3/uL (ref 0.1–0.9)
Monocytes: 11 %
Neutrophils Absolute: 2.7 10*3/uL (ref 1.4–7.0)
Neutrophils: 57 %
Platelets: 177 10*3/uL (ref 150–450)
RBC: 4.81 x10E6/uL (ref 4.14–5.80)
RDW: 13.3 % (ref 11.6–15.4)
WBC: 4.7 10*3/uL (ref 3.4–10.8)

## 2019-08-31 LAB — COMPREHENSIVE METABOLIC PANEL WITH GFR
ALT: 15 IU/L (ref 0–44)
AST: 20 IU/L (ref 0–40)
Albumin/Globulin Ratio: 1.6 (ref 1.2–2.2)
Albumin: 4.2 g/dL (ref 3.8–4.8)
Alkaline Phosphatase: 85 IU/L (ref 39–117)
BUN/Creatinine Ratio: 16 (ref 10–24)
BUN: 15 mg/dL (ref 8–27)
Bilirubin Total: 0.5 mg/dL (ref 0.0–1.2)
CO2: 25 mmol/L (ref 20–29)
Calcium: 9.2 mg/dL (ref 8.6–10.2)
Chloride: 105 mmol/L (ref 96–106)
Creatinine, Ser: 0.93 mg/dL (ref 0.76–1.27)
GFR calc Af Amer: 98 mL/min/1.73
GFR calc non Af Amer: 85 mL/min/1.73
Globulin, Total: 2.7 g/dL (ref 1.5–4.5)
Glucose: 101 mg/dL — ABNORMAL HIGH (ref 65–99)
Potassium: 3.9 mmol/L (ref 3.5–5.2)
Sodium: 142 mmol/L (ref 134–144)
Total Protein: 6.9 g/dL (ref 6.0–8.5)

## 2019-08-31 LAB — LIPID PANEL
Chol/HDL Ratio: 2.5 ratio (ref 0.0–5.0)
Cholesterol, Total: 116 mg/dL (ref 100–199)
HDL: 46 mg/dL (ref >39)
LDL Chol Calc (NIH): 51 mg/dL (ref 0–99)
Triglycerides: 103 mg/dL (ref 0–149)
VLDL Cholesterol Cal: 19 mg/dL (ref 5–40)

## 2019-08-31 NOTE — Telephone Encounter (Signed)
Pt contacted pre-coronary stent intervention scheduled at [facility] for: Monday September 04, 2019 7:30 AM Verified arrival time and place: [facility]) at: 5:30 AM   No solid food after midnight prior to cath, clear liquids until 5 AM day of procedure. Contrast allergy: no   AM meds can be  taken pre-cath with sip of water including: ASA 81 mg Plavix 75 mg  Confirmed patient has responsible adult to drive home post procedure and observe 24 hours after arriving home: yes  Currently, due to Covid-19 pandemic, only one support person will be allowed with patient. Must be the same support person for that patient's entire stay, will be screened and required to wear a mask. They will be asked to wait in the waiting room for the duration of the patient's stay.  Patients are required to wear a mask when they enter the hospital.      COVID-19 Pre-Screening Questions:  . In the past 7 to 10 days have you had a cough,  shortness of breath, headache, congestion, fever (100 or greater) body aches, chills, sore throat, or sudden loss of taste or sense of smell? no . Have you been around anyone with known Covid 19? no . Have you been around anyone who is awaiting Covid 19 test results in the past 7 to 10 days? no . Have you been around anyone who has been exposed to Covid 19, or has mentioned symptoms of Covid 19 within the past 7 to 10 days? no   I reviewed procedure/mask/visitor instructions, Covid-19 screening questions with patient, he verbalized understanding, thanked me for call.

## 2019-09-01 ENCOUNTER — Telehealth: Payer: Self-pay | Admitting: Student

## 2019-09-01 LAB — NOVEL CORONAVIRUS, NAA (HOSP ORDER, SEND-OUT TO REF LAB; TAT 18-24 HRS): SARS-CoV-2, NAA: NOT DETECTED

## 2019-09-01 NOTE — Telephone Encounter (Signed)
   Called and notified patient of pre-cath labs. Labs look great and OK to proceed with cardiac cath on 09/04/2019 assuming COVID test comes back negative. The patient understands that risks include but are not limited to stroke (1 in 1000), death (1 in 19), kidney failure [usually temporary] (1 in 500), bleeding (1 in 200), allergic reaction [possibly serious] (1 in 200), and agrees to proceed.   Patient aware of what time to arrive and knows to be NPO at midnight.  Darreld Mclean, PA-C 09/01/2019 10:12 AM

## 2019-09-04 ENCOUNTER — Other Ambulatory Visit: Payer: Self-pay

## 2019-09-04 ENCOUNTER — Telehealth (HOSPITAL_COMMUNITY): Payer: Self-pay

## 2019-09-04 ENCOUNTER — Ambulatory Visit (HOSPITAL_COMMUNITY)
Admission: RE | Admit: 2019-09-04 | Discharge: 2019-09-04 | Disposition: A | Discharge status: Home / Self Care | Payer: Medicare Other | Attending: Cardiology | Admitting: Cardiology

## 2019-09-04 ENCOUNTER — Ambulatory Visit (HOSPITAL_COMMUNITY)
Admission: RE | Disposition: A | Discharge status: Home / Self Care | Payer: Self-pay | Source: Home / Self Care | Attending: Cardiology

## 2019-09-04 DIAGNOSIS — I209 Angina pectoris, unspecified: Secondary | ICD-10-CM | POA: Diagnosis present

## 2019-09-04 DIAGNOSIS — Z955 Presence of coronary angioplasty implant and graft: Secondary | ICD-10-CM | POA: Diagnosis not present

## 2019-09-04 DIAGNOSIS — K219 Gastro-esophageal reflux disease without esophagitis: Secondary | ICD-10-CM | POA: Diagnosis not present

## 2019-09-04 DIAGNOSIS — Z79899 Other long term (current) drug therapy: Secondary | ICD-10-CM | POA: Diagnosis not present

## 2019-09-04 DIAGNOSIS — I255 Ischemic cardiomyopathy: Secondary | ICD-10-CM | POA: Insufficient documentation

## 2019-09-04 DIAGNOSIS — Z7982 Long term (current) use of aspirin: Secondary | ICD-10-CM | POA: Insufficient documentation

## 2019-09-04 DIAGNOSIS — Z95820 Peripheral vascular angioplasty status with implants and grafts: Secondary | ICD-10-CM

## 2019-09-04 DIAGNOSIS — I2510 Atherosclerotic heart disease of native coronary artery without angina pectoris: Secondary | ICD-10-CM | POA: Diagnosis present

## 2019-09-04 DIAGNOSIS — I77810 Thoracic aortic ectasia: Secondary | ICD-10-CM | POA: Diagnosis not present

## 2019-09-04 DIAGNOSIS — Z87891 Personal history of nicotine dependence: Secondary | ICD-10-CM | POA: Insufficient documentation

## 2019-09-04 DIAGNOSIS — I25119 Atherosclerotic heart disease of native coronary artery with unspecified angina pectoris: Secondary | ICD-10-CM

## 2019-09-04 DIAGNOSIS — N400 Enlarged prostate without lower urinary tract symptoms: Secondary | ICD-10-CM | POA: Insufficient documentation

## 2019-09-04 DIAGNOSIS — Z7902 Long term (current) use of antithrombotics/antiplatelets: Secondary | ICD-10-CM | POA: Insufficient documentation

## 2019-09-04 DIAGNOSIS — I10 Essential (primary) hypertension: Secondary | ICD-10-CM | POA: Diagnosis not present

## 2019-09-04 DIAGNOSIS — R0683 Snoring: Secondary | ICD-10-CM | POA: Diagnosis not present

## 2019-09-04 HISTORY — PX: CORONARY STENT INTERVENTION: CATH118234

## 2019-09-04 LAB — POCT ACTIVATED CLOTTING TIME
Activated Clotting Time: 263 seconds
Activated Clotting Time: 285 seconds

## 2019-09-04 SURGERY — CORONARY STENT INTERVENTION
Anesthesia: LOCAL

## 2019-09-04 MED ORDER — ASPIRIN EC 81 MG PO TBEC: 81.0000 mg | DELAYED_RELEASE_TABLET | Freq: Every day | ORAL | Status: DC

## 2019-09-04 MED ORDER — HEPARIN SODIUM (PORCINE) 1000 UNIT/ML IJ SOLN
INTRAMUSCULAR | Status: DC | PRN
  Administered 2019-09-04: 9000 [IU] via INTRAVENOUS

## 2019-09-04 MED ORDER — NITROGLYCERIN 0.4 MG SL SUBL: 0.4000 mg | SUBLINGUAL_TABLET | SUBLINGUAL | Status: DC | PRN

## 2019-09-04 MED ORDER — CLOPIDOGREL BISULFATE 75 MG PO TABS: 75.0000 mg | ORAL_TABLET | Freq: Every day | ORAL | Status: DC

## 2019-09-04 MED ORDER — LIDOCAINE HCL (PF) 1 % IJ SOLN
INTRAMUSCULAR | Status: DC | PRN
  Administered 2019-09-04: 2 mL via INTRADERMAL

## 2019-09-04 MED ORDER — SODIUM CHLORIDE 0.9% FLUSH: 3.0000 mL | INTRAVENOUS | Status: DC | PRN

## 2019-09-04 MED ORDER — VITAMIN D3 50 MCG (2000 UT) PO CAPS: ORAL_CAPSULE | Freq: Every day | ORAL | Status: DC

## 2019-09-04 MED ORDER — LIDOCAINE HCL (PF) 1 % IJ SOLN
INTRAMUSCULAR | Status: AC
  Filled 2019-09-04: qty 30

## 2019-09-04 MED ORDER — SODIUM CHLORIDE 0.9 % IV SOLN: 250.0000 mL | INTRAVENOUS | Status: DC | PRN

## 2019-09-04 MED ORDER — SODIUM CHLORIDE 0.9 % IV SOLN
INTRAVENOUS | Status: DC
  Administered 2019-09-04: 07:00:00 via INTRAVENOUS

## 2019-09-04 MED ORDER — SODIUM CHLORIDE 0.9 % WEIGHT BASED INFUSION: 1.0000 mL/kg/h | INTRAVENOUS | Status: DC

## 2019-09-04 MED ORDER — MIDAZOLAM HCL 2 MG/2ML IJ SOLN
INTRAMUSCULAR | Status: DC | PRN
  Administered 2019-09-04: 1 mg via INTRAVENOUS

## 2019-09-04 MED ORDER — ROSUVASTATIN CALCIUM 5 MG PO TABS: 10.0000 mg | ORAL_TABLET | Freq: Every day | ORAL | Status: DC

## 2019-09-04 MED ORDER — ACETAMINOPHEN 325 MG PO TABS: 650.0000 mg | ORAL_TABLET | Freq: Four times a day (QID) | ORAL | Status: DC | PRN

## 2019-09-04 MED ORDER — ASPIRIN 81 MG PO CHEW: 81.0000 mg | CHEWABLE_TABLET | ORAL | Status: DC

## 2019-09-04 MED ORDER — SODIUM CHLORIDE 0.9% FLUSH: 3.0000 mL | Freq: Two times a day (BID) | INTRAVENOUS | Status: DC

## 2019-09-04 MED ORDER — PANTOPRAZOLE SODIUM 40 MG PO TBEC: 40.0000 mg | DELAYED_RELEASE_TABLET | Freq: Every day | ORAL | Status: DC

## 2019-09-04 MED ORDER — HEPARIN (PORCINE) IN NACL 1000-0.9 UT/500ML-% IV SOLN
INTRAVENOUS | Status: AC
  Filled 2019-09-04: qty 1000

## 2019-09-04 MED ORDER — FENTANYL CITRATE (PF) 100 MCG/2ML IJ SOLN
INTRAMUSCULAR | Status: DC | PRN
  Administered 2019-09-04: 25 ug via INTRAVENOUS

## 2019-09-04 MED ORDER — METOPROLOL SUCCINATE ER 50 MG PO TB24: 50.0000 mg | ORAL_TABLET | Freq: Every day | ORAL | Status: DC

## 2019-09-04 MED ORDER — VERAPAMIL HCL 2.5 MG/ML IV SOLN
INTRAVENOUS | Status: AC
  Filled 2019-09-04: qty 2

## 2019-09-04 MED ORDER — VERAPAMIL HCL 2.5 MG/ML IV SOLN
INTRAVENOUS | Status: DC | PRN
  Administered 2019-09-04: 10 mL via INTRA_ARTERIAL

## 2019-09-04 MED ORDER — IOHEXOL 350 MG/ML SOLN
INTRAVENOUS | Status: DC | PRN
  Administered 2019-09-04: 100 mL via INTRA_ARTERIAL

## 2019-09-04 MED ORDER — ISOSORBIDE MONONITRATE ER 30 MG PO TB24: 30.0000 mg | ORAL_TABLET | Freq: Every day | ORAL | Status: DC

## 2019-09-04 MED ORDER — ONDANSETRON HCL 4 MG/2ML IJ SOLN: 4.0000 mg | Freq: Four times a day (QID) | INTRAMUSCULAR | Status: DC | PRN

## 2019-09-04 MED ORDER — FENTANYL CITRATE (PF) 100 MCG/2ML IJ SOLN
INTRAMUSCULAR | Status: AC
  Filled 2019-09-04: qty 2

## 2019-09-04 MED ORDER — HEPARIN (PORCINE) IN NACL 1000-0.9 UT/500ML-% IV SOLN
INTRAVENOUS | Status: DC | PRN
  Administered 2019-09-04 (×2): 500 mL

## 2019-09-04 MED ORDER — NITROGLYCERIN 1 MG/10 ML FOR IR/CATH LAB
INTRA_ARTERIAL | Status: AC
  Filled 2019-09-04: qty 10

## 2019-09-04 MED ORDER — VITAMIN C 500 MG PO TABS: 1000.0000 mg | ORAL_TABLET | Freq: Every day | ORAL | Status: DC

## 2019-09-04 MED ORDER — HEPARIN SODIUM (PORCINE) 1000 UNIT/ML IJ SOLN
INTRAMUSCULAR | Status: AC
  Filled 2019-09-04: qty 1

## 2019-09-04 MED ORDER — MIDAZOLAM HCL 2 MG/2ML IJ SOLN
INTRAMUSCULAR | Status: AC
  Filled 2019-09-04: qty 2

## 2019-09-04 SURGICAL SUPPLY — 20 items
BALLN SAPPHIRE 2.5X12 (BALLOONS) ×2
BALLN ~~LOC~~ EMERGE MR 3.75X6 (BALLOONS) ×2
BALLOON SAPPHIRE 2.5X12 (BALLOONS) IMPLANT
BALLOON ~~LOC~~ EMERGE MR 3.75X6 (BALLOONS) IMPLANT
CABLE SURGICAL S-101-97-12 (CABLE) IMPLANT
CATH LAUNCHER 6FR EBU3.5 (CATHETERS) ×1 IMPLANT
DEVICE RAD COMP TR BAND LRG (VASCULAR PRODUCTS) ×1 IMPLANT
ELECT DEFIB PAD ADLT CADENCE (PAD) ×1 IMPLANT
GLIDESHEATH SLEND SS 6F .021 (SHEATH) ×1 IMPLANT
GUIDEWIRE INQWIRE 1.5J.035X260 (WIRE) IMPLANT
INQWIRE 1.5J .035X260CM (WIRE) ×2
KIT ENCORE 26 ADVANTAGE (KITS) ×1 IMPLANT
KIT HEART LEFT (KITS) ×2 IMPLANT
PACK CARDIAC CATHETERIZATION (CUSTOM PROCEDURE TRAY) ×2 IMPLANT
STENT SYNERGY DES 3.5X8 (Permanent Stent) ×1 IMPLANT
STENT SYNERGY DES 3X12 (Permanent Stent) ×1 IMPLANT
TRANSDUCER W/STOPCOCK (MISCELLANEOUS) ×2 IMPLANT
TUBING CIL FLEX 10 FLL-RA (TUBING) ×2 IMPLANT
WIRE ASAHI PROWATER 180CM (WIRE) ×1 IMPLANT
WIRE HI TORQ WHISPER MS 190CM (WIRE) ×1 IMPLANT

## 2019-09-04 NOTE — Discharge Summary (Signed)
Discharge Summary/SAME DAY PCI    Patient ID: Henry [name],  MRN: [id number], DOB/AGE: 1950/12/27 68 y.o.  Admit date: 09/04/2019 Discharge date: 09/04/2019  Primary Care Provider: [facility] Primary Cardiologist: Dr. Marlou Porch  Discharge Diagnoses    Principal Problem:   Angina pectoris [facility]) Active Problems:   Essential hypertension   CAD (coronary artery disease), native coronary artery   Allergies No Known Allergies  Diagnostic Studies/Procedures    Cath: 09/04/19   Prox Cx lesion is 75% stenosed.  Post intervention, there is a 0% residual stenosis.  A drug-eluting stent was successfully placed using a STENT SYNERGY DES 3X12.  A drug-eluting stent was successfully placed using a STENT SYNERGY DES 3.5X8.   1. Successful PCI of the proximal LCx with DES x 2.  Plan: DAPT for minimum of 6 months. Anticipate same day discharge.   Diagnostic Dominance: Right  Intervention    _____________   History of Present Illness     Henry [name] is a 68 y.o. male with a history of CAD s/p recent DES to mid LAD, hypertension, GERD, and BPH who is followed by Dr. Marlou Porch and presented to the office for follow-up of recent cardiac catheterization.  Patient first seen by Dr. Marlou Porch on 08/08/2019 for evaluation of dyspnea at the request of Dr. Roxan Hockey and [name], NP, with Pulmonology. He has been followed by Pulmonology for a right hilar mass with potential for possible surgical intervention.  However, mass as been decreasing in size and was now being followed with CT scans. More recently patient reported chest pain with exertion that resoled with rest as well as associated dyspnea and decreased energy. Recent chest CT showed dense coronary artery calcifications in the LAD distribution as well as aortic atherosclerosis. Therefore, outpatient cardiac catheterization was ordered. Cath was performed on 08/11/2019 and showed heavily calcified 90% stenosis  of mid LAD as well as 40% stenosis of the mid to distal left main, 25% of the proximal LAD, 75% stenosis of the proximal CX, and 50% stenosis of the 2nd Diag. LVEF was 35-45% but LVEDP was normal. Patient underwent successful orbital atherectomy followed by DES to mid LAD lesion with plans for PCI of CX at a later time. Patient started on dual-antiplatelet therapy with Aspirin 81mg  daily and Plavix 75mg  daily. Echo on 08/15/2019 showed LVEF of 35-40% with global hypokinesis and grade 1 diastolic dysfunction.   He presented back to the office on 08/21/19 for follow up. Reported one episode of substernal burning chest pain while walking that he stated lasted about 5 minutes. Did not require Nitro. He stated it was similar to the pain that he has had for years but not as severe. He also noted chronic dyspnea with activity but no significant worsening. He noted some possible orthopnea and PND. He stated he snores but has never been tested for sleep apnea. No lower extremity edema, palpitations, lightheadedness, or dizziness. Discussed results of cath and Echo with patient. 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 He stated he could not do staged PCI of left circumflex for 1.5-2 weeks because he had to get his current job done.   Situation was discussed with the patient who understood the risk of waiting for procedure. He was scheduled as an outpatient.   Hospital Course     Underwent cardiac cath noted above with successful PCI/DES x2 to the pLCx. Plan for DAPT with ASA/plavix for at least 6 months. Seen  by cardiac rehab while in short stay. No complications post cath. Radial site stable. Instructions/precautions regarding cath site care given prior to discharge. He was continued on his home medications without significant changes. Noted plans to attempt Entresto start at outpatient follow up appt.    [address] was seen by Dr. Martinique and determined stable for discharge home. Follow  up in the office has been arranged. Medications are listed below.   _____________  Discharge Vitals Blood pressure (!) 151/62, pulse 64, temperature 97.7 F (36.5 C), temperature source Oral, resp. rate 16, height 5\' 11"  (1.803 m), weight 93 kg, SpO2 100 %.  Filed Weights   09/04/19 0601  Weight: 93 kg    Labs & Radiologic Studies    CBC No results for input(s): WBC, NEUTROABS, HGB, HCT, MCV, PLT in the last 72 hours. Basic Metabolic Panel No results for input(s): NA, K, CL, CO2, GLUCOSE, BUN, CREATININE, CALCIUM, MG, PHOS in the last 72 hours. Liver Function Tests No results for input(s): AST, ALT, ALKPHOS, BILITOT, PROT, ALBUMIN in the last 72 hours. No results for input(s): LIPASE, AMYLASE in the last 72 hours. Cardiac Enzymes No results for input(s): CKTOTAL, CKMB, CKMBINDEX, TROPONINI in the last 72 hours. BNP Invalid input(s): POCBNP D-Dimer No results for input(s): DDIMER in the last 72 hours. Hemoglobin A1C No results for input(s): HGBA1C in the last 72 hours. Fasting Lipid Panel No results for input(s): CHOL, HDL, LDLCALC, TRIG, CHOLHDL, LDLDIRECT in the last 72 hours. Thyroid Function Tests No results for input(s): TSH, T4TOTAL, T3FREE, THYROIDAB in the last 72 hours.  Invalid input(s): FREET3 _____________  Ct Chest W Contrast  Result Date: 08/23/2019 CLINICAL DATA:  Follow-up lung mass EXAM: CT CHEST WITH CONTRAST TECHNIQUE: Multidetector CT imaging of the chest was performed during intravenous contrast administration. CONTRAST:  72mL ISOVUE-300 IOPAMIDOL (ISOVUE-300) INJECTION 61% COMPARISON:  CT chest, 07/06/2019, PET-CT, 05/24/2019, CT chest, 05/17/2019, CT abdomen pelvis, 08/02/2011 FINDINGS: Cardiovascular: Normal heart size. LAD coronary artery calcifications and/or stents. No pericardial effusion. Mediastinum/Nodes: Stable right hilar lymph node measuring 1.5 x 1.4 cm (series 2, image 84). Thyroid gland, trachea, and esophagus demonstrate no significant  findings. Lungs/Pleura: Mild paraseptal emphysema. There is mild, bibasilar predominant reticular peripheral interstitial opacity. No significant bronchiectasis or bronchiolectasis. Mild, diffuse bilateral bronchial wall thickening. Continued interval resolution of nodular opacity of the infrahilar right lower lobe adjacent to the inferior right pulmonary vein, now subsolid and measuring 1.1 x 1.1 cm (series 2, image 93). No pleural effusion or pneumothorax. Upper Abdomen: No acute abnormality. Musculoskeletal: No chest wall mass or suspicious bone lesions identified. IMPRESSION: 1. Continued interval resolution of nodular opacity of the infrahilar right lower lobe adjacent to the inferior right pulmonary vein, now subsolid and measuring 1.1 x 1.1 cm (series 2, image 93). Findings over time strongly favor resolving infection or inflammation. 2. Stable right hilar lymph node measuring 1.5 x 1.4 cm (series 2, image 84), likely reactive. 3. There is mild, bibasilar predominant reticular peripheral interstitial opacity. No significant bronchiectasis or bronchiolectasis. Findings again suggest mild pulmonary fibrosis, if characterized by ATS pulmonary fibrosis criteria, "Indeterminate for UIP" pattern. Differential considerations include UIP and NSIP and these findings are slightly worsened over time when compared to the included lung bases on remote prior CT examination of the abdomen pelvis dated 08/12/2011. Consider follow-up ILD protocol CT at 12 months if indicated by corresponding clinical signs and symptoms. Findings are indeterminate for UIP per consensus guidelines: Diagnosis of Idiopathic Pulmonary Fibrosis: An Official  ATS/ERS/JRS/ALAT Clinical Practice Guideline. [place], Iss 5, [phone], Jun 12 2017. 4.  Emphysema (ICD10-J43.9). 5.  Coronary artery disease. Electronically Signed   By: Eddie Candle M.D.   On: 08/23/2019 14:28   Disposition   Pt is being discharged home today in  good condition.  Follow-up Plans & Appointments    Follow-up Information    Jerline Pain, MD Follow up on 09/12/2019.   Specialty: Cardiology Why: at 10:40am for your follow up appt. Contact information: [id number] [address] [place] [address] [phone]          Discharge Instructions    Amb Referral to Cardiac Rehabilitation   Complete by: As directed    Diagnosis: Coronary Stents   After initial evaluation and assessments completed: Virtual Based Care may be provided alone or in conjunction with Phase 2 Cardiac Rehab based on patient barriers.: Yes     Discharge Medications     Medication List    TAKE these medications   acetaminophen 325 MG tablet Commonly known as: TYLENOL Take 650 mg by mouth every 6 (six) hours as needed (FOR PAIN).   aspirin EC 81 MG tablet Take 1 tablet (81 mg total) by mouth daily.   clopidogrel 75 MG tablet Commonly known as: PLAVIX Take 1 tablet (75 mg total) by mouth daily with breakfast.   isosorbide mononitrate 30 MG 24 hr tablet Commonly known as: IMDUR Take 1 tablet (30 mg total) by mouth daily.   metoprolol succinate 50 MG 24 hr tablet Commonly known as: TOPROL-XL Take 50 mg by mouth daily.   nitroGLYCERIN 0.4 MG SL tablet Commonly known as: NITROSTAT Place 1 tablet (0.4 mg total) under the tongue every 5 (five) minutes as needed for chest pain.   pantoprazole 40 MG tablet Commonly known as: PROTONIX Take 1 tablet (40 mg total) by mouth daily before breakfast.   rosuvastatin 10 MG tablet Commonly known as: CRESTOR Take 1 tablet (10 mg total) by mouth daily.   vitamin C 1000 MG tablet Take 1,000 mg by mouth daily.   Vitamin D3 50 MCG (2000 UT) capsule Take 2,000 mg by mouth daily.        No                               Did the patient have a percutaneous coronary intervention (stent / angioplasty)?:  Yes.     Cath/PCI Registry Performance & Quality Measures: 1. Aspirin prescribed? -  Yes 2. ADP Receptor Inhibitor (Plavix/Clopidogrel, Brilinta/Ticagrelor or Effient/Prasugrel) prescribed (includes medically managed patients)? - Yes 3. High Intensity Statin (Lipitor 40-80mg  or Crestor 20-40mg ) prescribed? - No - LDL 55, defer increase to primary cardiologist 4. For EF <40%, was ACEI/ARB prescribed? - No - Reason:  Planned to start Entresto at follow up appt.  5. For EF <40%, Aldosterone Antagonist (Spironolactone or Eplerenone) prescribed? - No - Reason:  consider as outpatient 6. Cardiac Rehab Phase II ordered (Included Medically managed Patients)? - Yes      Outstanding Labs/Studies   N/a   Duration of Discharge Encounter   Greater than 30 minutes including physician time.  Signed, Reino Bellis NP-C 09/04/2019, 1:17 PM

## 2019-09-04 NOTE — Discharge Instructions (Signed)
Radial Site Care ° °This sheet gives you information about how to care for yourself after your procedure. Your health care provider may also give you more specific instructions. If you have problems or questions, contact your health care provider. °What can I expect after the procedure? °After the procedure, it is common to have: °· Bruising and tenderness at the catheter insertion area. °Follow these instructions at home: °Medicines °· Take over-the-counter and prescription medicines only as told by your health care provider. °Insertion site care °· Follow instructions from your health care provider about how to take care of your insertion site. Make sure you: °? Wash your hands with soap and water before you change your bandage (dressing). If soap and water are not available, use hand sanitizer. °? Change your dressing as told by your health care provider. °? Leave stitches (sutures), skin glue, or adhesive strips in place. These skin closures may need to stay in place for 2 weeks or longer. If adhesive strip edges start to loosen and curl up, you may trim the loose edges. Do not remove adhesive strips completely unless your health care provider tells you to do that. °· Check your insertion site every day for signs of infection. Check for: °? Redness, swelling, or pain. °? Fluid or blood. °? Pus or a bad smell. °? Warmth. °· Do not take baths, swim, or use a hot tub until your health care provider approves. °· You may shower 24-48 hours after the procedure, or as directed by your health care provider. °? Remove the dressing and gently wash the site with plain soap and water. °? Pat the area dry with a clean towel. °? Do not rub the site. That could cause bleeding. °· Do not apply powder or lotion to the site. °Activity ° °· For 24 hours after the procedure, or as directed by your health care provider: °? Do not flex or bend the affected arm. °? Do not push or pull heavy objects with the affected arm. °? Do not  drive yourself home from the hospital or clinic. You may drive 24 hours after the procedure unless your health care provider tells you not to. °? Do not operate machinery or power tools. °· Do not lift anything that is heavier than 10 lb (4.5 kg), or the limit that you are told, until your health care provider says that it is safe. °· Ask your health care provider when it is okay to: °? Return to work or school. °? Resume usual physical activities or sports. °? Resume sexual activity. °General instructions °· If the catheter site starts to bleed, raise your arm and put firm pressure on the site. If the bleeding does not stop, get help right away. This is a medical emergency. °· If you went home on the same day as your procedure, a responsible adult should be with you for the first 24 hours after you arrive home. °· Keep all follow-up visits as told by your health care provider. This is important. °Contact a health care provider if: °· You have a fever. °· You have redness, swelling, or yellow drainage around your insertion site. °Get help right away if: °· You have unusual pain at the radial site. °· The catheter insertion area swells very fast. °· The insertion area is bleeding, and the bleeding does not stop when you hold steady pressure on the area. °· Your arm or hand becomes pale, cool, tingly, or numb. °These symptoms may represent a serious problem   that is an emergency. Do not wait to see if the symptoms will go away. Get medical help right away. Call your local emergency services (911 in the [place] Do not drive yourself to the hospital. °Summary °· After the procedure, it is common to have bruising and tenderness at the site. °· Follow instructions from your health care provider about how to take care of your radial site wound. Check the wound every day for signs of infection. °· Do not lift anything that is heavier than 10 lb (4.5 kg), or the limit that you are told, until your health care provider says  that it is safe. °This information is not intended to replace advice given to you by your health care provider. Make sure you discuss any questions you have with your health care provider. °Document Released: 10/31/2010 Document Revised: 11/03/2017 Document Reviewed: 11/03/2017 °Elsevier Patient Education © 2020 [employer] ° °

## 2019-09-04 NOTE — Interval H&P Note (Signed)
History and Physical Interval Note:  09/04/2019 7:31 AM  Henry [name]  has presented today for surgery, with the diagnosis of cad.  The various methods of treatment have been discussed with the patient and family. After consideration of risks, benefits and other options for treatment, the patient has consented to  Procedure(s): CORONARY STENT INTERVENTION (N/A) as a surgical intervention.  The patient's history has been reviewed, patient examined, no change in status, stable for surgery.  I have reviewed the patient's chart and labs.  Questions were answered to the patient's satisfaction.   Cath Lab Visit (complete for each Cath Lab visit)  Clinical Evaluation Leading to the Procedure:   ACS: No.  Non-ACS:    Anginal Classification: CCS III  Anti-ischemic medical therapy: Maximal Therapy (2 or more classes of medications)  Non-Invasive Test Results: No non-invasive testing performed  Prior CABG: No previous CABG        Collier Salina [facility] 09/04/2019 7:31 AM]

## 2019-09-04 NOTE — Progress Notes (Signed)
0950-1020 Pt seen by me a few weeks ago. Brief ed done to review. Stressed importance of plavix with stent. Pt stated he has diet and ex ed from last visit. Will send update to CRP 2 [place] since pt has had staged PCI.  Reviewed NTG use. Pt stated he has been walking about 15 to 20 minutes a couple of times a day. No questions re ed done previously. Graylon Good RN BSN 09/04/2019 10:21 AM

## 2019-09-05 ENCOUNTER — Encounter (HOSPITAL_COMMUNITY): Payer: Self-pay | Admitting: Cardiology

## 2019-09-05 MED FILL — Nitroglycerin IV Soln 100 MCG/ML in D5W: INTRA_ARTERIAL | Qty: 10 | Status: AC

## 2019-09-06 ENCOUNTER — Telehealth (HOSPITAL_COMMUNITY): Payer: Self-pay

## 2019-09-06 NOTE — Telephone Encounter (Signed)
Called and spoke with pt in regards to CR, pt stated he was still not interested in CR at this time.  Closed referral

## 2019-09-12 ENCOUNTER — Encounter: Payer: Self-pay | Admitting: Cardiology

## 2019-09-12 ENCOUNTER — Other Ambulatory Visit: Payer: Self-pay

## 2019-09-12 ENCOUNTER — Ambulatory Visit (INDEPENDENT_AMBULATORY_CARE_PROVIDER_SITE_OTHER): Payer: Medicare Other | Admitting: Cardiology

## 2019-09-12 VITALS — BP 130/70 | HR 82 | Ht 71.0 in | Wt 208.0 lb

## 2019-09-12 DIAGNOSIS — Z79899 Other long term (current) drug therapy: Secondary | ICD-10-CM

## 2019-09-12 DIAGNOSIS — Z95820 Peripheral vascular angioplasty status with implants and grafts: Secondary | ICD-10-CM

## 2019-09-12 DIAGNOSIS — E785 Hyperlipidemia, unspecified: Secondary | ICD-10-CM | POA: Diagnosis not present

## 2019-09-12 DIAGNOSIS — I2584 Coronary atherosclerosis due to calcified coronary lesion: Secondary | ICD-10-CM | POA: Diagnosis not present

## 2019-09-12 DIAGNOSIS — I10 Essential (primary) hypertension: Secondary | ICD-10-CM

## 2019-09-12 DIAGNOSIS — I209 Angina pectoris, unspecified: Secondary | ICD-10-CM

## 2019-09-12 DIAGNOSIS — I2510 Atherosclerotic heart disease of native coronary artery without angina pectoris: Secondary | ICD-10-CM

## 2019-09-12 MED ORDER — SACUBITRIL-VALSARTAN 24-26 MG PO TABS: 1.0000 | ORAL_TABLET | Freq: Two times a day (BID) | ORAL | 11 refills | Status: DC

## 2019-09-12 NOTE — Patient Instructions (Addendum)
Medication Instructions:  Please start Entresto 24-26 mg one tablet twice a day. Continue all other medications as listed.  *If you need a refill on your cardiac medications before your next appointment, please call your pharmacy*  Lab Work: Please have blood work 2 weeks after starting [employer].  If you have labs (blood work) drawn today and your tests are completely normal, you will receive your results only by: Marland Kitchen MyChart Message (if you have MyChart) OR . A paper copy in the mail If you have any lab test that is abnormal or we need to change your treatment, we will call you to review the results.  Follow-Up: Keep previously scheduled appointment in January 2021.  Thank you for choosing [facility]!!

## 2019-09-12 NOTE — Progress Notes (Signed)
Cardiology Office Note:    Date:  09/12/2019   ID:  VINAL NOSBISCH, DOB Mar 07, 1951, MRN [id number]  PCP:  [facility]  Cardiologist:  Candee Furbish, MD  Electrophysiologist:  None   Referring MD: Pa, [facility]*     History of Present Illness:    Henry [name] is a 68 y.o. male with coronary artery disease status post staged PCI here for follow-up.  Had LAD stent placed as well as circumflex stent.  [employer].  He was originally seen on 08/08/2019 for dyspnea at the request of Dr. Roxan Hockey and [name], NP with pulmonary.  Right hilar mass.  This was decreasing in size.  CT scan showed dense coronary calcifications, catheterization was performed which revealed critical LAD and circumflex disease.  EF was also low at 35 to 40%.  Overall doing well but no significant worsening.  Still having the sensation like he needs to take in a deep breath 2 or 3 times to get a full breath.  A little winded with activity.  Delene Loll was planned to be started.  Minimal fleeting chest discomfort.  Nature conservation officer  Denies any fever chills nausea vomiting syncope bleeding  Past Medical History:  Diagnosis Date  . Anemia    low iron  . BPH (benign prostatic hypertrophy)   . CAD (coronary artery disease), native coronary artery    a. LHC 07/2019: 90% mLAD s/p atherectomyDES, 75% pLCX, 40% LM, 25% pLAD, 50% 2nd Diag. LVEF 35-45. Normal LVEDP  . Dyspnea    "a little bit" with exertion  . Frequency of urination   . GERD (gastroesophageal reflux disease)    occasionally uses tums or 1 tbsp vinager  . History of bladder stone   . History of kidney stones   . Hypertension   . Ischemic cardiomyopathy     a. Echo 08/2019: LVEF 35-40%, global hypokinesis, G1DD  . Nocturia   . Pneumonia   . Status post insertion of drug-eluting stent into left anterior descending (LAD) artery for coronary artery disease 08/11/2019  . Wears glasses     Past Surgical History:   Procedure Laterality Date  . CORONARY ATHERECTOMY N/A 08/11/2019   Procedure: CORONARY ATHERECTOMY;  Surgeon: Jettie Booze, MD;  Location: [facility];  Service: Cardiovascular;  Laterality: N/A;  . CORONARY STENT INTERVENTION N/A 09/04/2019   Procedure: CORONARY STENT INTERVENTION;  Surgeon: Martinique, Peter M, MD;  Location: [place] [facility];  Service: Cardiovascular;  Laterality: N/A;  . CYSTO/  LEFT URETEROSCOPIC STONE EXTRACTION/  BLADDER  STONE EXTRACTION  02-15-2009  . CYSTOSCOPY WITH LITHOLAPAXY N/A 09/29/2013   Procedure: CYSTOSCOPY WITH LITHOLAPAXY;  Surgeon: Franchot Gallo, MD;  Location[facility];  Service: Urology;  Laterality: N/A;  . INTRAVASCULAR ULTRASOUND/IVUS N/A 08/11/2019   Procedure: Intravascular Ultrasound/IVUS;  Surgeon: Jettie Booze, MD;  Location: [place] [facility];  Service: Cardiovascular;  Laterality: N/A;  . LEFT HEART CATH AND CORONARY ANGIOGRAPHY N/A 08/11/2019   Procedure: LEFT HEART CATH AND CORONARY ANGIOGRAPHY;  Surgeon: Jettie Booze, MD;  Location: [place] [facility];  Service: Cardiovascular;  Laterality: N/A;  . LUMBAR [place] SURGERY  09-19-2000   LEFT  L4 -- L5  . REPAIR RIGHT HAND SOFT TISSUE STRUCTURES  02-24-2000   WORK INJURY /  PARTIAL AMPUTATION RIGHT INDEX FINGER  . TRANSURETHRAL RESECTION OF PROSTATE N/A 09/29/2013   Procedure: TRANSURETHRAL RESECTION OF THE PROSTATE WITH GYRUS INSTRUMENTS;  Surgeon: Franchot Gallo, MD;  Location: [facility];  Service: Urology;  Laterality: N/A;  . VIDEO BRONCHOSCOPY WITH ENDOBRONCHIAL ULTRASOUND N/A 05/16/2019   Procedure: VIDEO BRONCHOSCOPY WITH ENDOBRONCHIAL ULTRASOUND;  Surgeon: Garner Nash, DO;  Location: [facility];  Service: Thoracic;  Laterality: N/A;    Current Medications: Current Meds  Medication Sig  . acetaminophen (TYLENOL) 325 MG tablet Take 650 mg by mouth every 6 (six) hours as needed (FOR PAIN).  . Ascorbic Acid (VITAMIN C)  1000 MG tablet Take 1,000 mg by mouth daily.  Marland Kitchen aspirin EC 81 MG tablet Take 1 tablet (81 mg total) by mouth daily.  . Cholecalciferol (VITAMIN D3) 50 MCG (2000 UT) capsule Take 2,000 mg by mouth daily.   . clopidogrel (PLAVIX) 75 MG tablet Take 1 tablet (75 mg total) by mouth daily with breakfast.  . isosorbide mononitrate (IMDUR) 30 MG 24 hr tablet Take 1 tablet (30 mg total) by mouth daily.  . metoprolol succinate (TOPROL-XL) 50 MG 24 hr tablet Take 50 mg by mouth daily.  . nitroGLYCERIN (NITROSTAT) 0.4 MG SL tablet Place 1 tablet (0.4 mg total) under the tongue every 5 (five) minutes as needed for chest pain.  . pantoprazole (PROTONIX) 40 MG tablet Take 1 tablet (40 mg total) by mouth daily before breakfast.  . rosuvastatin (CRESTOR) 10 MG tablet Take 1 tablet (10 mg total) by mouth daily.     Allergies:   Patient has no known allergies.   Social History   Socioeconomic History  . Marital status: Married    Spouse name: Not on file  . Number of children: Not on file  . Years of education: Not on file  . Highest education level: Not on file  Occupational History  . 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 Financial resource strain: Not on file  . Food insecurity    Worry: Not on file    Inability: Not on file  . Transportation needs    Medical: Not on file    Non-medical: Not on file  Tobacco Use  . Smoking status: Former Smoker    Packs/day: 1.00    Years: 20.00    Pack years: 20.00    Types: Cigarettes    Quit date: 09/29/1983    Years since quitting: 35.9  . Smokeless tobacco: Never Used  Substance and Sexual Activity  . Alcohol use: Yes    Comment: rare  . Drug use: No  . Sexual activity: Not on file  Lifestyle  . Physical activity    Days per week: Not on file    Minutes per session: Not on file  . Stress: Not on file  Relationships  . Social Herbalist on phone: Not on file     Gets together: Not on file    Attends religious service: Not on file    Active member of club or organization: Not on file    Attends meetings of clubs or organizations: Not on file    Relationship status: Not on file  Other Topics Concern  . Not on file  Social History Narrative  . Not on file     Family History: The patient's family history includes Hypertension in his mother. Mother HTN  ROS:   Please see the history of present illness.     All other systems reviewed and are negative.  EKGs/Labs/Other Studies Reviewed:    The  following studies were reviewed today:  Cath last procedure 09/04/2019-LAD/circumflex staged procedure stent  Echo 08/2019-EF 35-40  EKG:  EKG is not ordered today.  09/04/2019-deep T wave inversions noted sinus rhythm  Recent Labs: 08/31/2019: ALT 15; BUN 15; Creatinine, Ser 0.93; Hemoglobin 14.0; Platelets 177; Potassium 3.9; Sodium 142  Recent Lipid Panel    Component Value Date/Time   CHOL 116 08/31/2019 0839   TRIG 103 08/31/2019 0839   HDL 46 08/31/2019 0839   CHOLHDL 2.5 08/31/2019 0839   LDLCALC 51 08/31/2019 0839    Physical Exam:    VS:  BP 130/70   Pulse 82   Ht 5\' 11"  (1.803 m)   Wt 208 lb (94.3 kg)   SpO2 98%   BMI 29.01 kg/m     Wt Readings from Last 3 Encounters:  09/12/19 208 lb (94.3 kg)  09/04/19 205 lb (93 kg)  08/21/19 209 lb 12.8 oz (95.2 kg)     GEN:  Well nourished, well developed in no acute distress HEENT: Normal NECK: No JVD; No carotid bruits LYMPHATICS: No lymphadenopathy CARDIAC: RRR, no murmurs, rubs, gallops RESPIRATORY:  Clear to auscultation without rales, wheezing or rhonchi  ABDOMEN: Soft, non-tender, non-distended MUSCULOSKELETAL:  No edema; No deformity  SKIN: Warm and dry NEUROLOGIC:  Alert and oriented x 3 PSYCHIATRIC:  Normal affect   ASSESSMENT:    1. Coronary artery disease involving native coronary artery of native heart without angina pectoris   2. Long-term use of high-risk  medication   3. Essential hypertension   4. Coronary artery calcification   5. Hyperlipidemia, unspecified hyperlipidemia type   6. Status post angioplasty with stent    PLAN:    In order of problems listed above:  Coronary artery disease -LAD and circumflex stents 09/04/2019-staged procedure -Dual antiplatelet therapy doing well.  Chronic systolic heart failure -NYHA class I-II, mild dyspnea. -Starting [facility] 24/26 checking basic metabolic profile in 2 weeks we will see him back in a month to increase the dose if possible.  Cost may be prohibitive.  If it is, we will place him on losartan 50 mg a day. -Does not need a loop diuretic.  Prior LVEDP 18  Hyperlipidemia -Crestor 10 mg, LDL 51 on 08/31/2019  Lung mass/hilar mass -Decreased in size.  Dr. Roxan Hockey monitoring.    Medication Adjustments/Labs and Tests Ordered: Current medicines are reviewed at length with the patient today.  Concerns regarding medicines are outlined above.  Orders Placed This Encounter  Procedures  . Basic Metabolic Panel (BMET)   Meds ordered this encounter  Medications  . sacubitril-valsartan (ENTRESTO) 24-26 MG    Sig: Take 1 tablet by mouth 2 (two) times daily.    Dispense:  60 tablet    Refill:  11    Patient Instructions  Medication Instructions:  Please start Entresto 24-26 mg one tablet twice a day. Continue all other medications as listed.  *If you need a refill on your cardiac medications before your next appointment, please call your pharmacy*  Lab Work: Please have blood work 2 weeks after starting [employer].  If you have labs (blood work) drawn today and your tests are completely normal, you will receive your results only by: Marland Kitchen MyChart Message (if you have MyChart) OR . A paper copy in the mail If you have any lab test that is abnormal or we need to change your treatment, we will call you to review the results.  Follow-Up: Keep previously scheduled appointment in  January 2021.  Thank you for choosing [facility]!!        Signed, Candee Furbish, MD  09/12/2019 11:30 AM    [place]

## 2019-09-27 ENCOUNTER — Telehealth: Payer: Self-pay | Admitting: *Deleted

## 2019-09-27 NOTE — Telephone Encounter (Signed)
I s/w Dr. Liliane Channel office and relayed information in regards to clearance request: Primary Cardiologist: Candee Furbish, MD  Chart reviewed as part of pre-operative protocol coverage.  Henry [name] has a history of recent PCI to proximal LCx x2 08/2019 in which he will need to be on uninterrupted DAPT with ASA and Plavix for a minimum of 6 months.  Therefore, colonoscopy will need to be postponed until at least 02/2020.  Preop team: -Please let the requesting office know of the above recommendation.  Please call with questions.  Kathyrn Drown, NP 09/27/2019, 11:32 AM  I will route clearance notes to Dr. Earlean Shawl. If any questions please call back and you may ask for Pre OP Provider today is Kathyrn Drown, NP.

## 2019-09-27 NOTE — Telephone Encounter (Signed)
   Primary Cardiologist: Candee Furbish, MD  Chart reviewed as part of pre-operative protocol coverage.  Henry [name] has a history of recent PCI to proximal LCx x2 08/2019 in which he will need to be on uninterrupted DAPT with ASA and Plavix for a minimum of 6 months.  Therefore, colonoscopy will need to be postponed until at least 02/2020.  Preop team: -Please let the requesting office know of the above recommendation.  Please call with questions.  Kathyrn Drown, NP 09/27/2019, 11:32 AM

## 2019-09-27 NOTE — Telephone Encounter (Signed)
   [facility] Pre-operative Risk Assessment    Request for surgical clearance:  1. What type of surgery is being performed? COLONOSCOPY   2. When is this surgery scheduled? 10/12/19   3. What type of clearance is required (medical clearance vs. Pharmacy clearance to hold med vs. Both)? MEDICAL  4. Are there any medications that need to be held prior to surgery and how long? PLAVIX    5. Practice name and name of physician performing surgery? [facility]; DR. JEFFREY MEDOFF   6. What is your office phone number [phone]    7.   What is your office fax number [phone]  8.   Anesthesia type (None, local, MAC, general) ? NOT LISTED   Julaine Hua 09/27/2019, 11:16 AM  _________________________________________________________________   (provider comments below)

## 2019-09-28 ENCOUNTER — Other Ambulatory Visit: Payer: Medicare Other | Admitting: *Deleted

## 2019-09-28 ENCOUNTER — Other Ambulatory Visit: Payer: Self-pay

## 2019-09-28 DIAGNOSIS — Z79899 Other long term (current) drug therapy: Secondary | ICD-10-CM

## 2019-09-28 DIAGNOSIS — I10 Essential (primary) hypertension: Secondary | ICD-10-CM

## 2019-09-28 LAB — BASIC METABOLIC PANEL
BUN/Creatinine Ratio: 14 (ref 10–24)
BUN: 14 mg/dL (ref 8–27)
CO2: 25 mmol/L (ref 20–29)
Calcium: 9.8 mg/dL (ref 8.6–10.2)
Chloride: 103 mmol/L (ref 96–106)
Creatinine, Ser: 0.98 mg/dL (ref 0.76–1.27)
GFR calc Af Amer: 91 mL/min/{1.73_m2} (ref >59)
GFR calc non Af Amer: 79 mL/min/{1.73_m2} (ref >59)
Glucose: 99 mg/dL (ref 65–99)
Potassium: 4.2 mmol/L (ref 3.5–5.2)
Sodium: 142 mmol/L (ref 134–144)

## 2019-10-03 ENCOUNTER — Telehealth: Payer: Self-pay | Admitting: *Deleted

## 2019-10-03 MED ORDER — LOSARTAN POTASSIUM 100 MG PO TABS: 100.0000 mg | ORAL_TABLET | Freq: Every day | ORAL | 3 refills | Status: DC

## 2019-10-03 NOTE — Telephone Encounter (Signed)
Henry Pain, MD  10/03/2019 10:28 AM EST    Lets stop the Entresto then, start losartan 100 mg once a day.   Shellia Cleverly, RN  09/29/2019 9:06 AM EST    Spoke with patient and reviewed labs with him. Pt reports he is feeling fine. He does not have a way to check his BP and doesn't know how its been running. He does mention his insurance will only cover about $200 a month for Entresto, leaving him with a balance of $450/month. He can not afford this and will need to be changed to something else. Will notify Dr Marlou Porch.   Patient is aware Dr Marlou Porch has given orders to change to Losartan when he runs out of the [facility].  Rx sent into [facility] x 3 as requested.

## 2019-10-16 ENCOUNTER — Other Ambulatory Visit: Payer: Self-pay

## 2019-10-16 ENCOUNTER — Encounter: Payer: Self-pay | Admitting: Cardiology

## 2019-10-16 ENCOUNTER — Ambulatory Visit (INDEPENDENT_AMBULATORY_CARE_PROVIDER_SITE_OTHER): Payer: Medicare Other | Admitting: Cardiology

## 2019-10-16 VITALS — BP 120/60 | HR 73 | Ht 71.0 in | Wt 204.0 lb

## 2019-10-16 DIAGNOSIS — I209 Angina pectoris, unspecified: Secondary | ICD-10-CM

## 2019-10-16 DIAGNOSIS — I5022 Chronic systolic (congestive) heart failure: Secondary | ICD-10-CM | POA: Diagnosis not present

## 2019-10-16 DIAGNOSIS — I2510 Atherosclerotic heart disease of native coronary artery without angina pectoris: Secondary | ICD-10-CM

## 2019-10-16 DIAGNOSIS — I10 Essential (primary) hypertension: Secondary | ICD-10-CM | POA: Diagnosis not present

## 2019-10-16 MED ORDER — FUROSEMIDE 20 MG PO TABS: 20.0000 mg | ORAL_TABLET | Freq: Every day | ORAL | 3 refills | Status: DC

## 2019-10-16 NOTE — Patient Instructions (Addendum)
Medication Instructions:  Please start Furosemide 20 mg daily.  Continue all other medications as listed.  *If you need a refill on your cardiac medications before your next appointment, please call your pharmacy*  Follow-Up: At [facility], you and your health needs are our priority.  As part of our continuing mission to provide you with exceptional heart care, we have created designated Provider Care Teams.  These Care Teams include your primary Cardiologist (physician) and Advanced Practice Providers (APPs -  Physician Assistants and Nurse Practitioners) who all work together to provide you with the care you need, when you need it.  Your next appointment:   3 month(s)  The format for your next appointment:   In Person  Provider:   Kathyrn Drown, NP  and 6 months with Dr Marlou Porch.  Thank you for choosing [facility]!!    To schedule an appointment for COVID testing at the [facility] location text [place] to [phone] or go to [email or web address].

## 2019-10-16 NOTE — Progress Notes (Signed)
Cardiology Office Note:    Date:  10/16/2019   ID:  Henry [name], DOB 1951-10-10, MRN [id number]  PCP:  [name]  Cardiologist:  Henry Furbish, [name]  Electrophysiologist:  None   Referring [name]: Henry [name], Henry [name]*     History of Present Illness:    Henry [name] is a 69 y.o. male with coronary artery disease status post staged PCI here for follow-up.  Had LAD stent placed as well as circumflex stent.  Synergy.  He was originally seen on 08/08/2019 for dyspnea at the request of Dr. Roxan [name] and Henry Quaker, NP with pulmonary.  Right hilar mass.  This was decreasing in size.  CT scan showed dense coronary calcifications, catheterization was performed which revealed critical LAD and circumflex disease.  EF was also low at 35 to 40%.  Overall doing well but no significant worsening.  Still having the sensation like he needs to take in a deep breath 2 or 3 times to get a full breath.  A little winded with activity.  Henry [name] was planned to be started.  Minimal fleeting chest discomfort.  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 100 and following.  EF 35%.  He has been feeling some minor chest discomfort at times has been having some cough chest congestion over the past few days, denies fevers. Has not been able to afford Entresto.     Past Medical History:  Diagnosis Date  . Anemia    low iron  . BPH (benign prostatic hypertrophy)   . CAD (coronary artery disease), native coronary artery    a. LHC 07/2019: 90% mLAD s/p atherectomyDES, 75% pLCX, 40% LM, 25% pLAD, 50% 2nd Diag. LVEF 35-45. Normal LVEDP  . Dyspnea    "a little bit" with exertion  . Frequency of urination   . GERD (gastroesophageal reflux disease)    occasionally uses tums or 1 tbsp vinager  . History of bladder stone   . History of kidney stones   . Hypertension   . Ischemic cardiomyopathy     a. Echo 08/2019: LVEF 35-40%,  global hypokinesis, G1DD  . Nocturia   . Pneumonia   . Status post insertion of drug-eluting stent into left anterior descending (LAD) artery for coronary artery disease 08/11/2019  . Wears glasses     Past Surgical History:  Procedure Laterality Date  . CORONARY ATHERECTOMY N/A 08/11/2019   Procedure: CORONARY ATHERECTOMY;  Surgeon: Henry [name];  Location: [place] [facility];  Service: Cardiovascular;  Laterality: N/A;  . CORONARY STENT INTERVENTION N/A 09/04/2019   Procedure: CORONARY STENT INTERVENTION;  Surgeon: Henry [name];  Location: [place] [facility];  Service: Cardiovascular;  Laterality: N/A;  . CYSTO/  LEFT URETEROSCOPIC STONE EXTRACTION/  BLADDER  STONE EXTRACTION  02-15-2009  . CYSTOSCOPY WITH LITHOLAPAXY N/A 09/29/2013   Procedure: CYSTOSCOPY WITH LITHOLAPAXY;  Surgeon: Henry Gallo, [name];  Location: [facility];  Service: Urology;  Laterality: N/A;  . INTRAVASCULAR ULTRASOUND/IVUS N/A 08/11/2019   Procedure: Intravascular Ultrasound/IVUS;  Surgeon: Henry [name];  Location: [place] [facility];  Service: Cardiovascular;  Laterality: N/A;  . LEFT HEART CATH AND CORONARY ANGIOGRAPHY N/A 08/11/2019   Procedure: LEFT HEART CATH AND CORONARY ANGIOGRAPHY;  Surgeon: Henry [name];  Location: [place] [facility];  Service: Cardiovascular;  Laterality: N/A;  . LUMBAR [place] SURGERY  09-19-2000   LEFT  L4 -- L5  .  REPAIR RIGHT HAND SOFT TISSUE STRUCTURES  02-24-2000   WORK INJURY /  PARTIAL AMPUTATION RIGHT INDEX FINGER  . TRANSURETHRAL RESECTION OF PROSTATE N/A 09/29/2013   Procedure: TRANSURETHRAL RESECTION OF THE PROSTATE WITH GYRUS INSTRUMENTS;  Surgeon: Henry Gallo, [name];  Location: [facility];  Service: Urology;  Laterality: N/A;  . VIDEO BRONCHOSCOPY WITH ENDOBRONCHIAL ULTRASOUND N/A 05/16/2019   Procedure: VIDEO BRONCHOSCOPY WITH ENDOBRONCHIAL ULTRASOUND;  Surgeon: Henry Nash, DO;  Location: [facility];  Service: Thoracic;  Laterality: N/A;    Current Medications: Current Meds  Medication Sig  . acetaminophen (TYLENOL) 325 MG tablet Take 650 mg by mouth every 6 (six) hours as needed (FOR PAIN).  . Ascorbic Acid (VITAMIN C) 1000 MG tablet Take 1,000 mg by mouth daily.  Marland Kitchen aspirin EC 81 MG tablet Take 1 tablet (81 mg total) by mouth daily.  . clopidogrel (PLAVIX) 75 MG tablet Take 1 tablet (75 mg total) by mouth daily with breakfast.  . losartan (COZAAR) 100 MG tablet Take 1 tablet (100 mg total) by mouth daily.  . metoprolol succinate (TOPROL-XL) 50 MG 24 hr tablet Take 50 mg by mouth daily.  . nitroGLYCERIN (NITROSTAT) 0.4 MG SL tablet Place 1 tablet (0.4 mg total) under the tongue every 5 (five) minutes as needed for chest pain.  . pantoprazole (PROTONIX) 40 MG tablet Take 1 tablet (40 mg total) by mouth daily before breakfast.  . rosuvastatin (CRESTOR) 10 MG tablet Take 1 tablet (10 mg total) by mouth daily.     Allergies:   Patient has no known allergies.   Social History   Socioeconomic History  . Marital status: Married    Spouse name: Not on file  . Number of children: Not on file  . Years of education: Not on file  . Highest education level: Not on file  Occupational History  . 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 Smoking status: Former Smoker    Packs/day: 1.00    Years: 20.00    Pack years: 20.00    Types: Cigarettes    Quit date: 09/29/1983    Years since quitting: 36.0  . Smokeless tobacco: Never Used  Substance and Sexual Activity  . Alcohol use: Yes    Comment: rare  . Drug use: No  . Sexual activity: Not on file  Other Topics Concern  . Not on file  Social History Narrative  . Not on file   Social Determinants of Health   Financial Resource Strain:   . Difficulty of Paying Living Expenses: Not on file  Food Insecurity:   . Worried About Charity fundraiser in the Last Year:  Not on file  . Ran Out of Food in the Last Year: Not on file  Transportation Needs:   . Lack of Transportation (Medical): Not on file  . Lack of Transportation (Non-Medical): Not on file  Physical Activity:   . Days of Exercise per Week: Not on file  . Minutes of Exercise per Session: Not on file  Stress:   . Feeling of Stress : Not on file  Social Connections:   . Frequency of Communication with Friends and Family: Not on file  . Frequency of Social Gatherings with Friends and Family: Not on file  . Attends Religious Services: Not on file  . Active Member of Clubs or Organizations: Not on file  . Attends Club  or Organization Meetings: Not on file  . Marital Status: Not on file     Family History: The patient's family history includes Hypertension in his mother. Mother HTN  ROS:   Please see the history of present illness.     All other systems reviewed and are negative.  EKGs/Labs/Other Studies Reviewed:    The following studies were reviewed today:  Cath last procedure 09/04/2019-LAD/circumflex staged procedure stent  Echo 08/2019-EF 35-40  EKG:  EKG is not ordered today.  09/04/2019-deep T wave inversions noted sinus rhythm  Recent Labs: 08/31/2019: ALT 15; Hemoglobin 14.0; Platelets 177 09/28/2019: BUN 14; Creatinine, Ser 0.98; Potassium 4.2; Sodium 142  Recent Lipid Panel    Component Value Date/Time   CHOL 116 08/31/2019 0839   TRIG 103 08/31/2019 0839   HDL 46 08/31/2019 0839   CHOLHDL 2.5 08/31/2019 0839   LDLCALC 51 08/31/2019 0839    Physical Exam:    VS:  BP 120/60   Pulse 73   Ht 5\' 11"  (1.803 m)   Wt 204 lb (92.5 kg)   SpO2 98%   BMI 28.45 kg/m     Wt Readings from Last 3 Encounters:  10/16/19 204 lb (92.5 kg)  09/12/19 208 lb (94.3 kg)  09/04/19 205 lb (93 kg)     GEN: Well nourished, well developed, in no acute distress  HEENT: normal  Neck: no JVD, carotid bruits, or masses Cardiac: RRR; no murmurs, rubs, or gallops,no edema   Respiratory:  clear to auscultation bilaterally, normal work of breathing GI: soft, nontender, nondistended, + BS MS: no deformity or atrophy  Skin: warm and dry, no rash Neuro:  Alert and Oriented x 3, Strength and sensation are intact Psych: euthymic mood, full affect   ASSESSMENT:    1. Coronary artery disease involving native coronary artery of native heart without angina pectoris   2. Essential hypertension   3. Angina pectoris ([place])   4. Chronic systolic heart failure (HCC)    PLAN:    In order of problems listed above:  Coronary artery disease -LAD and circumflex stents 09/04/2019-staged procedure -Dual antiplatelet therapy doing well. -Optimal to continue Plavix and aspirin for 1 year post stent placement.  We will try to delay any elective procedures until then.  If colonoscopy is necessary, could proceed 6 months after stent placement.  Chronic systolic heart failure -NYHA class I-II, mild dyspnea. -Cannot afford Entresto, we will go ahead and place him on losartan.  He is not currently on a loop diuretic.  Prior LVEDP 18.  We will give him Lasix 20 mg once a day to take since he is not going to be on the [place].  He is having some reservations taking his medications.  We will check a basic metabolic profile at next visit.  He is at moderate risk for morbidity based upon his clinical condition  Hyperlipidemia -Crestor 10 mg, LDL 51 on 08/31/2019, continue.  No myalgias.  Lung mass/hilar mass -Decreased in size.  Dr. Roxan [name] monitoring.  No changes.  I recommended with his chest congestion and cough that he seek out Covid testing.  Instructions given.  In 3 months we will have him come back in and see Sharee Pimple, me in 6.   Medication Adjustments/Labs and Tests Ordered: Current medicines are reviewed at length with the patient today.  Concerns regarding medicines are outlined above.  No orders of the defined types were placed in this encounter.  Meds ordered this  encounter  Medications  .  furosemide (LASIX) 20 MG tablet    Sig: Take 1 tablet (20 mg total) by mouth daily.    Dispense:  90 tablet    Refill:  3    Patient Instructions  Medication Instructions:  Please start Furosemide 20 mg daily.  Continue all other medications as listed.  *If you need a refill on your cardiac medications before your next appointment, please call your pharmacy*  Follow-Up: At [facility], you and your health needs are our priority.  As part of our continuing mission to provide you with exceptional heart care, we have created designated Provider Care Teams.  These Care Teams include your primary Cardiologist (physician) and Advanced Practice Providers (APPs -  Physician Assistants and Nurse Practitioners) who all work together to provide you with the care you need, when you need it.  Your next appointment:   3 month(s)  The format for your next appointment:   In Person  Provider:   Kathyrn Drown, NP  and 6 months with Dr Marlou Porch.  Thank you for choosing [facility]!!    To schedule an appointment for COVID testing at the [facility] location text [place] to [phone] or go to [email or web address].     Signed, Henry Furbish, [name]  10/16/2019 9:48 AM    [facility]

## 2019-10-17 ENCOUNTER — Ambulatory Visit: Payer: Medicare Other | Attending: Internal Medicine

## 2019-10-17 DIAGNOSIS — Z20822 Contact with and (suspected) exposure to covid-19: Secondary | ICD-10-CM

## 2019-10-19 LAB — NOVEL CORONAVIRUS, NAA: SARS-CoV-2, NAA: DETECTED — AB

## 2019-12-28 ENCOUNTER — Encounter: Payer: Self-pay | Admitting: Cardiology

## 2019-12-28 ENCOUNTER — Other Ambulatory Visit: Payer: Self-pay

## 2019-12-28 ENCOUNTER — Ambulatory Visit (INDEPENDENT_AMBULATORY_CARE_PROVIDER_SITE_OTHER): Payer: Medicare Other | Admitting: Cardiology

## 2019-12-28 VITALS — BP 124/70 | HR 75 | Ht 71.0 in | Wt 209.0 lb

## 2019-12-28 DIAGNOSIS — I209 Angina pectoris, unspecified: Secondary | ICD-10-CM | POA: Diagnosis not present

## 2019-12-28 DIAGNOSIS — I5022 Chronic systolic (congestive) heart failure: Secondary | ICD-10-CM

## 2019-12-28 DIAGNOSIS — R310 Gross hematuria: Secondary | ICD-10-CM | POA: Diagnosis not present

## 2019-12-28 DIAGNOSIS — I2510 Atherosclerotic heart disease of native coronary artery without angina pectoris: Secondary | ICD-10-CM | POA: Diagnosis not present

## 2019-12-28 NOTE — Progress Notes (Signed)
Cardiology Office Note:    Date:  12/28/2019   ID:  Henry [name], DOB 1951-04-01, MRN [id number]  PCP:  [facility]  Cardiologist:  Candee Furbish, MD  Electrophysiologist:  None   Referring MD: Pa, [facility]*     History of Present Illness:    Henry [name] is a 69 y.o. male with cardiomyopathy, chronic systolic heart failure-tried Entresto but too expensive.  Losartan 100 mg a day utilized and switched in December 2020.  Has coronary disease status post staged PCI.  LAD stent as well as circumflex.  Synergy type stent.  Back in October 2020 he was seen at the request of Dr. Roxan Hockey.  Had a right hilar mass that was decreasing in size.  CT scan showed dense calcification so a cardiac catheterization was performed which revealed critical LAD disease and circumflex disease.  EF was 35 to 40%.  Works in Architect.  Past Medical History:  Diagnosis Date  . Anemia    low iron  . BPH (benign prostatic hypertrophy)   . CAD (coronary artery disease), native coronary artery    a. LHC 07/2019: 90% mLAD s/p atherectomyDES, 75% pLCX, 40% LM, 25% pLAD, 50% 2nd Diag. LVEF 35-45. Normal LVEDP  . Dyspnea    "a little bit" with exertion  . Frequency of urination   . GERD (gastroesophageal reflux disease)    occasionally uses tums or 1 tbsp vinager  . History of bladder stone   . History of kidney stones   . Hypertension   . Ischemic cardiomyopathy     a. Echo 08/2019: LVEF 35-40%, global hypokinesis, G1DD  . Nocturia   . Pneumonia   . Status post insertion of drug-eluting stent into left anterior descending (LAD) artery for coronary artery disease 08/11/2019  . Wears glasses     Past Surgical History:  Procedure Laterality Date  . CORONARY ATHERECTOMY N/A 08/11/2019   Procedure: CORONARY ATHERECTOMY;  Surgeon: Jettie Booze, MD;  Location: [place] [facility];  Service: Cardiovascular;  Laterality: N/A;  . CORONARY STENT INTERVENTION N/A  09/04/2019   Procedure: CORONARY STENT INTERVENTION;  Surgeon: Martinique, Peter M, MD;  Location: [place] [facility];  Service: Cardiovascular;  Laterality: N/A;  . CYSTO/  LEFT URETEROSCOPIC STONE EXTRACTION/  BLADDER  STONE EXTRACTION  02-15-2009  . CYSTOSCOPY WITH LITHOLAPAXY N/A 09/29/2013   Procedure: CYSTOSCOPY WITH LITHOLAPAXY;  Surgeon: Franchot Gallo, MD;  Location[facility];  Service: Urology;  Laterality: N/A;  . INTRAVASCULAR ULTRASOUND/IVUS N/A 08/11/2019   Procedure: Intravascular Ultrasound/IVUS;  Surgeon: Jettie Booze, MD;  Location: [place] [facility];  Service: Cardiovascular;  Laterality: N/A;  . LEFT HEART CATH AND CORONARY ANGIOGRAPHY N/A 08/11/2019   Procedure: LEFT HEART CATH AND CORONARY ANGIOGRAPHY;  Surgeon: Jettie Booze, MD;  Location: [place] [facility];  Service: Cardiovascular;  Laterality: N/A;  . LUMBAR [place] SURGERY  09-19-2000   LEFT  L4 -- L5  . REPAIR RIGHT HAND SOFT TISSUE STRUCTURES  02-24-2000   WORK INJURY /  PARTIAL AMPUTATION RIGHT INDEX FINGER  . TRANSURETHRAL RESECTION OF PROSTATE N/A 09/29/2013   Procedure: TRANSURETHRAL RESECTION OF THE PROSTATE WITH GYRUS INSTRUMENTS;  Surgeon: Franchot Gallo, MD;  [facility];  Service: Urology;  Laterality: N/A;  . VIDEO BRONCHOSCOPY WITH ENDOBRONCHIAL ULTRASOUND N/A 05/16/2019   Procedure: VIDEO BRONCHOSCOPY WITH ENDOBRONCHIAL ULTRASOUND;  Surgeon: Garner Nash, DO;  Location: [place];  Service: Thoracic;  Laterality: N/A;  Current Medications: Current Meds  Medication Sig  . acetaminophen (TYLENOL) 325 MG tablet Take 650 mg by mouth every 6 (six) hours as needed (FOR PAIN).  . Ascorbic Acid (VITAMIN C) 1000 MG tablet Take 1,000 mg by mouth daily.  Marland Kitchen aspirin EC 81 MG tablet Take 1 tablet (81 mg total) by mouth daily.  . clopidogrel (PLAVIX) 75 MG tablet Take 1 tablet (75 mg total) by mouth daily with breakfast.  . furosemide (LASIX) 20 MG tablet  Take 1 tablet (20 mg total) by mouth daily.  Marland Kitchen losartan (COZAAR) 100 MG tablet Take 1 tablet (100 mg total) by mouth daily.  . metoprolol succinate (TOPROL-XL) 50 MG 24 hr tablet Take 50 mg by mouth daily.  . nitroGLYCERIN (NITROSTAT) 0.4 MG SL tablet Place 1 tablet (0.4 mg total) under the tongue every 5 (five) minutes as needed for chest pain.  . pantoprazole (PROTONIX) 40 MG tablet Take 1 tablet (40 mg total) by mouth daily before breakfast.  . rosuvastatin (CRESTOR) 10 MG tablet Take 1 tablet (10 mg total) by mouth daily.     Allergies:   Patient has no known allergies.   Social History   Socioeconomic History  . Marital status: Married    Spouse name: Not on file  . Number of children: Not on file  . Years of education: Not on file  . Highest education level: Not on file  Occupational History  . 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 Smoking status: Former Smoker    Packs/day: 1.00    Years: 20.00    Pack years: 20.00    Types: Cigarettes    Quit date: 09/29/1983    Years since quitting: 36.2  . Smokeless tobacco: Never Used  Substance and Sexual Activity  . Alcohol use: Yes    Comment: rare  . Drug use: No  . Sexual activity: Not on file  Other Topics Concern  . Not on file  Social History Narrative  . Not on file   Social Determinants of Health   Financial Resource Strain:   . Difficulty of Paying Living Expenses:   Food Insecurity:   . Worried About Charity fundraiser in the Last Year:   . Arboriculturist in the Last Year:   Transportation Needs:   . Film/video editor (Medical):   Marland Kitchen Lack of Transportation (Non-Medical):   Physical Activity:   . Days of Exercise per Week:   . Minutes of Exercise per Session:   Stress:   . Feeling of Stress :   Social Connections:   . Frequency of Communication with Friends and Family:   . Frequency of Social Gatherings with Friends and Family:    . Attends Religious Services:   . Active Member of Clubs or Organizations:   . Attends Archivist Meetings:   Marland Kitchen Marital Status:      Family History: The patient's family history includes Hypertension in his mother.  ROS:   Please see the history of present illness.     All other systems reviewed and are negative.  EKGs/Labs/Other Studies Reviewed:    The following studies were reviewed today:  Cardiac catheterization 09/04/2019-LAD and circumflex staged procedure.  Echo 08/2019-EF 35 to 40%    EKG:  prior EKG showed deep T wave inversions sinus rhythm.  Recent Labs: 08/31/2019: ALT 15; Hemoglobin 14.0; Platelets 177 09/28/2019: BUN  14; Creatinine, Ser 0.98; Potassium 4.2; Sodium 142  Recent Lipid Panel    Component Value Date/Time   CHOL 116 08/31/2019 0839   TRIG 103 08/31/2019 0839   HDL 46 08/31/2019 0839   CHOLHDL 2.5 08/31/2019 0839   LDLCALC 51 08/31/2019 0839    Physical Exam:    VS:  BP 124/70   Pulse 75   Ht 5\' 11"  (1.803 m)   Wt 209 lb (94.8 kg)   SpO2 97%   BMI 29.15 kg/m     Wt Readings from Last 3 Encounters:  12/28/19 209 lb (94.8 kg)  10/16/19 204 lb (92.5 kg)  09/12/19 208 lb (94.3 kg)     GEN:  Well nourished, well developed in no acute distress HEENT: Normal NECK: No JVD; No carotid bruits LYMPHATICS: No lymphadenopathy CARDIAC: RRR, no murmurs, rubs, gallops RESPIRATORY:  Clear to auscultation without rales, wheezing or rhonchi  ABDOMEN: Soft, non-tender, non-distended MUSCULOSKELETAL:  No edema; No deformity  SKIN: Warm and dry NEUROLOGIC:  Alert and oriented x 3 PSYCHIATRIC:  Normal affect   ASSESSMENT:    1. Coronary artery disease involving native coronary artery of native heart without angina pectoris   2. Chronic systolic heart failure ([place])   3. Gross hematuria   4. Angina pectoris (HCC)    PLAN:    In order of problems listed above:  CAD -LAD and circumflex stents in November 2020, staged procedure,  dual antiplatelet therapy, doing well.  Minimum dual antiplatelet [place] for 6 months.  After that we could utilize Plavix monotherapy or aspirin monotherapy.  Angina  - rare NTG use.  This seems to help.  Rare chest pressure.  COVID at Christmas  - took it easy. Tired since then.  Has noted some mild shortness of breath with activity.  Hematuria Urology.   - noted after heavy yard work. Slight burning at first, second day. After clearing burning stopped.  No change in stream.  Back when he had kidney stones several years ago, he had hematuria.  He has not had any back discomfort.  -We will go ahead and have him see Dr. Franchot Gallo his urologist.  Chronic systolic heart failure -Class I-II symptoms -Could not afford Entresto.  Therefore have changed him to losartan 100. -Utilizing low-dose furosemide.  Recheck echocardiogram.  Hyperlipidemia -Crestor 10 mg LDL 51 previously.  Excellent.  Lung mass -Dr. Roxan Hockey.  Decreasing in size.  Monitoring.   Medication Adjustments/Labs and Tests Ordered: Current medicines are reviewed at length with the patient today.  Concerns regarding medicines are outlined above.  Orders Placed This Encounter  Procedures  . Ambulatory referral to Urology  . ECHOCARDIOGRAM COMPLETE   No orders of the defined types were placed in this encounter.   Patient Instructions  Medication Instructions:  The current medical regimen is effective;  continue present plan and medications.  *If you need a refill on your cardiac medications before your next appointment, please call your pharmacy*  You have been referred to Urology - Dr Franchot Gallo.  Testing/Procedures: Your physician has requested that you have an echocardiogram. Echocardiography is a painless test that uses sound waves to create images of your heart. It provides your doctor with information about the size and shape of your heart and how well your heart's chambers and valves are  working. This procedure takes approximately one hour. There are no restrictions for this procedure.  Follow-Up: At [facility], you and your health needs are our priority.  As part  of our continuing mission to provide you with exceptional heart care, we have created designated Provider Care Teams.  These Care Teams include your primary Cardiologist (physician) and Advanced Practice Providers (APPs -  Physician Assistants and Nurse Practitioners) who all work together to provide you with the care you need, when you need it.  We recommend signing up for the patient portal called "MyChart".  Sign up information is provided on this After Visit Summary.  MyChart is used to connect with patients for Virtual Visits (Telemedicine).  Patients are able to view lab/test results, encounter notes, upcoming appointments, etc.  Non-urgent messages can be sent to your provider as well.   To learn more about what you can do with MyChart, go to [email or web address].    Your next appointment:   6 month(s)  The format for your next appointment:   In Person  Provider:   Candee Furbish, MD   Thank you for choosing [facility]!!         Signed, Candee Furbish, MD  12/28/2019 4:06 PM    [place]

## 2019-12-28 NOTE — Patient Instructions (Addendum)
Medication Instructions:  The current medical regimen is effective;  continue present plan and medications.  *If you need a refill on your cardiac medications before your next appointment, please call your pharmacy*  You have been referred to Urology - Dr Franchot Gallo.  Testing/Procedures: Your physician has requested that you have an echocardiogram. Echocardiography is a painless test that uses sound waves to create images of your heart. It provides your doctor with information about the size and shape of your heart and how well your heart's chambers and valves are working. This procedure takes approximately one hour. There are no restrictions for this procedure.  Follow-Up: At [facility], you and your health needs are our priority.  As part of our continuing mission to provide you with exceptional heart care, we have created designated Provider Care Teams.  These Care Teams include your primary Cardiologist (physician) and Advanced Practice Providers (APPs -  Physician Assistants and Nurse Practitioners) who all work together to provide you with the care you need, when you need it.  We recommend signing up for the patient portal called "MyChart".  Sign up information is provided on this After Visit Summary.  MyChart is used to connect with patients for Virtual Visits (Telemedicine).  Patients are able to view lab/test results, encounter notes, upcoming appointments, etc.  Non-urgent messages can be sent to your provider as well.   To learn more about what you can do with MyChart, go to [email or web address].    Your next appointment:   6 month(s)  The format for your next appointment:   In Person  Provider:   Candee Furbish, MD   Thank you for choosing [facility]!!

## 2020-01-16 ENCOUNTER — Encounter (INDEPENDENT_AMBULATORY_CARE_PROVIDER_SITE_OTHER): Payer: Self-pay

## 2020-01-16 ENCOUNTER — Other Ambulatory Visit: Payer: Self-pay

## 2020-01-16 ENCOUNTER — Ambulatory Visit (HOSPITAL_COMMUNITY): Payer: Medicare Other | Attending: Cardiovascular Disease

## 2020-01-16 DIAGNOSIS — I5022 Chronic systolic (congestive) heart failure: Secondary | ICD-10-CM | POA: Diagnosis present

## 2020-01-17 ENCOUNTER — Telehealth: Payer: Self-pay

## 2020-01-17 NOTE — Telephone Encounter (Signed)
-----   Message from Jerline Pain, MD sent at 01/16/2020  8:27 PM EDT ----- EF improved to 45% Moderate aortic regurgitation Mild mitral regurgitation.  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  Candee Furbish, MD

## 2020-01-17 NOTE — Telephone Encounter (Signed)
[id number] 4/7

## 2020-01-17 NOTE — Telephone Encounter (Signed)
The patient has been notified of the Echo result and verbalized understanding.  All questions (if any) were answered. Frederik Schmidt, RN 01/17/2020 10:30 AM

## 2020-02-06 ENCOUNTER — Other Ambulatory Visit: Payer: Self-pay | Admitting: Interventional Cardiology

## 2020-04-03 ENCOUNTER — Ambulatory Visit
Admission: RE | Admit: 2020-04-03 | Discharge: 2020-04-03 | Disposition: A | Discharge status: Home / Self Care | Payer: Medicare Other | Source: Ambulatory Visit | Attending: Pulmonary Disease | Admitting: Pulmonary Disease

## 2020-04-03 DIAGNOSIS — J849 Interstitial pulmonary disease, unspecified: Secondary | ICD-10-CM

## 2020-04-10 ENCOUNTER — Telehealth: Payer: Self-pay | Admitting: Pulmonary Disease

## 2020-04-10 NOTE — Telephone Encounter (Signed)
Unable to reach Cathay at [phone], called wife Katharine Look. Requested she let Mr. Sustaita know to call us back for his imaging results.

## 2020-04-11 NOTE — Progress Notes (Signed)
Spoke with the pt  He was informed of results already I went over these again with him and he verbalized understanding Reminder for appt with Icard was placed

## 2020-04-11 NOTE — Telephone Encounter (Signed)
Henry Rinne, NP  04/04/2020 8:48 AM EDT     Patient's high-resolution CT chest results of come back still showing pattern of fibrosis. This is the scarring on his lungs. This is stable from November/2020 CT chest. This is likely due to previous work exposures.  I will discuss this with Dr. Valeta Harms.  The right perihilar nodule which was previously been followed is no longer seen. This is good news.  Can we get the patient set up for a follow-up with Dr. Valeta Harms in 6 to 8 weeks to further review.  [name], FNP  ------------------------------------------------------------------------------------------ Spoke with pt. He is aware of results. Recall has been placed for ROV with Icard. Nothing further was needed.

## 2020-05-21 ENCOUNTER — Other Ambulatory Visit: Payer: Self-pay | Admitting: Family Medicine

## 2020-05-21 DIAGNOSIS — R229 Localized swelling, mass and lump, unspecified: Secondary | ICD-10-CM

## 2020-05-27 ENCOUNTER — Ambulatory Visit
Admission: RE | Admit: 2020-05-27 | Discharge: 2020-05-27 | Disposition: A | Discharge status: Home / Self Care | Payer: Medicare Other | Source: Ambulatory Visit | Attending: Family Medicine | Admitting: Family Medicine

## 2020-05-27 DIAGNOSIS — R229 Localized swelling, mass and lump, unspecified: Secondary | ICD-10-CM

## 2020-07-01 ENCOUNTER — Ambulatory Visit (INDEPENDENT_AMBULATORY_CARE_PROVIDER_SITE_OTHER): Payer: Medicare Other | Admitting: Cardiology

## 2020-07-01 ENCOUNTER — Other Ambulatory Visit: Payer: Self-pay

## 2020-07-01 ENCOUNTER — Encounter: Payer: Self-pay | Admitting: Cardiology

## 2020-07-01 VITALS — BP 128/70 | HR 69 | Ht 71.0 in | Wt 212.0 lb

## 2020-07-01 DIAGNOSIS — I209 Angina pectoris, unspecified: Secondary | ICD-10-CM | POA: Diagnosis not present

## 2020-07-01 DIAGNOSIS — I5022 Chronic systolic (congestive) heart failure: Secondary | ICD-10-CM | POA: Diagnosis not present

## 2020-07-01 DIAGNOSIS — I2510 Atherosclerotic heart disease of native coronary artery without angina pectoris: Secondary | ICD-10-CM | POA: Diagnosis not present

## 2020-07-01 NOTE — Progress Notes (Signed)
Cardiology Office Note:    Date:  07/01/2020   ID:  Henry [name], DOB 12-Apr-1951, MRN [id number]  PCP:  [facility]  [facility] Cardiologist:  Candee Furbish, MD  [place] Electrophysiologist:  None   Referring MD: Pa, [facility]*     History of Present Illness:    Henry [name] is a 69 y.o. male here for the follow-up of chronic systolic heart failure.  Entresto at one point was too expensive.  $400 a month  LAD as well as circumflex stent.  Prior right hilar mass decreased in size.  Dr. Ouida Sills saw.  High-resolution CT scan showed pulmonary fibrosis type pattern  Still feeling some shortness of breath with activity such as riding his bike.  He is pressure through it.  Does have numbness in his left arm when he holds his arm up while driving his car or riding his bike.  Been feeling some chest pain at times when riding his bike as well.  Discussed the possibility of utilizing Imdur.  He will let us know if any symptoms worsen especially rest symptoms.  Lab work shows LDL 51 creatinine 0.9.  Prior pulmonary notes reviewed from [name].  Past Medical History:  Diagnosis Date   Anemia    low iron   BPH (benign prostatic hypertrophy)    CAD (coronary artery disease), native coronary artery    a. LHC 07/2019: 90% mLAD s/p atherectomyDES, 75% pLCX, 40% LM, 25% pLAD, 50% 2nd Diag. LVEF 35-45. Normal LVEDP   Dyspnea    "a little bit" with exertion   Frequency of urination    GERD (gastroesophageal reflux disease)    occasionally uses tums or 1 tbsp vinager   History of bladder stone    History of kidney stones    Hypertension    Ischemic cardiomyopathy     a. Echo 08/2019: LVEF 35-40%, global hypokinesis, G1DD   Nocturia    Pneumonia    Status post insertion of drug-eluting stent into left anterior descending (LAD) artery for coronary artery disease 08/11/2019   Wears glasses     Past Surgical History:  Procedure  Laterality Date   CORONARY ATHERECTOMY N/A 08/11/2019   Procedure: CORONARY ATHERECTOMY;  Surgeon: Jettie Booze, MD;  Location: [place] [facility];  Service: Cardiovascular;  Laterality: N/A;   CORONARY STENT INTERVENTION N/A 09/04/2019   Procedure: CORONARY STENT INTERVENTION;  Surgeon: Martinique, Peter M, MD;  Location: [place] [facility];  Service: Cardiovascular;  Laterality: N/A;   CYSTO/  LEFT URETEROSCOPIC STONE EXTRACTION/  BLADDER  STONE EXTRACTION  02-15-2009   CYSTOSCOPY WITH LITHOLAPAXY N/A 09/29/2013   Procedure: CYSTOSCOPY WITH LITHOLAPAXY;  Surgeon: Franchot Gallo, MD;  Location: [facility];  Service: Urology;  Laterality: N/A;   INTRAVASCULAR ULTRASOUND/IVUS N/A 08/11/2019   Procedure: Intravascular Ultrasound/IVUS;  Surgeon: Jettie Booze, MD;  Location: [place] [facility];  Service: Cardiovascular;  Laterality: N/A;   LEFT HEART CATH AND CORONARY ANGIOGRAPHY N/A 08/11/2019   Procedure: LEFT HEART CATH AND CORONARY ANGIOGRAPHY;  Surgeon: Jettie Booze, MD;  Location: [place] [facility];  Service: Cardiovascular;  Laterality: N/A;   LUMBAR DISC SURGERY  09-19-2000   LEFT  L4 -- L5   REPAIR RIGHT HAND SOFT TISSUE STRUCTURES  02-24-2000   WORK INJURY /  PARTIAL AMPUTATION RIGHT INDEX FINGER   TRANSURETHRAL RESECTION OF PROSTATE N/A 09/29/2013   Procedure: TRANSURETHRAL RESECTION OF THE PROSTATE WITH GYRUS INSTRUMENTS;  Surgeon:  Franchot Gallo, MD;  Location: [facility];  Service: Urology;  Laterality: N/A;   VIDEO BRONCHOSCOPY WITH ENDOBRONCHIAL ULTRASOUND N/A 05/16/2019   Procedure: VIDEO BRONCHOSCOPY WITH ENDOBRONCHIAL ULTRASOUND;  Surgeon: Garner Nash, DO;  [facility];  Service: Thoracic;  Laterality: N/A;    Current Medications: Current Meds  Medication Sig   acetaminophen (TYLENOL) 325 MG tablet Take 650 mg by mouth every 6 (six) hours as needed (FOR PAIN).   Ascorbic Acid (VITAMIN C) 1000 MG  tablet Take 1,000 mg by mouth daily.   clopidogrel (PLAVIX) 75 MG tablet Take 1 tablet (75 mg total) by mouth daily with breakfast.   furosemide (LASIX) 20 MG tablet Take 1 tablet (20 mg total) by mouth daily.   losartan (COZAAR) 100 MG tablet Take 1 tablet (100 mg total) by mouth daily.   metoprolol succinate (TOPROL-XL) 50 MG 24 hr tablet Take 50 mg by mouth daily.   nitroGLYCERIN (NITROSTAT) 0.4 MG SL tablet Place 1 tablet (0.4 mg total) under the tongue every 5 (five) minutes as needed for chest pain.   pantoprazole (PROTONIX) 40 MG tablet TAKE 1 TABLET BY MOUTH ONCE DAILY BEFORE BREAKFAST   rosuvastatin (CRESTOR) 10 MG tablet Take 1 tablet (10 mg total) by mouth daily.   [DISCONTINUED] aspirin EC 81 MG tablet Take 1 tablet (81 mg total) by mouth daily.     Allergies:   Patient has no known allergies.   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  Tobacco Use   Smoking status: Former Smoker    Packs/day: 1.00    Years: 20.00    Pack years: 20.00    Types: Cigarettes    Quit date: 09/29/1983    Years since quitting: 36.7   Smokeless tobacco: Never Used  Vaping Use   Vaping Use: Never used  Substance and Sexual Activity   Alcohol use: Yes    Comment: rare   Drug use: No   Sexual activity: Not on file  Other Topics Concern   Not on file  Social History Narrative   Not on file   Social Determinants of Health   Financial Resource Strain:    Difficulty of Paying Living Expenses: Not on file  Food Insecurity:    Worried About Charity fundraiser in the Last Year: Not on file   [place] in the Last Year: Not on file  Transportation Needs:    Lack of Transportation  (Medical): Not on file   Lack of Transportation (Non-Medical): Not on file  Physical Activity:    Days of Exercise per Week: Not on file   Minutes of Exercise per Session: Not on file  Stress:    Feeling of Stress : Not on file  Social Connections:    Frequency of Communication with Friends and Family: Not on file   Frequency of Social Gatherings with Friends and Family: Not on file   Attends Religious Services: Not on file   Active Member of Clubs or Organizations: Not on file   Attends Archivist Meetings: Not on file   Marital Status: Not on file  Family History: The patient's family history includes Hypertension in his mother.  ROS:   Please see the history of present illness.     All other systems reviewed and are negative.  EKGs/Labs/Other Studies Reviewed:      EKG:  EKG is  ordered today.  The ekg ordered today demonstrates sinus rhythm 69 left bundle branch block first-degree AV block.  Left bundle branch block was incomplete on prior ECG.  Recent Labs: 08/31/2019: ALT 15; Hemoglobin 14.0; Platelets 177 09/28/2019: BUN 14; Creatinine, Ser 0.98; Potassium 4.2; Sodium 142  Recent Lipid Panel    Component Value Date/Time   CHOL 116 08/31/2019 0839   TRIG 103 08/31/2019 0839   HDL 46 08/31/2019 0839   CHOLHDL 2.5 08/31/2019 0839   LDLCALC 51 08/31/2019 0839    Physical Exam:    VS:  BP 128/70    Pulse 69    Ht 5\' 11"  (1.803 m)    Wt 212 lb (96.2 kg)    SpO2 98%    BMI 29.57 kg/m     Wt Readings from Last 3 Encounters:  07/01/20 212 lb (96.2 kg)  12/28/19 209 lb (94.8 kg)  10/16/19 204 lb (92.5 kg)     GEN:  Well nourished, well developed in no acute distress HEENT: Normal NECK: No JVD; No carotid bruits LYMPHATICS: No lymphadenopathy CARDIAC: RRR, no murmurs, rubs, gallops RESPIRATORY:  Clear to auscultation without rales, wheezing or rhonchi  ABDOMEN: Soft, non-tender, non-distended MUSCULOSKELETAL:  No edema; No deformity    SKIN: Warm and dry NEUROLOGIC:  Alert and oriented x 3 PSYCHIATRIC:  Normal affect   ASSESSMENT:    1. Coronary artery disease involving native coronary artery of native heart without angina pectoris   2. Chronic systolic heart failure ([place])   3. Angina pectoris (HCC)    PLAN:    In order of problems listed above:  Coronary artery disease -LAD and circumflex stent November 2020, on dual antiplatelet therapy aspirin and Plavix.  Since it has been almost 1 year, okay with stopping aspirin and continue with Plavix.  Angina -CP with bike, keeps riding, a little better. Left arm issues with numb, car steering wheel numb.   Metoprolol. ? Cervical spine stenosis. Also has stabbing pain in feet. Pressure on side of head. Ringing left ear.  Consider neurology evaluation or neurosurgery evaluation.  Chronic systolic heart failure -Class I-II symptoms.  On losartan 100, Toprol 50 At last check EF 45%.  Still riding bicycle.  Continue to encourage movement.  Can feel short of breath at times.  Works through it.  Aortic regurgitation -Moderate in intensity.  We will continue to monitor with echocardiograms.  Hyperlipidemia -On Crestor 10 mg once a day.  Would like for him to be on 20 because of high intensity dose.  Left bundle branch block -Stable.  Chronic.  First AV block -230 ms, no changes.  Lung mass -Dr. Roxan Hockey was following.  Decreased in size.  Monitoring. -Last CT reviewed.  There was mention of pulmonary fibrosis type changes.  Unsure if anything else needs to be done from a pulmonary aspect.  He has been seeing [name], [place].  Encouraged him to discuss this further with him.     Medication Adjustments/Labs and Tests Ordered: Current medicines are reviewed at length with the patient today.  Concerns regarding medicines are outlined above.  Orders Placed This Encounter  Procedures   EKG 12-Lead   No orders of the defined types were placed in  this  encounter.   Patient Instructions  Medication Instructions:  Please discontinue your Aspirin. continue all other medications as listed.  *If you need a refill on your cardiac medications before your next appointment, please call your pharmacy*   Please follow up with [name] in pulmonology.  Follow-Up: At [facility], you and your health needs are our priority.  As part of our continuing mission to provide you with exceptional heart care, we have created designated Provider Care Teams.  These Care Teams include your primary Cardiologist (physician) and Advanced Practice Providers (APPs -  Physician Assistants and Nurse Practitioners) who all work together to provide you with the care you need, when you need it.  We recommend signing up for the patient portal called "MyChart".  Sign up information is provided on this After Visit Summary.  MyChart is used to connect with patients for Virtual Visits (Telemedicine).  Patients are able to view lab/test results, encounter notes, upcoming appointments, etc.  Non-urgent messages can be sent to your provider as well.   To learn more about what you can do with MyChart, go to [email or web address].    Your next appointment:   6 month(s)  The format for your next appointment:   In Person  Provider:   Candee Furbish, MD   Thank you for choosing [facility]!!         Signed, Candee Furbish, MD  07/01/2020 4:56 PM    [place]

## 2020-07-01 NOTE — Patient Instructions (Signed)
Medication Instructions:  Please discontinue your Aspirin. continue all other medications as listed.  *If you need a refill on your cardiac medications before your next appointment, please call your pharmacy*   Please follow up with [name] in pulmonology.  Follow-Up: At [facility], you and your health needs are our priority.  As part of our continuing mission to provide you with exceptional heart care, we have created designated Provider Care Teams.  These Care Teams include your primary Cardiologist (physician) and Advanced Practice Providers (APPs -  Physician Assistants and Nurse Practitioners) who all work together to provide you with the care you need, when you need it.  We recommend signing up for the patient portal called "MyChart".  Sign up information is provided on this After Visit Summary.  MyChart is used to connect with patients for Virtual Visits (Telemedicine).  Patients are able to view lab/test results, encounter notes, upcoming appointments, etc.  Non-urgent messages can be sent to your provider as well.   To learn more about what you can do with MyChart, go to [email or web address].    Your next appointment:   6 month(s)  The format for your next appointment:   In Person  Provider:   Candee Furbish, MD   Thank you for choosing [facility]!!

## 2020-08-02 ENCOUNTER — Other Ambulatory Visit: Payer: Self-pay | Admitting: Cardiology

## 2020-08-05 ENCOUNTER — Other Ambulatory Visit: Payer: Self-pay

## 2020-08-05 MED ORDER — CLOPIDOGREL BISULFATE 75 MG PO TABS: 75.0000 mg | ORAL_TABLET | Freq: Every day | ORAL | 3 refills | Status: DC

## 2020-09-06 ENCOUNTER — Telehealth: Payer: Self-pay | Admitting: Pulmonary Disease

## 2020-09-06 DIAGNOSIS — J849 Interstitial pulmonary disease, unspecified: Secondary | ICD-10-CM

## 2020-09-06 NOTE — Telephone Encounter (Signed)
09/06/2020  Received notification of CT scan that was previously ordered expiring.  Discussed case with Dr. Valeta Harms.  He agrees the patient should proceed forward with obtaining another CT scanning of the chest.  It was felt the patient would benefit from completing another high-resolution CT chest per Dr. Valeta Harms.  If fibrosis/scarring seems progressed may need to consider referral to ILD specialist such as Dr. Vaughan Browner or Dr. Chase Caller within our office.  I have placed an order for a high-resolution CT chest.  [name], FNP

## 2020-09-09 NOTE — Telephone Encounter (Signed)
Called and spoke with pt letting him know the info stated by Aaron Edelman and he verbalized understanding stating that CT has been scheduled for 12/16. Nothing further needed.

## 2020-09-26 ENCOUNTER — Ambulatory Visit
Admission: RE | Admit: 2020-09-26 | Discharge: 2020-09-26 | Disposition: A | Discharge status: Home / Self Care | Payer: Medicare Other | Source: Ambulatory Visit | Attending: Pulmonary Disease | Admitting: Pulmonary Disease

## 2020-09-26 DIAGNOSIS — J849 Interstitial pulmonary disease, unspecified: Secondary | ICD-10-CM

## 2020-10-14 ENCOUNTER — Other Ambulatory Visit: Payer: Self-pay | Admitting: Cardiology

## 2020-10-25 ENCOUNTER — Ambulatory Visit (INDEPENDENT_AMBULATORY_CARE_PROVIDER_SITE_OTHER): Payer: Medicare Other | Admitting: Pulmonary Disease

## 2020-10-25 ENCOUNTER — Encounter: Payer: Self-pay | Admitting: Pulmonary Disease

## 2020-10-25 ENCOUNTER — Other Ambulatory Visit: Payer: Self-pay

## 2020-10-25 VITALS — BP 118/72 | HR 85 | Temp 97.0°F | Ht 71.0 in | Wt 220.8 lb

## 2020-10-25 DIAGNOSIS — J849 Interstitial pulmonary disease, unspecified: Secondary | ICD-10-CM

## 2020-10-25 DIAGNOSIS — R0600 Dyspnea, unspecified: Secondary | ICD-10-CM | POA: Diagnosis not present

## 2020-10-25 DIAGNOSIS — R0609 Other forms of dyspnea: Secondary | ICD-10-CM

## 2020-10-25 NOTE — Progress Notes (Signed)
Henry [name]    [id number]    1951/08/15  Primary Care Physician:Pa, [facility]  Referring Physician: [facility]ina Maidens [facility] [address],  [place] [address]  Chief complaint: Consult for interstitial lung disease  HPI: 70 year old ex-smoker referred for evaluation of interstitial lung disease.  He has history of hilar mass found in June 2020.  Underwent bronchoscopy and August 2020 with negative results for malignancy.  One of the biopsies did show granulomatous tissue..  Developed pneumomediastinum and was admitted briefly to the hospital.  He has done well since that time with follow-up scan showing improvement in the mass which is thought to be inflammatory in nature. There were additional findings of fibrotic lung disease and he has been referred to the ILD clinic for further evaluation. Additional medical history includes hypertension, acid reflux, coronary artery disease  Has mild dyspnea on exertion which is progressive in nature.  Denies any cough, sputum production, wheezing.  Pets: Has dogs, no birds Occupation: Nature conservation officer.   Exposures: Has brief history of asbestos exposure while working in [employer] in 1970s.  No mold, hot tub, Jacuzzi.  No feather pillows or comforter. Smoking history: 20-pack-year smoker.  Quit in 1995 Travel history: Originally from [place].  No significant recent travel Relevant family history: No family history of lung disease  Outpatient Encounter Medications as of 10/25/2020  Medication Sig  . acetaminophen (TYLENOL) 325 MG tablet Take 650 mg by mouth every 6 (six) hours as needed (FOR PAIN).  . Ascorbic Acid (VITAMIN C) 1000 MG tablet Take 1,000 mg by mouth daily.  . clopidogrel (PLAVIX) 75 MG tablet Take 1 tablet (75 mg total) by mouth daily with breakfast.  . furosemide (LASIX) 20 MG tablet Take 1 tablet (20 mg total) by mouth daily.  Marland Kitchen losartan (COZAAR) 100 MG tablet Take 1 tablet (100 mg  total) by mouth daily. PLEASE KEEP SCHEDULED FOLLOW UP FOR FURTHER REFILLS  . metoprolol succinate (TOPROL-XL) 50 MG 24 hr tablet Take 50 mg by mouth daily.  . nitroGLYCERIN (NITROSTAT) 0.4 MG SL tablet Place 1 tablet (0.4 mg total) under the tongue every 5 (five) minutes as needed for chest pain.  . pantoprazole (PROTONIX) 40 MG tablet TAKE 1 TABLET BY MOUTH ONCE DAILY BEFORE BREAKFAST  . rosuvastatin (CRESTOR) 10 MG tablet Take 1 tablet by mouth once daily   No facility-administered encounter medications on file as of 10/25/2020.    Allergies as of 10/25/2020  . (No Known Allergies)    Past Medical History:  Diagnosis Date  . Anemia    low iron  . BPH (benign prostatic hypertrophy)   . CAD (coronary artery disease), native coronary artery    a. LHC 07/2019: 90% mLAD s/p atherectomyDES, 75% pLCX, 40% LM, 25% pLAD, 50% 2nd Diag. LVEF 35-45. Normal LVEDP  . Dyspnea    "a little bit" with exertion  . Frequency of urination   . GERD (gastroesophageal reflux disease)    occasionally uses tums or 1 tbsp vinager  . History of bladder stone   . History of kidney stones   . Hypertension   . Ischemic cardiomyopathy     a. Echo 08/2019: LVEF 35-40%, global hypokinesis, G1DD  . Nocturia   . Pneumonia   . Status post insertion of drug-eluting stent into left anterior descending (LAD) artery for coronary artery disease 08/11/2019  . Wears glasses     Past Surgical History:  Procedure Laterality  Date  . CORONARY ATHERECTOMY N/A 08/11/2019   Procedure: CORONARY ATHERECTOMY;  Surgeon: Jettie Booze, MD;  Location: [place] [facility];  Service: Cardiovascular;  Laterality: N/A;  . CORONARY STENT INTERVENTION N/A 09/04/2019   Procedure: CORONARY STENT INTERVENTION;  Surgeon: Martinique, Peter M, MD;  Location: [place] [facility];  Service: Cardiovascular;  Laterality: N/A;  . CYSTO/  LEFT URETEROSCOPIC STONE EXTRACTION/  BLADDER  STONE EXTRACTION  02-15-2009  . CYSTOSCOPY WITH LITHOLAPAXY  N/A 09/29/2013   Procedure: CYSTOSCOPY WITH LITHOLAPAXY;  Surgeon: Franchot Gallo, MD;  Location: [facility];  Service: Urology;  Laterality: N/A;  . INTRAVASCULAR ULTRASOUND/IVUS N/A 08/11/2019   Procedure: Intravascular Ultrasound/IVUS;  Surgeon: Jettie Booze, MD;  Location: [place] [facility];  Service: Cardiovascular;  Laterality: N/A;  . LEFT HEART CATH AND CORONARY ANGIOGRAPHY N/A 08/11/2019   Procedure: LEFT HEART CATH AND CORONARY ANGIOGRAPHY;  Surgeon: Jettie Booze, MD;  Location: [place] [facility];  Service: Cardiovascular;  Laterality: N/A;  . LUMBAR [place] SURGERY  09-19-2000   LEFT  L4 -- L5  . REPAIR RIGHT HAND SOFT TISSUE STRUCTURES  02-24-2000   WORK INJURY /  PARTIAL AMPUTATION RIGHT INDEX FINGER  . TRANSURETHRAL RESECTION OF PROSTATE N/A 09/29/2013   Procedure: TRANSURETHRAL RESECTION OF THE PROSTATE WITH GYRUS INSTRUMENTS;  Surgeon: Franchot Gallo, MD;  Location: [facility];  Service: Urology;  Laterality: N/A;  . VIDEO BRONCHOSCOPY WITH ENDOBRONCHIAL ULTRASOUND N/A 05/16/2019   Procedure: VIDEO BRONCHOSCOPY WITH ENDOBRONCHIAL ULTRASOUND;  Surgeon: Garner Nash, DO;  Location: [facility];  Service: Thoracic;  Laterality: N/A;    Family History  Problem Relation Age of Onset  . Hypertension Mother     Social History   Socioeconomic History  . Marital status: Married    Spouse name: Not on file  . Number of children: Not on file  . Years of education: Not on file  . Highest education level: Not on file  Occupational History  . 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 Smoking status: Former Smoker    Packs/day: 1.00    Years: 20.00    Pack years: 20.00    Types: Cigarettes    Quit date: 09/29/1983    Years since quitting: 37.0  . Smokeless tobacco: Never Used  Vaping Use  . Vaping Use: Never used  Substance and Sexual Activity  . Alcohol  use: Yes    Comment: rare  . Drug use: No  . Sexual activity: Not on file  Other Topics Concern  . Not on file  Social History Narrative  . Not on file   Social Determinants of Health   Financial Resource Strain: Not on file  Food Insecurity: Not on file  Transportation Needs: Not on file  Physical Activity: Not on file  Stress: Not on file  Social Connections: Not on file  Intimate Partner Violence: Not on file    Review of systems: Review of Systems  Constitutional: Negative for fever and chills.  HENT: Negative.   Eyes: Negative for blurred vision.  Respiratory: as per HPI  Cardiovascular: Negative for chest pain and palpitations.  Gastrointestinal: Negative for vomiting, diarrhea, blood per rectum. Genitourinary: Negative for dysuria, urgency, frequency and hematuria.  Musculoskeletal: Negative for myalgias, back pain and joint pain.  Skin: Negative for itching and rash.  Neurological: Negative for dizziness, tremors, focal weakness, seizures and  loss of consciousness.  Endo/Heme/Allergies: Negative for environmental allergies.  Psychiatric/Behavioral: Negative for depression, suicidal ideas and hallucinations.  All other systems reviewed and are negative.  Physical Exam: Blood pressure 118/72, pulse 85, temperature (!) 97 F (36.1 C), temperature source Oral, height 5\' 11"  (1.803 m), weight 220 lb 12.8 oz (100.2 kg), SpO2 98 %. Gen:      No acute distress HEENT:  EOMI, sclera anicteric Neck:     No masses; no thyromegaly Lungs:    Clear to auscultation bilaterally; normal respiratory effort CV:         Regular rate and rhythm; no murmurs Abd:      + bowel sounds; soft, non-tender; no palpable masses, no distension Ext:    No edema; adequate peripheral perfusion Skin:      Warm and dry; no rash Neuro: alert and oriented x 3 Psych: normal mood and affect  Data Reviewed: Imaging: High-resolution CT 09/26/2020- basilar predominant fibrotic interstitial lung  disease.  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  No ongoing exposures or symptoms of connective tissue disease  Check CTD serologies and questionnaire to further evaluate exposures Schedule PFTs and follow-up in clinic. Not sure if he needs antifibrotic's unless there is progression of disease  Plan/Recommendations: CTD serologies, ILD questionnaire PFTs  Marshell Garfinkel MD [facility] 10/25/2020, 10:41 AM  CC: Pa, [facility]*

## 2020-10-25 NOTE — Patient Instructions (Signed)
Check labs for evaluation of connective tissue disease and interstitial lung disease Will give you a questionnaire to evaluate exposures. We will schedule PFTs in 3 months and follow-up in clinic after that

## 2020-10-28 ENCOUNTER — Telehealth: Payer: Self-pay

## 2020-10-28 NOTE — Telephone Encounter (Signed)
Pt states he got Moderna vaccine at [facility].  He will bring his card to dr office next time to be scanned in.

## 2020-10-29 LAB — SJOGREN'S SYNDROME ANTIBODS(SSA + SSB)
SSA (Ro) (ENA) Antibody, IgG: <1 AI
SSB (La) (ENA) Antibody, IgG: <1 AI

## 2020-10-29 LAB — ANTI-SCLERODERMA ANTIBODY: Scleroderma (Scl-70) (ENA) Antibody, IgG: <1 AI

## 2020-10-29 LAB — ANCA SCREEN W REFLEX TITER: ANCA Screen: NEGATIVE

## 2020-10-29 LAB — ANA,IFA RA DIAG PNL W/RFLX TIT/PATN
Anti Nuclear Antibody (ANA): NEGATIVE
Cyclic Citrullin Peptide Ab: <16 UNITS
Rheumatoid fact SerPl-aCnc: <14 IU/mL (ref <14)

## 2020-11-01 LAB — HYPERSENSITIVITY PNEUMONITIS
A. Pullulans Abs: NEGATIVE
A.Fumigatus #1 Abs: NEGATIVE
Micropolyspora faeni, IgG: NEGATIVE
Pigeon Serum Abs: NEGATIVE
Thermoact. Saccharii: NEGATIVE
Thermoactinomyces vulgaris, IgG: NEGATIVE

## 2020-11-05 ENCOUNTER — Other Ambulatory Visit: Payer: Self-pay

## 2020-11-05 MED ORDER — ROSUVASTATIN CALCIUM 10 MG PO TABS: 10.0000 mg | ORAL_TABLET | Freq: Every day | ORAL | 2 refills | Status: DC

## 2020-11-05 MED ORDER — LOSARTAN POTASSIUM 100 MG PO TABS: 100.0000 mg | ORAL_TABLET | Freq: Every day | ORAL | 2 refills | Status: DC

## 2020-11-05 MED ORDER — CLOPIDOGREL BISULFATE 75 MG PO TABS: 75.0000 mg | ORAL_TABLET | Freq: Every day | ORAL | 2 refills | Status: DC

## 2020-11-05 MED ORDER — PANTOPRAZOLE SODIUM 40 MG PO TBEC: DELAYED_RELEASE_TABLET | ORAL | 2 refills | Status: DC

## 2020-11-05 MED ORDER — FUROSEMIDE 20 MG PO TABS: 20.0000 mg | ORAL_TABLET | Freq: Every day | ORAL | 2 refills | Status: DC

## 2020-11-07 LAB — MYOMARKER 3 PLUS PROFILE (RDL)

## 2020-11-15 ENCOUNTER — Other Ambulatory Visit: Payer: Self-pay | Admitting: Cardiology

## 2020-11-20 IMAGING — CT CT ANGIO CHEST-ABD-PELV FOR DISSECTION W/ AND WO/W CM
2 of 7 series · 14 of 46 positions shown, 16 images · IV contrast (omnipaque)
Comparison: 08/12/2011 and previous

CLINICAL DATA: Back pain, shortness of breath

EXAM:
CT ANGIOGRAPHY CHEST, ABDOMEN AND PELVIS
TECHNIQUE: Multidetector CT imaging through the chest, abdomen and pelvis was
performed using the standard protocol during bolus administration of
intravenous contrast. Multiplanar reconstructed images and MIPs were
obtained and reviewed to evaluate the vascular anatomy.
CONTRAST:  100mL OMNIPAQUE IOHEXOL 350 MG/ML SOLN

[Series 6: arterial · axial · arterial · 0.82mm/px · z∈[+636,+1244]mm · 11 of 340 slices shown, 13 images]
[im 18/340  soft-tissue]
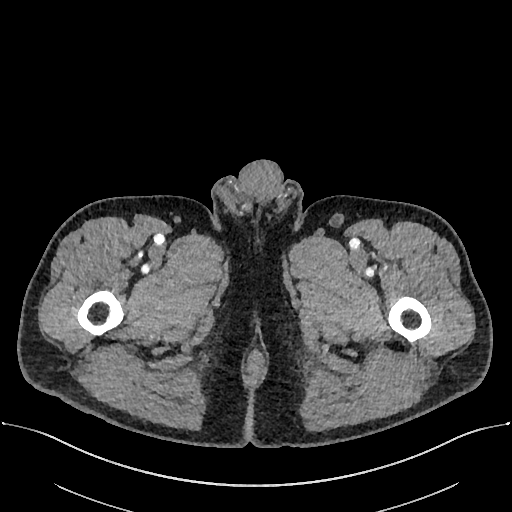
[im 18/340  bone]
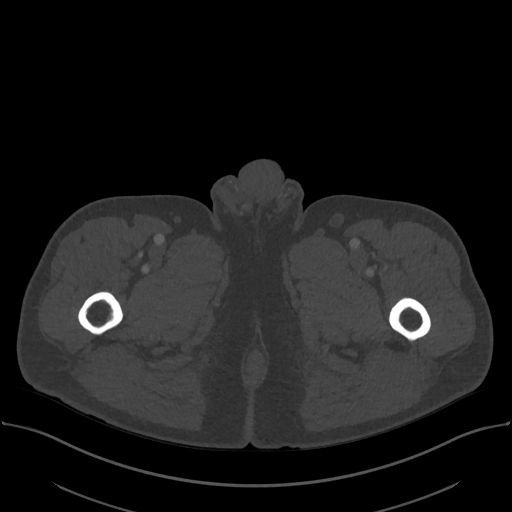
[im 54/340  soft-tissue]
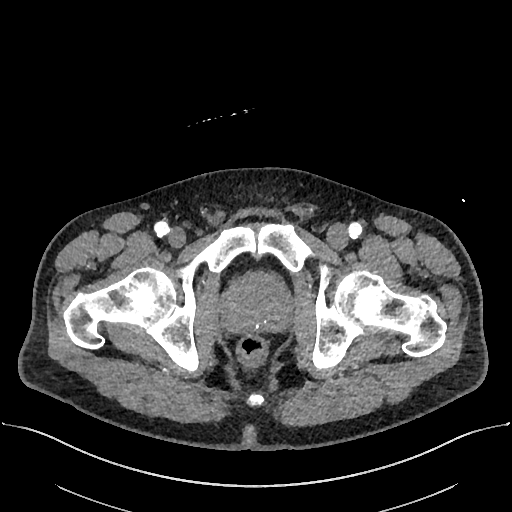
[im 90/340  soft-tissue]
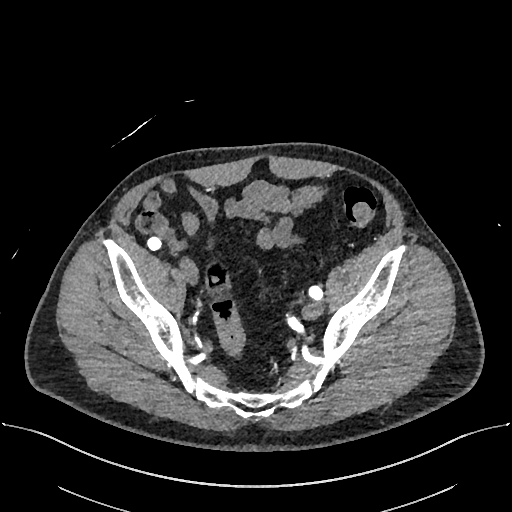
[im 108/340  soft-tissue]
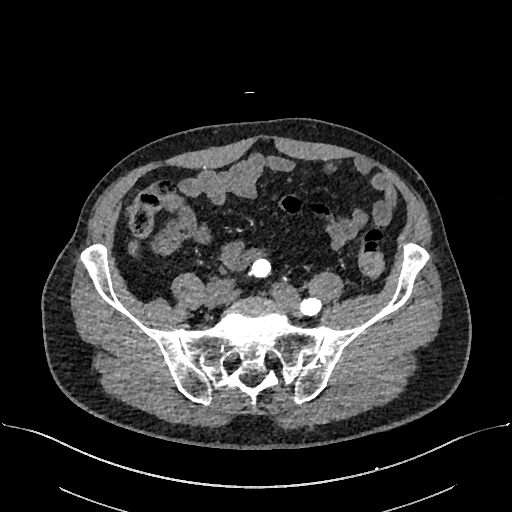
[im 143/340  soft-tissue]
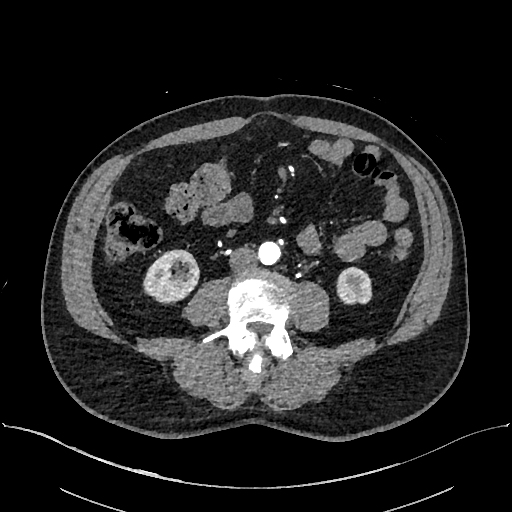
[im 179/340  soft-tissue]
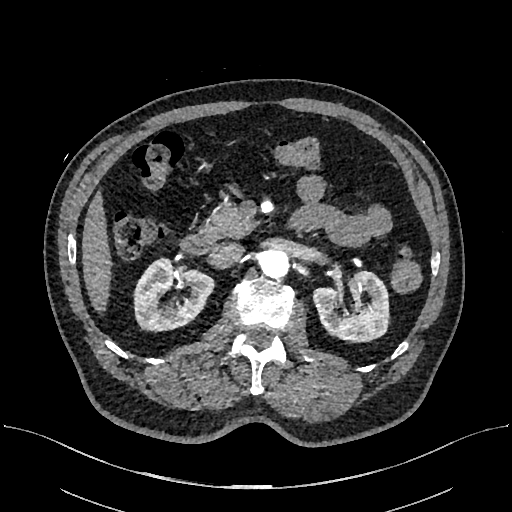
[im 197/340  soft-tissue]
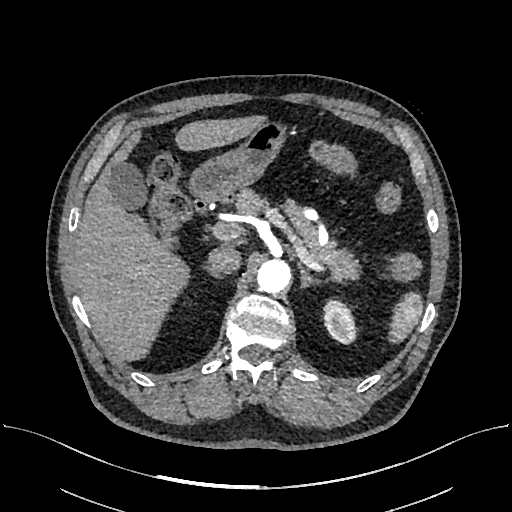
[im 232/340  soft-tissue]
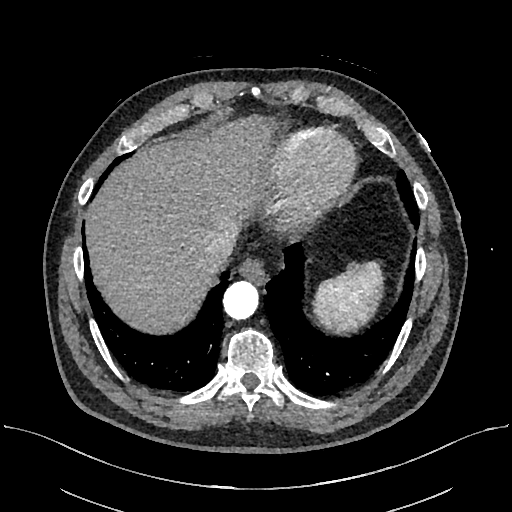
[im 250/340  soft-tissue]
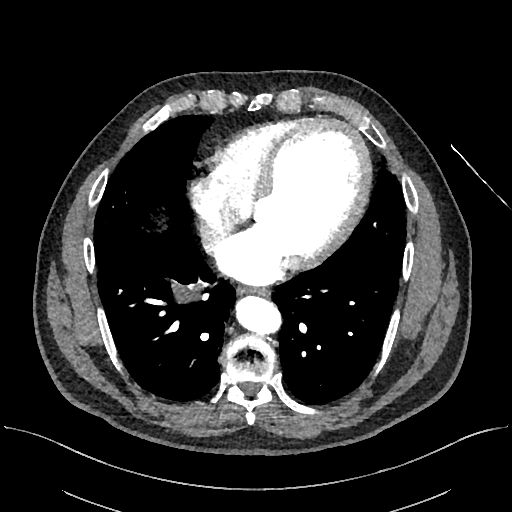
[im 250/340  bone]
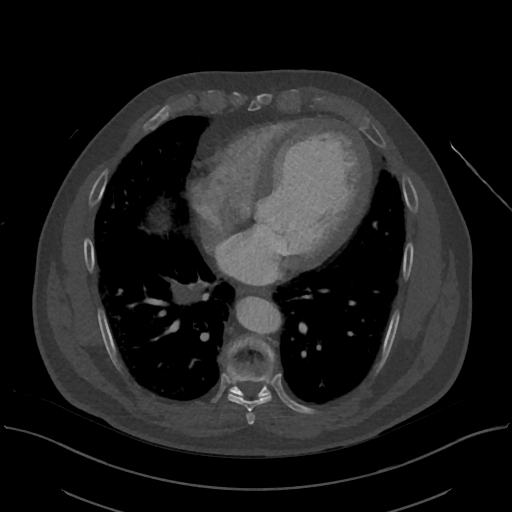
[im 286/340  soft-tissue]
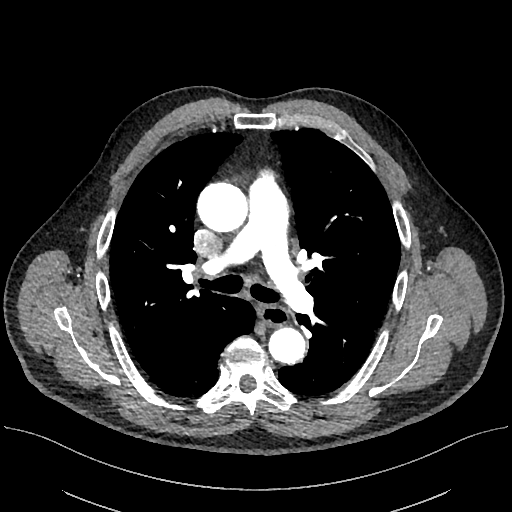
[im 322/340  soft-tissue]
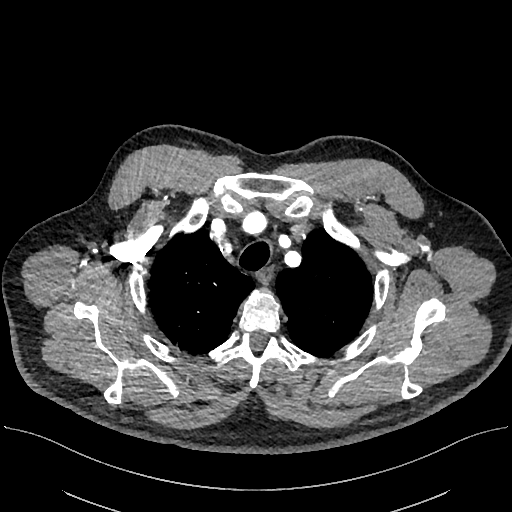

[Series 9: cor · coronal · 0.89mm/px · 3 of 182 slices shown]
[im 46/182  soft-tissue]
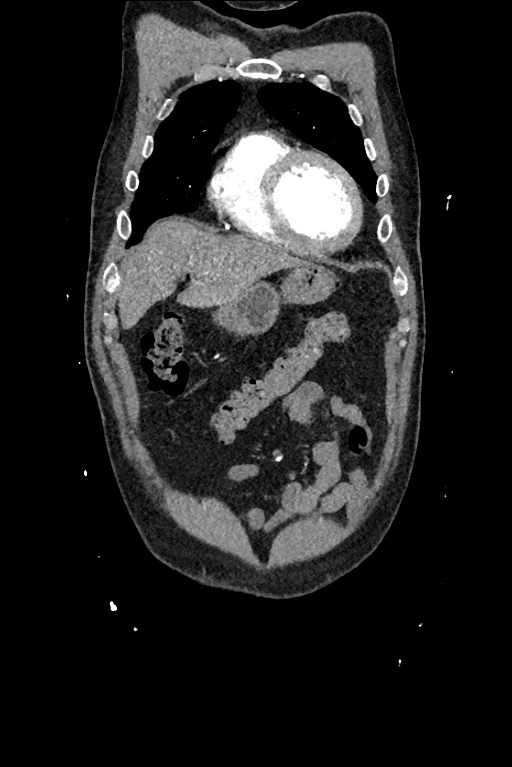
[im 91/182  soft-tissue]
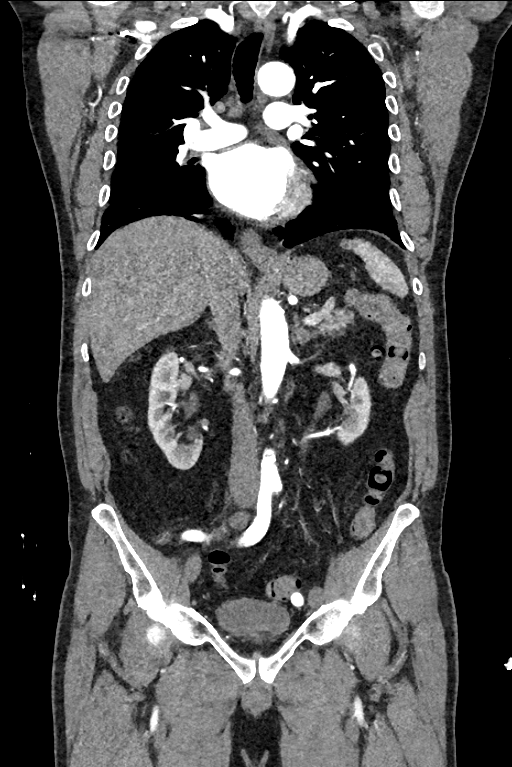
[im 136/182  soft-tissue]
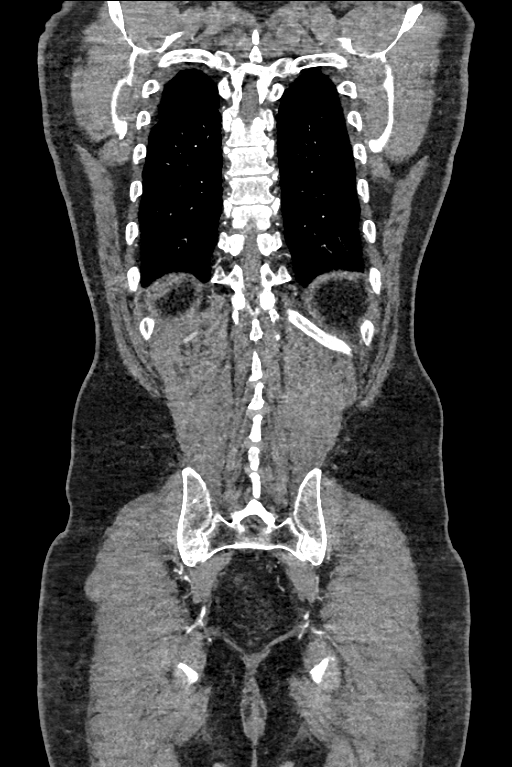

[14 of 46 positions shown; findings below may reference images not displayed]

FINDINGS: CTA CHEST FINDINGS

Cardiovascular: Heart size upper limits normal. RV is nondilated.
Satisfactory opacification of pulmonary arteries noted, and there is
no evidence of pulmonary emboli. Scattered coronary calcifications.
Good contrast opacification of the thoracic aorta. 4.1 cm aneurysmal
dilatation of the ascending thoracic aorta. Classic 3 vessel
brachiocephalic arterial origin anatomy without proximal stenosis.
No dissection or stenosis. Minimal scattered plaque in the
descending segment.

Mediastinum/Nodes: 1.2 cm precarinal lymph node. 1.5 cm right hilar
lymph node.

Lungs/Pleura: No pleural effusion. No pneumothorax. 3.4 x 2.1 cm
right infrahilar mass abutting the hilum, new since previous. Small
subpleural blebs in the lung apices.

Musculoskeletal: Mild spondylitic changes in the lower thoracic
spine. No fracture or worrisome bone lesion.

Review of the MIP images confirms the above findings.

CTA ABDOMEN AND PELVIS FINDINGS

VASCULAR

Aorta: Mild calcified atheromatous plaque in the infrarenal segment.
No aneurysm, dissection, or stenosis.

Celiac: Patent without evidence of aneurysm, dissection, vasculitis
or significant stenosis.

SMA: Patent without evidence of aneurysm, dissection, vasculitis or
significant stenosis.

Renals: Duplicated left, superior dominant, both patent.

Duplicated right, superior dominant, both patent.

IMA: Patent without evidence of aneurysm, dissection, vasculitis or
significant stenosis.

Inflow: Mildly tortuous iliac arterial system without aneurysm or
stenosis.

Veins: No obvious venous abnormality within the limitations of this
arterial phase study.

Review of the MIP images confirms the above findings.

NON-VASCULAR

Hepatobiliary: No focal liver abnormality is seen. No gallstones,
gallbladder wall thickening, or biliary dilatation.

Pancreas: Unremarkable. No pancreatic ductal dilatation or
surrounding inflammatory changes.

Spleen: Normal in size without focal abnormality.

Adrenals/Urinary Tract: Normal adrenals. No renal mass or
hydronephrosis. Urinary bladder incompletely distended.

Stomach/Bowel: Stomach is nondistended. Small bowel decompressed.
Normal appendix. The colon is nondilated with scattered distal
descending and sigmoid diverticula; no adjacent
inflammatory/edematous change or abscess.

Lymphatic: No abdominal or pelvic adenopathy.

Reproductive: Prostate enlargement with central coarse
calcifications.

Other: No ascites. No free air.

Musculoskeletal: Spondylitic changes L4-5. No fracture or worrisome
bone lesion.

Review of the MIP images confirms the above findings.
IMPRESSION: 1. No acute PE or thoracic aortic dissection.
2. 3.4 cm right infrahilar mass, suspicious for primary bronchogenic
carcinoma. Follow-up recommended.
3. Mild right hilar and mediastinal adenopathy, possibly metastatic
disease.
4. 4.1 cm ascending thoracic aortic aneurysm. Recommend annual
imaging followup by CTA or MRA. This recommendation follows 2575
ACCF/AHA/AATS/ACR/ASA/SCA/SOKU/SARRA/NAZARETH/MALLORY Guidelines for the
Diagnosis and Management of Patients with Thoracic Aortic Disease.
Circulation. 2575; 121: [id number]
5. Coronary and aortic Atherosclerosis ([id number]).
6. Descending and sigmoid diverticulosis.
7. Prostatomegaly.
8. Lumbar degenerative changes as above.

## 2020-12-06 IMAGING — CT NUCLEAR MEDICINE PET IMAGE INITIAL (PI) SKULL BASE TO THIGH
8 series · 16 of 16 positions shown · non-contrast
Comparison: Chest CT from 05/17/2019

CLINICAL DATA: Initial treatment strategy for right infrahilar
nodule. Malignant cells not identified at bronchoscopic sampling.

EXAM:
NUCLEAR MEDICINE PET SKULL BASE TO THIGH
TECHNIQUE: 12.5 mCi F-18 FDG was injected intravenously. Full-ring PET imaging
was performed from the skull base to thigh after the radiotracer. CT
data was obtained and used for attenuation correction and anatomic
localization.
Fasting blood glucose: 86 mg/dl

[Series 3: pet sk_thigh ac · axial · 5.0mm · 4.07mm/px · z∈[-878,+70]mm · 3 of 238 slices shown]
[im 1/238]
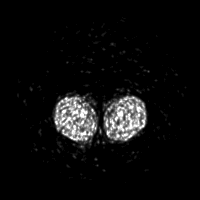
[im 119/238]
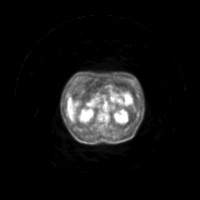
[im 238/238]
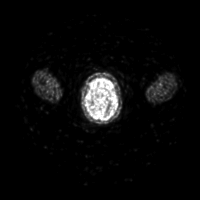

[Series 4: ct sk_thigh 5.0 hd_fov · axial · 1.12mm/px · z∈[-878,+70]mm · 3 of 238 slices shown]
[im 1/238  soft-tissue]
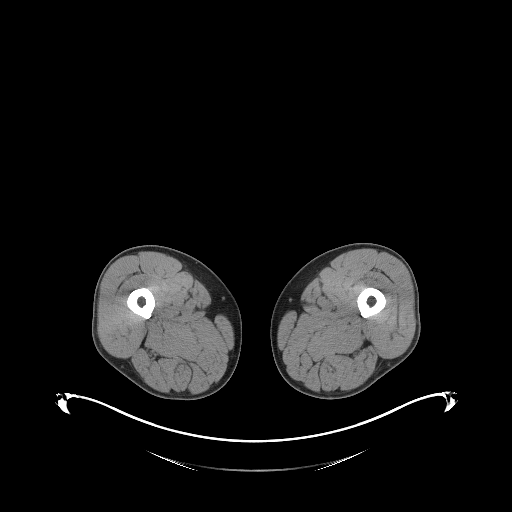
[im 119/238  soft-tissue]
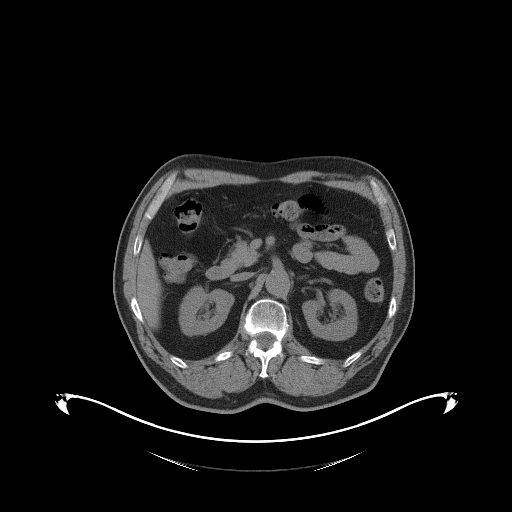
[im 238/238  soft-tissue]
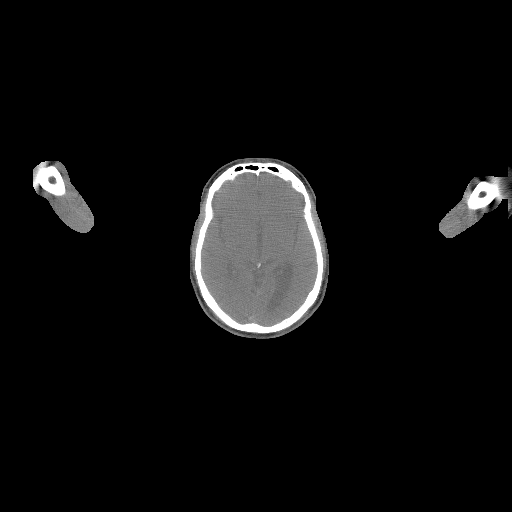

[Series 5: pet sk_thigh nac · axial · 5.0mm · 4.07mm/px · z∈[-878,+70]mm · 3 of 238 slices shown]
[im 1/238]
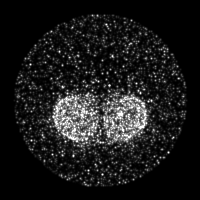
[im 119/238]
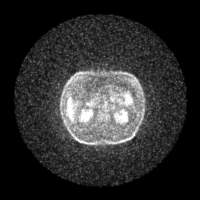
[im 238/238]
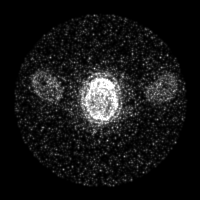

[Series 8: ct sk_thigh 5.0 b70f (id)_bone · axial · 0.63mm/px · 1 of 56 slices shown]
[im 1/56  soft-tissue]
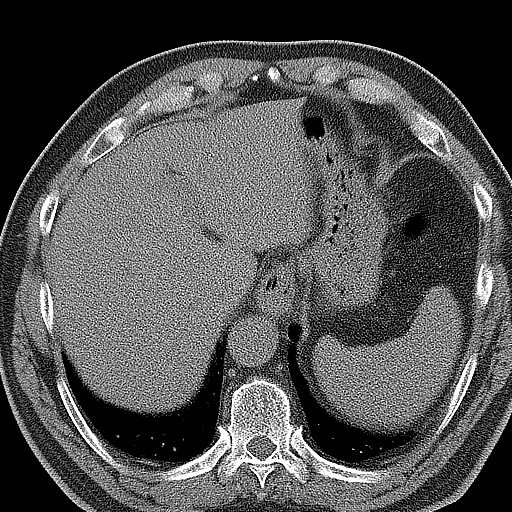

[Series 603: mip range 1 · coronal · 1.97mm/px · 1 of 32 slices shown]
[im 1/32]
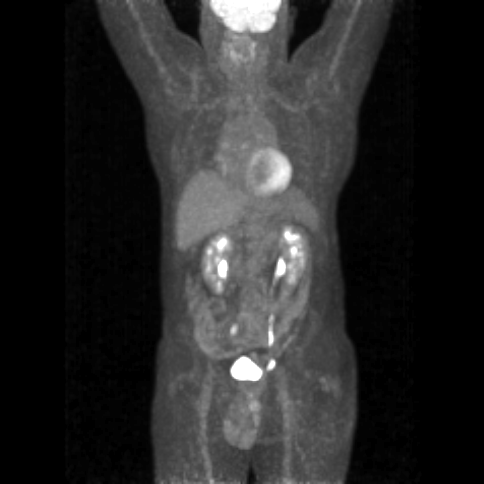

[Series 604: range-ct sk_thigh 5.0 hd_fov-cor-<alpha range> · 1 of 85 slices shown]
[im 1/85]
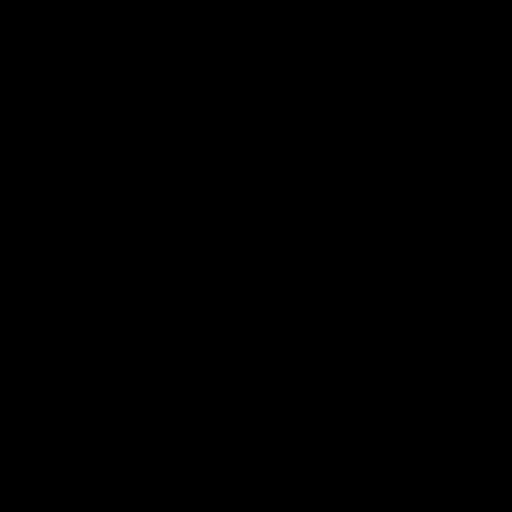

[Series 605: range-ct sk_thigh 5.0 hd_fov-tra-<alpha range> · 3 of 226 slices shown]
[im 1/226]
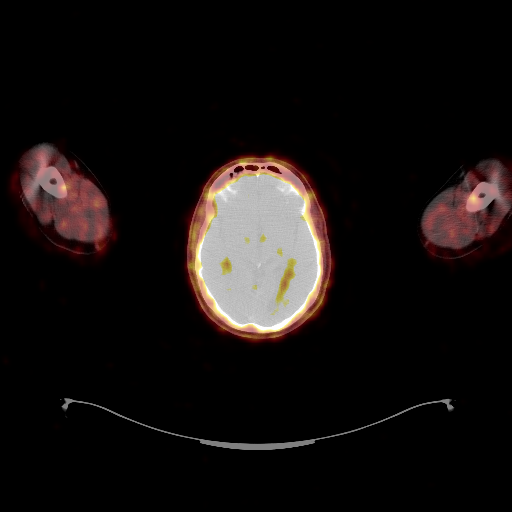
[im 113/226]
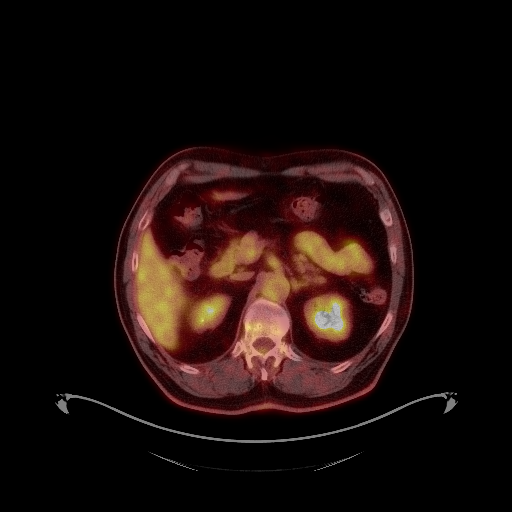
[im 226/226]
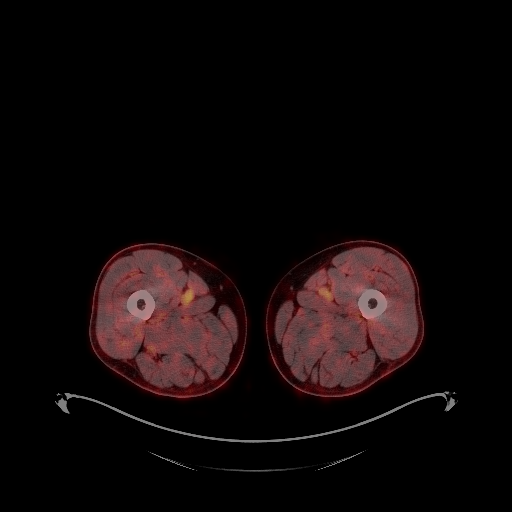

[Series 1118: results mm oncology reading · 5.0mm · 0.77mm/px · 1 of 9 slices shown]
[im 1/9]
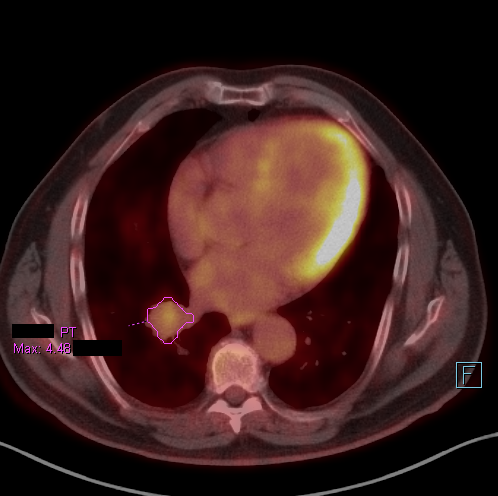

[16 of 16 positions shown; findings below may reference images not displayed]

FINDINGS: Mediastinal blood pool activity: SUV max

Liver activity: SUV max

NECK: No significant abnormal hypermetabolic activity in this
region.

Incidental CT findings: none

CHEST: [DATE] by 2.7 cm right infrahilar mass on image 83/4, maximum
SUV 4.5. A right infrahilar lymph node measuring about 1.5 cm in
short axis (previously 1.7 cm) has a maximum SUV of 4.0.

Small right hilar node maximum SUV 4.0. 0.9 cm subcarinal node,
maximum SUV 4.5. Small AP window lymph nodes with maximum SUV up to
3.8

Incidental CT findings: Minimal residual pneumomediastinum. Moderate
cardiomegaly. Left anterior descending coronary artery
atherosclerosis. 4.1 cm ascending thoracic aortic aneurysm, stable.

ABDOMEN/PELVIS: No significant abnormal hypermetabolic activity in
this region.

Incidental CT findings: 2 mm right kidney upper pole nonobstructive
renal calculus. Aortoiliac atherosclerotic vascular disease. Chronic
borderline prominence of the appendix, as on 08/12/2011. Sigmoid
colon diverticulosis. Considerable prostatomegaly.

SKELETON: No significant abnormal hypermetabolic activity in this
region.

Incidental CT findings: none
IMPRESSION: 1. The dominant right lower lobe mass similar in size to the prior
CT of 05/08/2019 with maximum SUV of 4.5. Enlarged right infrahilar
lymph node with maximum SUV of 4.0. Both of these of greater than
blood pool activity although are not as hypermetabolic as the liver.
Similar activity in smaller right hilar and subcarinal lymph nodes.
Biopsies were negative for malignant cells and right hilar mass
biopsy included some cytologic features suggestive of granulomatous
inflammation. Granulomatous response can certainly cause accentuated
metabolic activity and cannot be excluded based on current PET-CT
finding; low-grade malignancy could have a similar appearance on
PET-CT.
2. Minimal residual pneumomediastinum.
3. Other imaging findings of potential clinical significance: Aortic
Atherosclerosis (PQAFI-D13.3). Coronary atherosclerosis. Moderate
cardiomegaly. Stable appearance of 4.1 cm ascending thoracic aortic
aneurysm. Aortic aneurysm NOS (PQAFI-DX5.E). Nonobstructive right
nephrolithiasis. Sigmoid colon diverticulosis. Prostatomegaly.

## 2020-12-25 ENCOUNTER — Ambulatory Visit (INDEPENDENT_AMBULATORY_CARE_PROVIDER_SITE_OTHER): Payer: Medicare Other | Admitting: Cardiology

## 2020-12-25 ENCOUNTER — Other Ambulatory Visit: Payer: Self-pay

## 2020-12-25 ENCOUNTER — Encounter: Payer: Self-pay | Admitting: Cardiology

## 2020-12-25 VITALS — BP 122/66 | HR 68 | Ht 71.0 in | Wt 221.8 lb

## 2020-12-25 DIAGNOSIS — Z79899 Other long term (current) drug therapy: Secondary | ICD-10-CM | POA: Diagnosis not present

## 2020-12-25 DIAGNOSIS — I5022 Chronic systolic (congestive) heart failure: Secondary | ICD-10-CM | POA: Diagnosis not present

## 2020-12-25 DIAGNOSIS — R0602 Shortness of breath: Secondary | ICD-10-CM

## 2020-12-25 MED ORDER — SPIRONOLACTONE 25 MG PO TABS: 12.5000 mg | ORAL_TABLET | Freq: Every day | ORAL | 3 refills | Status: DC

## 2020-12-25 NOTE — Progress Notes (Unsigned)
Cardiology Office Note:    Date:  12/25/2020   ID:  Henry [name], DOB 1951-08-27, MRN [id number]  PCP:  Pa, [facility]  Cardiologist:  Candee Furbish, MD  Advanced Practice Provider:  No care team member to display Electrophysiologist:  None       Referring MD: Pa, [facility]*     History of Present Illness:    Henry [name] is a 70 y.o. male here for the evaluation of chest pain and shortness of breath with activity.  Relieved with nitroglycerin.  This has been present for many years.  Past Medical History:  Diagnosis Date  . Anemia    low iron  . BPH (benign prostatic hypertrophy)   . CAD (coronary artery disease), native coronary artery    a. LHC 07/2019: 90% mLAD s/p atherectomyDES, 75% pLCX, 40% LM, 25% pLAD, 50% 2nd Diag. LVEF 35-45. Normal LVEDP  . Dyspnea    "a little bit" with exertion  . Frequency of urination   . GERD (gastroesophageal reflux disease)    occasionally uses tums or 1 tbsp vinager  . History of bladder stone   . History of kidney stones   . Hypertension   . Ischemic cardiomyopathy     a. Echo 08/2019: LVEF 35-40%, global hypokinesis, G1DD  . Nocturia   . Pneumonia   . Status post insertion of drug-eluting stent into left anterior descending (LAD) artery for coronary artery disease 08/11/2019  . Wears glasses     Past Surgical History:  Procedure Laterality Date  . CORONARY ATHERECTOMY N/A 08/11/2019   Procedure: CORONARY ATHERECTOMY;  Surgeon: Jettie Booze, MD;  Location: [place] [facility];  Service: Cardiovascular;  Laterality: N/A;  . CORONARY STENT INTERVENTION N/A 09/04/2019   Procedure: CORONARY STENT INTERVENTION;  Surgeon: Martinique, Peter M, MD;  Location: [place] [facility];  Service: Cardiovascular;  Laterality: N/A;  . CYSTO/  LEFT URETEROSCOPIC STONE EXTRACTION/  BLADDER  STONE EXTRACTION  02-15-2009  . CYSTOSCOPY WITH LITHOLAPAXY N/A 09/29/2013   Procedure:  CYSTOSCOPY WITH LITHOLAPAXY;  Surgeon: Franchot Gallo, MD;  Location: [facility];  Service: Urology;  Laterality: N/A;  . INTRAVASCULAR ULTRASOUND/IVUS N/A 08/11/2019   Procedure: Intravascular Ultrasound/IVUS;  Surgeon: Jettie Booze, MD;  Location: [place] [facility];  Service: Cardiovascular;  Laterality: N/A;  . LEFT HEART CATH AND CORONARY ANGIOGRAPHY N/A 08/11/2019   Procedure: LEFT HEART CATH AND CORONARY ANGIOGRAPHY;  Surgeon: Jettie Booze, MD;  Location: [place] [facility];  Service: Cardiovascular;  Laterality: N/A;  . LUMBAR [place] SURGERY  09-19-2000   LEFT  L4 -- L5  . REPAIR RIGHT HAND SOFT TISSUE STRUCTURES  02-24-2000   WORK INJURY /  PARTIAL AMPUTATION RIGHT INDEX FINGER  . TRANSURETHRAL RESECTION OF PROSTATE N/A 09/29/2013   Procedure: TRANSURETHRAL RESECTION OF THE PROSTATE WITH GYRUS INSTRUMENTS;  Surgeon: Franchot Gallo, MD;  Location: [facility];  Service: Urology;  Laterality: N/A;  . VIDEO BRONCHOSCOPY WITH ENDOBRONCHIAL ULTRASOUND N/A 05/16/2019   Procedure: VIDEO BRONCHOSCOPY WITH ENDOBRONCHIAL ULTRASOUND;  Surgeon: Garner Nash, DO;  Location: [facility];  Service: Thoracic;  Laterality: N/A;    Current Medications: Current Meds  Medication Sig  . acetaminophen (TYLENOL) 325 MG tablet Take 650 mg by mouth every 6 (six) hours as needed (FOR PAIN).  . Ascorbic Acid (VITAMIN C) 1000 MG tablet Take 1,000 mg by mouth daily.  . clopidogrel (PLAVIX) 75 MG  tablet Take 1 tablet (75 mg total) by mouth daily with breakfast.  . losartan (COZAAR) 100 MG tablet Take 1 tablet (100 mg total) by mouth daily.  . metoprolol succinate (TOPROL-XL) 50 MG 24 hr tablet Take 50 mg by mouth daily.  . nitroGLYCERIN (NITROSTAT) 0.4 MG SL tablet DISSOLVE ONE TABLET UNDER THE TONGUE EVERY 5 MINUTES AS NEEDED FOR CHEST PAIN.  DO NOT EXCEED A TOTAL OF 3 DOSES IN 15 MINUTES  . pantoprazole (PROTONIX) 40 MG tablet TAKE 1 TABLET BY MOUTH ONCE DAILY  BEFORE BREAKFAST  . rosuvastatin (CRESTOR) 10 MG tablet Take 1 tablet (10 mg total) by mouth daily.  Marland Kitchen spironolactone (ALDACTONE) 25 MG tablet Take 0.5 tablets (12.5 mg total) by mouth daily.     Allergies:   Patient has no known allergies.   Social History   Socioeconomic History  . Marital status: Married    Spouse name: Not on file  . Number of children: Not on file  . Years of education: Not on file  . Highest education level: Not on file  Occupational History  . 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 Smoking status: Former Smoker    Packs/day: 1.00    Years: 20.00    Pack years: 20.00    Types: Cigarettes    Quit date: 09/29/1983    Years since quitting: 37.2  . Smokeless tobacco: Never Used  Vaping Use  . Vaping Use: Never used  Substance and Sexual Activity  . Alcohol use: Yes    Comment: rare  . Drug use: No  . Sexual activity: Not on file  Other Topics Concern  . Not on file  Social History Narrative  . Not on file   Social Determinants of Health   Financial Resource Strain: Not on file  Food Insecurity: Not on file  Transportation Needs: Not on file  Physical Activity: Not on file  Stress: Not on file  Social Connections: Not on file     Family History: The patient's family history includes Hypertension in his mother.  ROS:   Please see the history of present illness.     All other systems reviewed and are negative.  EKGs/Labs/Other Studies Reviewed:    The following studies were reviewed today:  ECHO: 01/16/20:  1. Left ventricular ejection fraction, by estimation, is 40 to 45%. The  left ventricle has mildly decreased function. The left ventricle  demonstrates regional wall motion abnormalities (see scoring  diagram/findings for description). There is mild  concentric left ventricular hypertrophy. Left ventricular diastolic  parameters are consistent with Grade I  diastolic dysfunction (impaired  relaxation).  2. Right ventricular systolic function is normal. The right ventricular  size is normal. There is normal pulmonary artery systolic pressure. The  estimated right ventricular systolic pressure is 17.6 mmHg.  3. Left atrial size was mildly dilated.  4. The mitral valve is grossly normal. Mild mitral valve regurgitation.  No evidence of mitral stenosis.  5. The aortic valve is tricuspid. Aortic valve regurgitation is moderate.  No aortic stenosis is present.  6. Aortic dilatation noted. There is mild dilatation of the aortic root  measuring 42 mm.  7. The inferior vena cava is dilated in size with >50% respiratory  variability, suggesting right atrial pressure of 8 mmHg.   Comparison(s): A prior study was performed on 08/15/2019. Changes from  prior study are noted. EF now  40-45%. RWMA more apparent on this study.   Cath 09/04/2019:   Prox Cx lesion is 75% stenosed.  Post intervention, there is a 0% residual stenosis.  A drug-eluting stent was successfully placed using a STENT SYNERGY DES 3X12.  A drug-eluting stent was successfully placed using a STENT SYNERGY DES 3.5X8.   1. Successful PCI of the proximal LCx with DES x 2.    Mid LM to Dist LM lesion is 40% stenosed. Cross sectional area well over 6 mm2, and thus not hemodynamically significant.  Prox Cx lesion is 75% stenosed.  Prox LAD lesion is 25% stenosed.  Mid LAD lesion is 90% stenosed. Heavily calcified by IVUS  A drug-eluting stent was successfully placed using a STENT SYNERGY DES 3X20, after orbital atherectomy.  Post intervention, there is a 0% residual stenosis.  2nd Diag lesion is 50% stenosed.  The left ventricular systolic function is normal. LVEDP 18 mm Hg.  LV end diastolic pressure is normal.  The left ventricular ejection fraction is 35-45% by visual estimate.  There is no aortic valve stenosis.    Recent Labs: No results found for  requested labs within last 8760 hours.  Recent Lipid Panel    Component Value Date/Time   CHOL 116 08/31/2019 0839   TRIG 103 08/31/2019 0839   HDL 46 08/31/2019 0839   CHOLHDL 2.5 08/31/2019 0839   LDLCALC 51 08/31/2019 0839     Risk Assessment/Calculations:      Physical Exam:    VS:  BP 122/66   Pulse 68   Ht 5\' 11"  (1.803 m)   Wt 221 lb 12.8 oz (100.6 kg)   SpO2 98%   BMI 30.93 kg/m     Wt Readings from Last 3 Encounters:  12/25/20 221 lb 12.8 oz (100.6 kg)  10/25/20 220 lb 12.8 oz (100.2 kg)  07/01/20 212 lb (96.2 kg)     GEN:  Well nourished, well developed in no acute distress HEENT: Normal NECK: No JVD; No carotid bruits LYMPHATICS: No lymphadenopathy CARDIAC: RRR, no murmurs, rubs, gallops RESPIRATORY:  Clear to auscultation without rales, wheezing or rhonchi  ABDOMEN: Soft, non-tender, non-distended MUSCULOSKELETAL:  No edema; No deformity  SKIN: Warm and dry NEUROLOGIC:  Alert and oriented x 3 PSYCHIATRIC:  Normal affect   ASSESSMENT:    1. Long-term use of high-risk medication   2. Chronic systolic heart failure ([place])   3. Shortness of breath    PLAN:    In order of problems listed above:  Chronic systolic heart failure --NYHA 2 -Could not afford Entresto.  Too expensive.  Continuing to take losartan Toprol.  I will add spironolactone 12.5 mg a day. Check BMET 2 weeks.  -Has both LAD as well as circumflex stenting -EF has ranged from 35 to 45%. -Continues to have anginal symptoms with exertion. SOB --If not able to afford Entresto, would not be able to afford [place].  --Frustrated that symptoms did not improve following stenting.    Interstitial lung disease -Fibrotic lung disease noted on prior CT scan.  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  Reviewed Dr. Vaughan Browner note.  He does have remote asbestos exposure.  PFTs are well-preserved with no restriction or diffusion impairment.     Medication  Adjustments/Labs and Tests Ordered: Current medicines are reviewed at length with the patient today.  Concerns regarding medicines are outlined above.  Orders Placed This Encounter  Procedures  . Basic metabolic panel   Meds ordered this encounter  Medications  . spironolactone (ALDACTONE) 25 MG tablet    Sig: Take 0.5 tablets (12.5 mg total) by mouth daily.    Dispense:  45 tablet    Refill:  3    There are no Patient Instructions on file for this visit.   Signed, Candee Furbish, MD  12/25/2020 10:13 AM    [place]

## 2020-12-25 NOTE — Patient Instructions (Signed)
Medication Instructions:  Please start Spironolactone 25 mg 1/2 tablet daily. Continue all other medications as listed.  *If you need a refill on your cardiac medications before your next appointment, please call your pharmacy*  Lab Work: Please have blood work in 2 weeks (BMP)  If you have labs (blood work) drawn today and your tests are completely normal, you will receive your results only by: Marland Kitchen MyChart Message (if you have MyChart) OR . A paper copy in the mail If you have any lab test that is abnormal or we need to change your treatment, we will call you to review the results.  Follow-Up: At [facility] you and your health needs are our priority.  As part of our continuing mission to provide you with exceptional heart care, we have created designated Provider Care Teams.  These Care Teams include your primary Cardiologist (physician) and Advanced Practice Providers (APPs -  Physician Assistants and Nurse Practitioners) who all work together to provide you with the care you need, when you need it.  We recommend signing up for the patient portal called "MyChart".  Sign up information is provided on this After Visit Summary.  MyChart is used to connect with patients for Virtual Visits (Telemedicine).  Patients are able to view lab/test results, encounter notes, upcoming appointments, etc.  Non-urgent messages can be sent to your provider as well.   To learn more about what you can do with MyChart, go to [email or web address].    Your next appointment:   6 month(s)  The format for your next appointment:   In Person  Provider:   Candee Furbish, MD  Thank you for choosing [facility]!!

## 2021-01-08 ENCOUNTER — Other Ambulatory Visit: Payer: Medicare Other | Admitting: *Deleted

## 2021-01-08 ENCOUNTER — Other Ambulatory Visit: Payer: Self-pay

## 2021-01-08 DIAGNOSIS — I5022 Chronic systolic (congestive) heart failure: Secondary | ICD-10-CM

## 2021-01-08 DIAGNOSIS — R0602 Shortness of breath: Secondary | ICD-10-CM

## 2021-01-08 DIAGNOSIS — Z79899 Other long term (current) drug therapy: Secondary | ICD-10-CM

## 2021-01-08 LAB — BASIC METABOLIC PANEL
BUN/Creatinine Ratio: 15 (ref 10–24)
BUN: 14 mg/dL (ref 8–27)
CO2: 24 mmol/L (ref 20–29)
Calcium: 9.3 mg/dL (ref 8.6–10.2)
Chloride: 103 mmol/L (ref 96–106)
Creatinine, Ser: 0.95 mg/dL (ref 0.76–1.27)
Glucose: 123 mg/dL — ABNORMAL HIGH (ref 65–99)
Potassium: 4.3 mmol/L (ref 3.5–5.2)
Sodium: 142 mmol/L (ref 134–144)
eGFR: 87 mL/min/{1.73_m2} (ref >59)

## 2021-02-27 ENCOUNTER — Other Ambulatory Visit: Payer: Self-pay

## 2021-02-27 MED ORDER — SPIRONOLACTONE 25 MG PO TABS: 12.5000 mg | ORAL_TABLET | Freq: Every day | ORAL | 3 refills | Status: DC

## 2021-03-28 ENCOUNTER — Other Ambulatory Visit: Payer: Self-pay | Admitting: Cardiology

## 2021-05-29 ENCOUNTER — Encounter: Payer: Self-pay | Admitting: Pulmonary Disease

## 2021-05-29 ENCOUNTER — Other Ambulatory Visit: Payer: Self-pay

## 2021-05-29 ENCOUNTER — Ambulatory Visit (INDEPENDENT_AMBULATORY_CARE_PROVIDER_SITE_OTHER): Payer: Medicare Other | Admitting: Pulmonary Disease

## 2021-05-29 VITALS — BP 132/68 | HR 72 | Temp 97.5°F | Ht 71.0 in | Wt 218.0 lb

## 2021-05-29 DIAGNOSIS — J849 Interstitial pulmonary disease, unspecified: Secondary | ICD-10-CM

## 2021-05-29 LAB — PULMONARY FUNCTION TEST
DL/VA % pred: 102 %
DL/VA: 4.17 ml/min/mmHg/L
DLCO cor % pred: 89 %
DLCO cor: 23.47 ml/min/mmHg
DLCO unc % pred: 89 %
DLCO unc: 23.47 ml/min/mmHg
FEF 25-75 Post: 2.93 L/s
FEF 25-75 Pre: 2.15 L/s
FEF2575-%Change-Post: 36 %
FEF2575-%Pred-Post: 116 %
FEF2575-%Pred-Pre: 85 %
FEV1-%Change-Post: 8 %
FEV1-%Pred-Post: 85 %
FEV1-%Pred-Pre: 78 %
FEV1-Post: 2.8 L
FEV1-Pre: 2.58 L
FEV1FVC-%Change-Post: 2 %
FEV1FVC-%Pred-Pre: 104 %
FEV6-%Change-Post: 5 %
FEV6-%Pred-Post: 84 %
FEV6-%Pred-Pre: 79 %
FEV6-Post: 3.55 L
FEV6-Pre: 3.35 L
FEV6FVC-%Change-Post: 0 %
FEV6FVC-%Pred-Post: 105 %
FEV6FVC-%Pred-Pre: 105 %
FVC-%Change-Post: 5 %
FVC-%Pred-Post: 79 %
FVC-%Pred-Pre: 75 %
FVC-Post: 3.56 L
FVC-Pre: 3.36 L
Post FEV1/FVC ratio: 79 %
Post FEV6/FVC ratio: 100 %
Pre FEV1/FVC ratio: 77 %
Pre FEV6/FVC Ratio: 100 %
RV % pred: 101 %
RV: 2.45 L
TLC % pred: 83 %
TLC: 5.84 L

## 2021-05-29 NOTE — Patient Instructions (Signed)
Full PFT performed today. °

## 2021-05-29 NOTE — Addendum Note (Signed)
Addended by: Elton Sin on: 05/29/2021 11:51 AM   Modules accepted: Orders

## 2021-05-29 NOTE — Patient Instructions (Signed)
I have reviewed your PFTs which show stable lung function I am glad you are doing well with no change in symptoms  We will continue to monitor your lungs Order follow-up high-resolution CT in 6 months and clinic visit in 6 months

## 2021-05-29 NOTE — Progress Notes (Signed)
Full PFT performed today. °

## 2021-05-29 NOTE — Progress Notes (Signed)
Henry [name]    [id number]    July 08, 1951  Primary Care Physician:Pa, [place]  Referring Physician: Halina Maidens [facility] [address] [place],  [place] [address]  Chief complaint: Follow-up for interstitial lung disease  HPI: 70 year old ex-smoker referred for evaluation of interstitial lung disease.  He has history of hilar mass found in June 2020.  Underwent bronchoscopy and August 2020 with negative results for malignancy.  One of the biopsies did show granulomatous tissue..  Developed pneumomediastinum and was admitted briefly to the hospital.  He has done well since that time with follow-up scan showing improvement in the mass which is thought to be inflammatory in nature. There were additional findings of fibrotic lung disease and he has been referred to the ILD clinic for further evaluation. Additional medical history includes hypertension, acid reflux, coronary artery disease  Has mild dyspnea on exertion which is progressive in nature.  Denies any cough, sputum production, wheezing.  Pets: Has dogs, no birds Occupation: Nature conservation officer.   Exposures: Has brief history of asbestos exposure while working in [employer] in 1970s.  No mold, hot tub, Jacuzzi.  No feather pillows or comforter. Smoking history: 20-pack-year smoker.  Quit in 1995 Travel history: Originally from [place].  No significant recent travel Relevant family history: No family history of lung disease  Interim history: Continues to do well in terms of symptoms with no issues He is here for review of PFTs  Outpatient Encounter Medications as of 05/29/2021  Medication Sig   acetaminophen (TYLENOL) 325 MG tablet Take 650 mg by mouth every 6 (six) hours as needed (FOR PAIN).   Ascorbic Acid (VITAMIN C) 1000 MG tablet Take 1,000 mg by mouth daily.   clopidogrel (PLAVIX) 75 MG tablet Take 1 tablet (75 mg total) by mouth daily with breakfast.   losartan (COZAAR) 100 MG  tablet Take 1 tablet (100 mg total) by mouth daily.   metoprolol succinate (TOPROL-XL) 50 MG 24 hr tablet Take 50 mg by mouth daily.   nitroGLYCERIN (NITROSTAT) 0.4 MG SL tablet DISSOLVE ONE TABLET UNDER THE TONGUE EVERY 5 MINUTES AS NEEDED FOR CHEST PAIN.  DO NOT EXCEED A TOTAL OF 3 DOSES IN 15 MINUTES   pantoprazole (PROTONIX) 40 MG tablet TAKE 1 TABLET BY MOUTH ONCE DAILY BEFORE BREAKFAST   rosuvastatin (CRESTOR) 10 MG tablet Take 1 tablet (10 mg total) by mouth daily.   spironolactone (ALDACTONE) 25 MG tablet Take 0.5 tablets (12.5 mg total) by mouth daily.   No facility-administered encounter medications on file as of 05/29/2021.    Physical Exam: Blood pressure 132/68, pulse 72, temperature (!) 97.5 F (36.4 C), temperature source Oral, height '5\' 11"'$  (1.803 m), weight 218 lb (98.9 kg), SpO2 98 %. Gen:      No acute distress HEENT:  EOMI, sclera anicteric Neck:     No masses; no thyromegaly Lungs:    Clear to auscultation bilaterally; normal respiratory effort CV:         Regular rate and rhythm; no murmurs Abd:      + bowel sounds; soft, non-tender; no palpable masses, no distension Ext:    No edema; adequate peripheral perfusion Skin:      Warm and dry; no rash Neuro: alert and oriented x 3 Psych: normal mood and affect   Data Reviewed: Imaging: High-resolution CT 09/26/2020- basilar predominant fibrotic interstitial lung disease.  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  No ongoing exposures or symptoms of connective tissue disease CTD serologies are negative  We reviewed PFTs today which are stable As symptomatically he continues to be stable we will observe off antifibrotic's unless there is progression of disease down the line  Plan/Recommendations: High-res CT in 6 months  Marshell Garfinkel MD [facility] 05/29/2021, 11:37 AM  CC: Pa, [facility]*

## 2021-06-16 ENCOUNTER — Other Ambulatory Visit: Payer: Self-pay | Admitting: Cardiology

## 2021-07-01 ENCOUNTER — Other Ambulatory Visit: Payer: Self-pay

## 2021-07-01 ENCOUNTER — Ambulatory Visit (INDEPENDENT_AMBULATORY_CARE_PROVIDER_SITE_OTHER): Payer: Medicare Other | Admitting: Cardiology

## 2021-07-01 ENCOUNTER — Encounter: Payer: Self-pay | Admitting: Cardiology

## 2021-07-01 VITALS — BP 118/62 | HR 67 | Ht 71.0 in | Wt 217.8 lb

## 2021-07-01 DIAGNOSIS — R918 Other nonspecific abnormal finding of lung field: Secondary | ICD-10-CM | POA: Diagnosis not present

## 2021-07-01 DIAGNOSIS — I2510 Atherosclerotic heart disease of native coronary artery without angina pectoris: Secondary | ICD-10-CM

## 2021-07-01 DIAGNOSIS — I5022 Chronic systolic (congestive) heart failure: Secondary | ICD-10-CM | POA: Diagnosis not present
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
## 2021-07-01 NOTE — Assessment & Plan Note (Signed)
Followed by Dr. Vaughan Browner.  No changes made.  Has basilar interstitial lung fibrosis.

## 2021-07-01 NOTE — Assessment & Plan Note (Signed)
Prior stents placed to the LAD and circumflex.  Overall doing well.  Continuing with Plavix monotherapy.  No bleeding.  Hemoglobin 14, creatinine 0.95.

## 2021-07-01 NOTE — Assessment & Plan Note (Signed)
In the past has tried [place] but could not afford it.  Would also not be able to afford SGLT2 inhibitor.  Continue with Toprol-XL 50 mg once a day.  Has underlying left bundle branch block.  Also continue with spironolactone 12.5 mg once a day.  Prior potassium 4.3.  In general frustrated that his symptoms did not dramatically improve following stenting.

## 2021-07-01 NOTE — Patient Instructions (Signed)
Medication Instructions:  The current medical regimen is effective;  continue present plan and medications.  *If you need a refill on your cardiac medications before your next appointment, please call your pharmacy*  Follow-Up: At [facility], you and your health needs are our priority.  As part of our continuing mission to provide you with exceptional heart care, we have created designated Provider Care Teams.  These Care Teams include your primary Cardiologist (physician) and Advanced Practice Providers (APPs -  Physician Assistants and Nurse Practitioners) who all work together to provide you with the care you need, when you need it.  We recommend signing up for the patient portal called "MyChart".  Sign up information is provided on this After Visit Summary.  MyChart is used to connect with patients for Virtual Visits (Telemedicine).  Patients are able to view lab/test results, encounter notes, upcoming appointments, etc.  Non-urgent messages can be sent to your provider as well.   To learn more about what you can do with MyChart, go to [email or web address].com.    Your next appointment:   1 year(s)  The format for your next appointment:   In Person  Provider:   Mark Skains, MD   Thank you for choosing [facility]!!    

## 2021-09-03 ENCOUNTER — Other Ambulatory Visit: Payer: Self-pay | Admitting: Cardiology

## 2021-09-10 ENCOUNTER — Other Ambulatory Visit: Payer: Self-pay | Admitting: Cardiology

## 2021-11-07 ENCOUNTER — Other Ambulatory Visit: Payer: Medicare Other

## 2021-12-01 ENCOUNTER — Other Ambulatory Visit: Payer: Self-pay

## 2021-12-01 ENCOUNTER — Ambulatory Visit
Admission: RE | Admit: 2021-12-01 | Discharge: 2021-12-01 | Disposition: A | Discharge status: Home / Self Care | Payer: Medicare Other | Source: Ambulatory Visit | Attending: Pulmonary Disease | Admitting: Pulmonary Disease

## 2021-12-01 DIAGNOSIS — J849 Interstitial pulmonary disease, unspecified: Secondary | ICD-10-CM

## 2021-12-17 ENCOUNTER — Other Ambulatory Visit: Payer: Self-pay | Admitting: *Deleted

## 2021-12-17 DIAGNOSIS — J849 Interstitial pulmonary disease, unspecified: Secondary | ICD-10-CM

## 2022-02-19 ENCOUNTER — Other Ambulatory Visit: Payer: Self-pay | Admitting: Cardiology

## 2022-04-21 ENCOUNTER — Other Ambulatory Visit: Payer: Self-pay | Admitting: Cardiology

## 2022-06-05 ENCOUNTER — Other Ambulatory Visit: Payer: Self-pay | Admitting: *Deleted

## 2022-06-09 ENCOUNTER — Telehealth: Payer: Self-pay

## 2022-06-09 MED ORDER — METOPROLOL SUCCINATE ER 50 MG PO TB24: 50.0000 mg | ORAL_TABLET | Freq: Every day | ORAL | 0 refills | Status: DC

## 2022-06-09 NOTE — Telephone Encounter (Signed)
Received a call from the patient stating he called last week and was follow up because he has not heard back from our office. He states the provider who used to prescribe his metoprolol has retired and he wanted to know if Dr Marlou Porch' would be willing to refill his prescriptions going forward. The patient states he is currently out of medication and would like a refill.   He is requesting a 90 day supply to [facility] and a 2 week supply to [facility] on [address].

## 2022-06-09 NOTE — Telephone Encounter (Signed)
Pt is due for follow up appt with Dr Marlou Porch in September.  Sent #14 tablets into [facility] as requested with a message for pt to contact the office to schedule a September appt with Dr Marlou Porch. #90 once sent into [facility].

## 2022-06-25 ENCOUNTER — Other Ambulatory Visit: Payer: Self-pay | Admitting: Cardiology

## 2022-07-27 ENCOUNTER — Encounter: Payer: Self-pay | Admitting: Cardiology

## 2022-07-27 ENCOUNTER — Ambulatory Visit: Payer: Medicare Other | Attending: Cardiology | Admitting: Cardiology

## 2022-07-27 VITALS — BP 130/60 | HR 72 | Ht 71.0 in | Wt 206.0 lb

## 2022-07-27 DIAGNOSIS — R0602 Shortness of breath: Secondary | ICD-10-CM | POA: Diagnosis present

## 2022-07-27 DIAGNOSIS — I5022 Chronic systolic (congestive) heart failure: Secondary | ICD-10-CM | POA: Insufficient documentation

## 2022-07-27 DIAGNOSIS — I2510 Atherosclerotic heart disease of native coronary artery without angina pectoris: Secondary | ICD-10-CM | POA: Diagnosis not present

## 2022-07-27 MED ORDER — SPIRONOLACTONE 25 MG PO TABS: 12.5000 mg | ORAL_TABLET | Freq: Every day | ORAL | 1 refills | Status: DC

## 2022-07-27 MED ORDER — METOPROLOL SUCCINATE ER 50 MG PO TB24: 50.0000 mg | ORAL_TABLET | Freq: Every day | ORAL | 3 refills | Status: DC

## 2022-07-27 MED ORDER — LOSARTAN POTASSIUM 100 MG PO TABS: 100.0000 mg | ORAL_TABLET | Freq: Every day | ORAL | 3 refills | Status: DC

## 2022-07-27 MED ORDER — CLOPIDOGREL BISULFATE 75 MG PO TABS: 75.0000 mg | ORAL_TABLET | Freq: Every day | ORAL | 3 refills | Status: DC

## 2022-07-27 MED ORDER — ISOSORBIDE MONONITRATE ER 30 MG PO TB24: 30.0000 mg | ORAL_TABLET | Freq: Every day | ORAL | 3 refills | Status: DC

## 2022-07-27 MED ORDER — ROSUVASTATIN CALCIUM 10 MG PO TABS: 10.0000 mg | ORAL_TABLET | Freq: Every day | ORAL | 3 refills | Status: DC
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
## 2022-07-27 NOTE — Patient Instructions (Signed)
Medication Instructions:  Please start Isosorbide 30 mg once a day. Continue all other medications as listed.  *If you need a refill on your cardiac medications before your next appointment, please call your pharmacy*  Follow-Up: At [facility], you and your health needs are our priority.  As part of our continuing mission to provide you with exceptional heart care, we have created designated Provider Care Teams.  These Care Teams include your primary Cardiologist (physician) and Advanced Practice Providers (APPs -  Physician Assistants and Nurse Practitioners) who all work together to provide you with the care you need, when you need it.  We recommend signing up for the patient portal called "MyChart".  Sign up information is provided on this After Visit Summary.  MyChart is used to connect with patients for Virtual Visits (Telemedicine).  Patients are able to view lab/test results, encounter notes, upcoming appointments, etc.  Non-urgent messages can be sent to your provider as well.   To learn more about what you can do with MyChart, go to [email or web address].    Your next appointment:   1 year(s)  The format for your next appointment:   In Person  Provider:   Candee Furbish, MD      Important Information About Sugar

## 2022-08-17 ENCOUNTER — Other Ambulatory Visit: Payer: Self-pay | Admitting: Cardiology

## 2022-12-02 ENCOUNTER — Ambulatory Visit
Admission: RE | Admit: 2022-12-02 | Discharge: 2022-12-02 | Disposition: A | Discharge status: Home / Self Care | Payer: Medicare Other | Source: Ambulatory Visit | Attending: Pulmonary Disease | Admitting: Pulmonary Disease

## 2022-12-02 DIAGNOSIS — J849 Interstitial pulmonary disease, unspecified: Secondary | ICD-10-CM

## 2022-12-25 ENCOUNTER — Encounter: Payer: Self-pay | Admitting: *Deleted

## 2023-01-25 ENCOUNTER — Other Ambulatory Visit: Payer: Self-pay

## 2023-01-25 MED ORDER — NITROGLYCERIN 0.4 MG SL SUBL: SUBLINGUAL_TABLET | SUBLINGUAL | 6 refills | Status: DC

## 2023-05-07 ENCOUNTER — Other Ambulatory Visit: Payer: Self-pay

## 2023-05-07 MED ORDER — SPIRONOLACTONE 25 MG PO TABS: 12.5000 mg | ORAL_TABLET | Freq: Every day | ORAL | 0 refills | Status: DC

## 2023-06-05 ENCOUNTER — Other Ambulatory Visit: Payer: Self-pay | Admitting: Cardiology

## 2023-06-16 ENCOUNTER — Other Ambulatory Visit: Payer: Self-pay | Admitting: Cardiology

## 2023-06-21 ENCOUNTER — Ambulatory Visit (INDEPENDENT_AMBULATORY_CARE_PROVIDER_SITE_OTHER): Payer: Medicare Other | Admitting: Pulmonary Disease

## 2023-06-21 ENCOUNTER — Encounter: Payer: Self-pay | Admitting: Pulmonary Disease

## 2023-06-21 VITALS — BP 130/62 | HR 74 | Temp 97.2°F | Ht 70.0 in | Wt 210.0 lb

## 2023-06-21 DIAGNOSIS — R0602 Shortness of breath: Secondary | ICD-10-CM

## 2023-06-21 DIAGNOSIS — J849 Interstitial pulmonary disease, unspecified: Secondary | ICD-10-CM

## 2023-06-21 NOTE — Patient Instructions (Signed)
Will order high-res CT in 6 months and PFTs in 6 months Return to clinic after 6 months

## 2023-06-21 NOTE — Progress Notes (Signed)
Henry [name]    [id number]    Jul 30, 1951  Primary Care Physician:Pa, [facility]  Referring Physician: Carmon Ginsberg [facility] [address] [place],  [place] [address]  Chief complaint: Follow-up for interstitial lung disease  HPI: 72 year old ex-smoker referred for evaluation of interstitial lung disease.  He has history of hilar mass found in June 2020.  Underwent bronchoscopy and August 2020 with negative results for malignancy.  One of the biopsies did show granulomatous tissue..  Developed pneumomediastinum and was admitted briefly to the hospital.  He has done well since that time with follow-up scan showing improvement in the mass which is thought to be inflammatory in nature. There were additional findings of fibrotic lung disease and he has been referred to the ILD clinic for further evaluation. Additional medical history includes hypertension, acid reflux, coronary artery disease  Has mild dyspnea on exertion which is progressive in nature.  Denies any cough, sputum production, wheezing.  Pets: Has dogs, no birds Occupation: Corporate investment banker.   Exposures: Has brief history of asbestos exposure while working in [employer] in 1970s.  No mold, hot tub, Jacuzzi.  No feather pillows or comforter. Smoking history: 20-pack-year smoker.  Quit in 1995 Travel history: Originally from [place].  No significant recent travel Relevant family history: No family history of lung disease  Interim history: Continues to do well in terms of symptoms with no issues Complaining of shortness of breath when eating but not on exertion.  Outpatient Encounter Medications as of 06/21/2023  Medication Sig   acetaminophen (TYLENOL) 325 MG tablet Take 650 mg by mouth every 6 (six) hours as needed (FOR PAIN).   Ascorbic Acid (VITAMIN C) 1000 MG tablet Take 1,000 mg by mouth daily.   clopidogrel (PLAVIX) 75 MG tablet Take 1 tablet (75 mg total) by mouth daily with  breakfast.   isosorbide mononitrate (IMDUR) 30 MG 24 hr tablet Take 1 tablet (30 mg total) by mouth daily.   losartan (COZAAR) 100 MG tablet Take 1 tablet (100 mg total) by mouth daily. Please call [phone] to schedule an appointment for future refills. Thank you.   metoprolol succinate (TOPROL-XL) 50 MG 24 hr tablet Take 1 tablet (50 mg total) by mouth daily.   nitroGLYCERIN (NITROSTAT) 0.4 MG SL tablet DISSOLVE ONE TABLET UNDER THE TONGUE EVERY 5 MINUTES AS NEEDED FOR CHEST PAIN.  DO NOT EXCEED A TOTAL OF 3 DOSES IN 15 MINUTES   pantoprazole (PROTONIX) 40 MG tablet TAKE 1 TABLET EVERY DAY BEFORE BREAKFAST   rosuvastatin (CRESTOR) 10 MG tablet Take 1 tablet (10 mg total) by mouth daily.   spironolactone (ALDACTONE) 25 MG tablet Take 0.5 tablets (12.5 mg total) by mouth daily. Please call [phone] to schedule an appointment for future refills. Thank you.   No facility-administered encounter medications on file as of 06/21/2023.    Physical Exam: Blood pressure 130/62, pulse 74, temperature (!) 97.2 F (36.2 C), temperature source Temporal, height 5\' 10"  (1.778 m), weight 210 lb (95.3 kg), SpO2 98%. Gen:      No acute distress HEENT:  EOMI, sclera anicteric Neck:     No masses; no thyromegaly Lungs:    Clear to auscultation bilaterally; normal respiratory effort CV:         Regular rate and rhythm; no murmurs Abd:      + bowel sounds; soft, non-tender; no palpable masses, no distension Ext:    No edema; adequate peripheral perfusion  Skin:      Warm and dry; no rash Neuro: alert and oriented x 3 Psych: normal mood and affect   Data Reviewed: Imaging: High-resolution CT 09/26/2020- basilar predominant fibrotic interstitial lung disease.  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 I have reviewed the images personally.  PFTs: 06/12/2019 FVC 3.51 [77%], FEV1 2.81 [83%], F/F 80, TLC 5.59 [79%], DLCO 28.57  [108%] Normal test  05/19/2021 FVC 2.56 [110%], FEV1 2.80 [85%], F/F 79, TLC 5.84 [83%], DLCO 23.47 [89%] Normal test  Labs: CTD serologies 1/40/22-negative  Assessment:  Evaluation for interstitial lung disease CT scan reviewed with probable UIP pattern changes.  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  No ongoing exposures or symptoms of connective tissue disease CTD serologies are negative  We discussed antifibrotic therapy but he does not want to take any additional medication since he is feeling well As symptomatically he continues to be stable we will observe off antifibrotic's unless there is progression of disease down the line Repeat high-res CT and PFTs in 6 months  Plan/Recommendations: High-res CT, PFTs in 6 months  Chilton Greathouse MD [facility] 06/21/2023, 8:40 AM  CC: Pa, [facility]*

## 2023-06-30 LAB — LAB REPORT - SCANNED: EGFR: 85

## 2023-07-07 ENCOUNTER — Other Ambulatory Visit: Payer: Self-pay | Admitting: Cardiology

## 2023-07-19 ENCOUNTER — Other Ambulatory Visit: Payer: Self-pay | Admitting: Cardiology

## 2023-07-31 ENCOUNTER — Other Ambulatory Visit: Payer: Self-pay | Admitting: Cardiology

## 2023-08-23 ENCOUNTER — Other Ambulatory Visit: Payer: Self-pay | Admitting: Cardiology

## 2023-09-15 ENCOUNTER — Other Ambulatory Visit: Payer: Self-pay | Admitting: Cardiology

## 2023-09-19 ENCOUNTER — Other Ambulatory Visit: Payer: Self-pay | Admitting: Cardiology

## 2023-10-09 ENCOUNTER — Other Ambulatory Visit: Payer: Self-pay | Admitting: Cardiology

## 2023-10-19 ENCOUNTER — Other Ambulatory Visit: Payer: Self-pay | Admitting: Cardiology

## 2023-11-18 ENCOUNTER — Other Ambulatory Visit: Payer: Self-pay | Admitting: Cardiology

## 2023-12-02 ENCOUNTER — Other Ambulatory Visit: Payer: Self-pay | Admitting: Cardiology

## 2023-12-09 ENCOUNTER — Other Ambulatory Visit: Payer: Self-pay | Admitting: Cardiology

## 2023-12-20 ENCOUNTER — Ambulatory Visit
Admission: RE | Admit: 2023-12-20 | Discharge: 2023-12-20 | Disposition: A | Discharge status: Home / Self Care | Payer: Medicare Other | Source: Ambulatory Visit | Attending: Pulmonary Disease

## 2023-12-20 DIAGNOSIS — J849 Interstitial pulmonary disease, unspecified: Secondary | ICD-10-CM

## 2023-12-26 ENCOUNTER — Other Ambulatory Visit: Payer: Self-pay | Admitting: Cardiology

## 2024-01-05 ENCOUNTER — Telehealth: Payer: Self-pay | Admitting: *Deleted

## 2024-01-05 NOTE — Telephone Encounter (Signed)
   Pre-operative Risk Assessment    Patient Name: Henry [name]  DOB: [birth year] MRN: [id number]   Date of last office visit: 06-2021  Date of next office visit: 07-2023   Request for Surgical Clearance    Procedure:   LEFT ANKLE BIMALLEOLAR FRACTURE   Date of Surgery:   ASAP AFTER  CARDIAC CLEARANCE                               Surgeon:  DR Jonny Ruiz HEWITT Surgeon's Group or Practice Name:  [facility]  Phone number:  [phone] Fax number:  [phone] MEGAN DAVIS    Type of Clearance Requested:   - Pharmacy:  Hold Clopidogrel (Plavix) AND HOW MANY DAY APPROPRIATE TO HOLD     Type of Anesthesia:  General     SignedOleta Mouse   01/05/2024, 4:37 PM

## 2024-01-06 ENCOUNTER — Other Ambulatory Visit: Payer: Self-pay

## 2024-01-06 ENCOUNTER — Other Ambulatory Visit: Payer: Self-pay | Admitting: Orthopedic Surgery

## 2024-01-06 ENCOUNTER — Encounter (HOSPITAL_BASED_OUTPATIENT_CLINIC_OR_DEPARTMENT_OTHER): Payer: Self-pay | Admitting: Orthopedic Surgery

## 2024-01-06 NOTE — Telephone Encounter (Signed)
   Name: Henry [name]  DOB: July 21, 1951  MRN: [id number]  Primary Cardiologist: Donato Schultz, MD  Chart reviewed as part of pre-operative protocol coverage. Because of Edel Rivero Adriance's past medical history and time since last visit, he will require a follow-up in-office visit in order to better assess preoperative cardiovascular risk.  Pre-op covering staff: - Please schedule appointment and call patient to inform them. If patient already had an upcoming appointment within acceptable timeframe, please add "pre-op clearance" to the appointment notes so provider is aware. - Please contact requesting surgeon's office via preferred method (i.e, phone, fax) to inform them of need for appointment prior to surgery.  As long as no new symptoms at the time of office visit, can hold Plavix x 5 days prior to procedure.  Would prefer aspirin in the interim if possible unless bleeding risk is too high.  Sharlene Dory, PA-C  01/06/2024, 8:39 AM

## 2024-01-06 NOTE — Telephone Encounter (Signed)
 Pt has been scheduled to see Dr. Anne Fu, 01/07/24, clearance will be addressed at that time.  Will route to the requesting surgeon's office to make them aware.

## 2024-01-07 ENCOUNTER — Encounter: Payer: Self-pay | Admitting: Cardiology

## 2024-01-07 ENCOUNTER — Encounter (HOSPITAL_BASED_OUTPATIENT_CLINIC_OR_DEPARTMENT_OTHER)
Admission: RE | Admit: 2024-01-07 | Discharge: 2024-01-07 | Disposition: A | Discharge status: Home / Self Care | Source: Ambulatory Visit | Attending: Orthopedic Surgery | Admitting: Orthopedic Surgery

## 2024-01-07 ENCOUNTER — Ambulatory Visit (INDEPENDENT_AMBULATORY_CARE_PROVIDER_SITE_OTHER): Admitting: Cardiology

## 2024-01-07 ENCOUNTER — Other Ambulatory Visit: Payer: Self-pay | Admitting: Cardiology

## 2024-01-07 VITALS — BP 140/68 | HR 65 | Ht 70.0 in | Wt 215.2 lb

## 2024-01-07 DIAGNOSIS — R918 Other nonspecific abnormal finding of lung field: Secondary | ICD-10-CM | POA: Insufficient documentation

## 2024-01-07 DIAGNOSIS — I5022 Chronic systolic (congestive) heart failure: Secondary | ICD-10-CM | POA: Insufficient documentation

## 2024-01-07 DIAGNOSIS — Z7902 Long term (current) use of antithrombotics/antiplatelets: Secondary | ICD-10-CM | POA: Insufficient documentation

## 2024-01-07 DIAGNOSIS — E119 Type 2 diabetes mellitus without complications: Secondary | ICD-10-CM

## 2024-01-07 DIAGNOSIS — S82892A Other fracture of left lower leg, initial encounter for closed fracture: Secondary | ICD-10-CM | POA: Diagnosis not present

## 2024-01-07 DIAGNOSIS — R0602 Shortness of breath: Secondary | ICD-10-CM | POA: Insufficient documentation

## 2024-01-07 DIAGNOSIS — W12XXXA Fall on and from scaffolding, initial encounter: Secondary | ICD-10-CM | POA: Insufficient documentation

## 2024-01-07 DIAGNOSIS — I2510 Atherosclerotic heart disease of native coronary artery without angina pectoris: Secondary | ICD-10-CM | POA: Diagnosis not present

## 2024-01-07 DIAGNOSIS — Z79899 Other long term (current) drug therapy: Secondary | ICD-10-CM | POA: Diagnosis not present

## 2024-01-07 DIAGNOSIS — Z01810 Encounter for preprocedural cardiovascular examination: Secondary | ICD-10-CM | POA: Insufficient documentation

## 2024-01-07 DIAGNOSIS — I10 Essential (primary) hypertension: Secondary | ICD-10-CM | POA: Insufficient documentation

## 2024-01-07 DIAGNOSIS — I447 Left bundle-branch block, unspecified: Secondary | ICD-10-CM | POA: Diagnosis not present

## 2024-01-07 DIAGNOSIS — I110 Hypertensive heart disease with heart failure: Secondary | ICD-10-CM | POA: Diagnosis not present

## 2024-01-07 DIAGNOSIS — Z955 Presence of coronary angioplasty implant and graft: Secondary | ICD-10-CM | POA: Insufficient documentation

## 2024-01-07 DIAGNOSIS — J8410 Pulmonary fibrosis, unspecified: Secondary | ICD-10-CM | POA: Diagnosis not present

## 2024-01-07 LAB — BASIC METABOLIC PANEL WITH GFR
Anion gap: 8 (ref 5–15)
BUN: 12 mg/dL (ref 8–23)
CO2: 27 mmol/L (ref 22–32)
Calcium: 9.1 mg/dL (ref 8.9–10.3)
Chloride: 106 mmol/L (ref 98–111)
Creatinine, Ser: 0.87 mg/dL (ref 0.61–1.24)
GFR, Estimated: >60 mL/min (ref >60)
Glucose, Bld: 110 mg/dL — ABNORMAL HIGH (ref 70–99)
Potassium: 4.3 mmol/L (ref 3.5–5.1)
Sodium: 141 mmol/L (ref 135–145)

## 2024-01-07 MED ORDER — ROSUVASTATIN CALCIUM 10 MG PO TABS: 10.0000 mg | ORAL_TABLET | Freq: Every day | ORAL | 3 refills | Status: DC

## 2024-01-07 MED ORDER — NITROGLYCERIN 0.4 MG SL SUBL: SUBLINGUAL_TABLET | SUBLINGUAL | 4 refills | Status: AC

## 2024-01-07 MED ORDER — CLOPIDOGREL BISULFATE 75 MG PO TABS: 75.0000 mg | ORAL_TABLET | Freq: Every day | ORAL | 3 refills | Status: DC

## 2024-01-07 MED ORDER — SPIRONOLACTONE 25 MG PO TABS: 12.5000 mg | ORAL_TABLET | Freq: Every day | ORAL | 3 refills | Status: AC

## 2024-01-07 MED ORDER — LOSARTAN POTASSIUM 100 MG PO TABS: 100.0000 mg | ORAL_TABLET | Freq: Every day | ORAL | 3 refills | Status: DC

## 2024-01-07 MED ORDER — ISOSORBIDE MONONITRATE ER 30 MG PO TB24: 30.0000 mg | ORAL_TABLET | Freq: Every day | ORAL | 3 refills | Status: DC

## 2024-01-07 MED ORDER — METOPROLOL SUCCINATE ER 50 MG PO TB24
50.0000 mg | ORAL_TABLET | Freq: Every day | ORAL | 3 refills | Status: AC
Start: 2024-01-07 — End: ?

## 2024-01-07 NOTE — Progress Notes (Signed)
 Cardiology Office Note:  .   Date:  01/07/2024  ID:  Henry [name], DOB [birth year], MRN [id number] PCP: [facility]  [facility] Providers Cardiologist:  Donato Schultz, MD     History of Present Illness: .   Henry [name] is a 73 y.o. male Discussed the use of AI scribe software for clinical note transcription with the patient, who gave verbal consent to proceed.  History of Present Illness Henry [name] is a 73 year old male with coronary artery disease and chronic systolic heart failure who presents for preoperative cardiac risk assessment.  He is undergoing evaluation prior to surgery for a left ankle fracture sustained after falling from scaffolding. 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 He is currently instructed to hold clopidogrel prior to the surgery.  No orthopnea, no CP. Mild SOB with activity. Stable.   He has a history of coronary artery disease with stents placed in the left anterior descending artery and circumflex artery. His last cardiac catheterization was in 2020, during which two stents were placed in the circumflex artery. An echocardiogram in 2021 showed an ejection fraction of 40-45%. He is on clopidogrel 75 mg daily, isosorbide 30 mg daily, losartan 100 mg daily, metoprolol succinate 50 mg daily, rosuvastatin 10 mg daily, and spironolactone 12.5 mg daily. He previously tried Chief Financial Officer and an SGLT2 inhibitor for his chronic systolic heart failure but could not afford them.  He has a history of intermittent left bundle branch block, which was observed on today's EKG. He experiences mild shortness of breath during physical activity, but it has not worsened since his last heart catheterization in 2020. No recent chest pain or nocturnal symptoms.  He also has a hilar mass and lung fibrosis, which are being monitored by pulmonary specialists.  Will refill meds.   Studies Reviewed: Marland Kitchen   EKG  Interpretation Date/Time:  Friday January 07 2024 09:26:09 EDT Ventricular Rate:  65 PR Interval:  174 QRS Duration:  190 QT Interval:  472 QTC Calculation: 490 R Axis:   -48  Text Interpretation: Normal sinus rhythm Left bundle branch block When compared with ECG of 04-Sep-2019 10:37, PR interval has decreased Left bundle branch block is now Present Criteria for Inferior infarct are no longer Present Confirmed by Donato Schultz ([id number]) on 01/07/2024 9:29:21 AM    Results DIAGNOSTIC Echocardiogram: EF 40-45% (2021) Cardiac catheterization: Circumflex stent x2 (2020) EKG: Left bundle branch block (01/07/2024) Risk Assessment/Calculations:           Physical Exam:   VS:  BP (!) 140/68   Pulse 65   Ht 5\' 10"  (1.778 m)   Wt 215 lb 3.2 oz (97.6 kg)   SpO2 98%   BMI 30.88 kg/m    Wt Readings from Last 3 Encounters:  01/06/24 210 lb (95.3 kg)  01/07/24 215 lb 3.2 oz (97.6 kg)  06/21/23 210 lb (95.3 kg)    GEN: Well nourished, well developed in no acute distress NECK: No JVD; No carotid bruits CARDIAC: RRR, no murmurs, no rubs, no gallops RESPIRATORY:  Clear to auscultation without rales, wheezing or rhonchi  ABDOMEN: Soft, non-tender, non-distended EXTREMITIES:  No edema; No deformity, Ankle fx wrapped  ASSESSMENT AND PLAN: .    Assessment and Plan Assessment & Plan Preoperative cardiac risk assessment He requires a preoperative cardiac risk assessment for left ankle fracture surgery. He has coronary artery disease with LAD and circumflex stents. EKG shows an intermittent left  bundle branch block, consistent with previous findings. He is on clopidogrel, which should be held 5 days prior to surgery to minimize bleeding risk. He is at low to moderate cardiac risk for surgery, with no contraindications. The stents are stable, allowing temporary cessation of clopidogrel. - Hold clopidogrel for 5 days prior to surgery - Resume clopidogrel the day after surgery - Proceed with surgery as  planned  Coronary artery disease He has coronary artery disease with LAD and circumflex stents. He reports no significant changes in symptoms such as chest pain or dyspnea since the last heart catheterization in 2020. The condition is well-managed. - Continue current cardiac medications  Chronic systolic heart failure He has chronic systolic heart failure with an ejection fraction of 40-45% per the 2021 echocardiogram. He has been unable to afford Entresto and SGLT2 inhibitors. The condition is well-managed with no recent exacerbations or significant symptom changes. - Refill cardiac medications as needed -NYHA 1-2 stable.   Left bundle branch block He has an intermittent left bundle branch block, consistent with previous findings on EKG, with no new concerns.  Follow-up He is advised to follow up in one year with an advanced practice provider for continued cardiac care. - Schedule follow-up appointment with APP in one year          Signed, Donato Schultz, MD

## 2024-01-07 NOTE — Patient Instructions (Signed)
 Medication Instructions:  The current medical regimen is effective;  continue present plan and medications.  *If you need a refill on your cardiac medications before your next appointment, please call your pharmacy*  Follow-Up: At [facility], you and your health needs are our priority.  As part of our continuing mission to provide you with exceptional heart care, our providers are all part of one team.  This team includes your primary Cardiologist (physician) and Advanced Practice Providers or APPs (Physician Assistants and Nurse Practitioners) who all work together to provide you with the care you need, when you need it.  Your next appointment:   1 year(s)  Provider:   Donato Schultz, MD    We recommend signing up for the patient portal called "MyChart".  Sign up information is provided on this After Visit Summary.  MyChart is used to connect with patients for Virtual Visits (Telemedicine).  Patients are able to view lab/test results, encounter notes, upcoming appointments, etc.  Non-urgent messages can be sent to your provider as well.   To learn more about what you can do with MyChart, go to [email or web address].   OK for surgery as planned.  May hold Plavix 5 days prior and reschedule once surgeon says it is safe.       1st Floor: - Lobby - Registration  - Pharmacy  - Lab - Cafe  2nd Floor: - PV Lab - Diagnostic Testing (echo, CT, nuclear med)  3rd Floor: - Vacant  4th Floor: - TCTS (cardiothoracic surgery) - AFib Clinic - Structural Heart Clinic - Vascular Surgery  - Vascular Ultrasound  5th Floor: - HeartCare Cardiology (general and EP) - Clinical Pharmacy for coumadin, hypertension, lipid, weight-loss medications, and med management appointments    Valet parking services will be available as well.

## 2024-01-07 NOTE — Progress Notes (Signed)

## 2024-01-12 ENCOUNTER — Encounter: Payer: Self-pay | Admitting: Pulmonary Disease

## 2024-01-12 ENCOUNTER — Ambulatory Visit: Payer: Medicare Other | Admitting: Pulmonary Disease

## 2024-01-12 VITALS — BP 150/71 | HR 69 | Temp 97.5°F | Ht 70.0 in | Wt 215.0 lb

## 2024-01-12 DIAGNOSIS — R0602 Shortness of breath: Secondary | ICD-10-CM

## 2024-01-12 DIAGNOSIS — J849 Interstitial pulmonary disease, unspecified: Secondary | ICD-10-CM | POA: Diagnosis not present

## 2024-01-12 NOTE — Patient Instructions (Signed)
 VISIT SUMMARY:  You came in today for a follow-up visit regarding your interstitial lung disease. We discussed your pulmonary fibrosis, suspected pulmonary hypertension, aortic dilatation, and coronary artery disease. Additionally, we reviewed your recent leg fracture and upcoming surgery.  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 Your condition is stable, and recent CT scans show no changes. We will continue to monitor it with annual CT scans, and no new therapy is needed at this time.  -PULMONARY HYPERTENSION (SUSPECTED): Pulmonary hypertension is high blood pressure in the arteries of your lungs. Although suspected, you have no symptoms, and previous tests were normal. No immediate action is required, but we will monitor for any symptoms.  -AORTIC DILATATION: Aortic dilatation is an enlargement of the aorta, the main artery in your body. Your aorta measures 4.2 cm and has not changed in size. We will continue to monitor it with annual CT scans, and no intervention is needed unless it reaches 5 cm.  -CORONARY ARTERY DISEASE: Coronary artery disease is a condition where the arteries supplying blood to your heart have plaque buildup. You have a history of stent placement and are being monitored by your cardiologist, Dr. Anne Fu. Continue following his recommendations.  INSTRUCTIONS:  You are scheduled for surgery on your left leg tomorrow. We will defer your breathing test until next year due to your upcoming surgery. Please cancel your current breathing test and reschedule it for next year. Additionally, schedule a follow-up appointment in one year.

## 2024-01-12 NOTE — Progress Notes (Signed)
 Henry [name]    [id number]    [birth year]  Primary Care Physician:[facility]  Referring Physician: [facility] [address] [place],  [place] [address]  Chief complaint: Follow-up for interstitial lung disease  HPI: 73 y.o.  ex-smoker referred for evaluation of interstitial lung disease.  He has history of hilar mass found in June 2020.  Underwent bronchoscopy and August 2020 with negative results for malignancy.  One of the biopsies did show granulomatous tissue..  Developed pneumomediastinum and was admitted briefly to the hospital.  He has done well since that time with follow-up scan showing improvement in the mass which is thought to be inflammatory in nature. There were additional findings of fibrotic lung disease and he has been referred to the ILD clinic for further evaluation. Additional medical history includes hypertension, acid reflux, coronary artery disease  Has mild dyspnea on exertion which is progressive in nature.  Denies any cough, sputum production, wheezing.  Pets: Has dogs, no birds Occupation: Corporate investment banker.   Exposures: Has brief history of asbestos exposure while working in [employer] in 1970s.  No mold, hot tub, Jacuzzi.  No feather pillows or comforter. Smoking history: 20-pack-year smoker.  Quit in 1995 Travel history: Originally from [place].  No significant recent travel Relevant family history: No family history of lung disease  Interim history: Discussed the use of AI scribe software for clinical note transcription with the patient, who gave verbal consent to proceed.  History of Present Illness Henry [name] is a 73 year old male with interstitial lung disease who presents for follow-up.  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 There are no changes in his breathing, and it remains consistent with previous years. He experiences shortness of breath with exertion but has not noticed  any worsening of symptoms. 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 CT scans have shown stability in the lung scarring. No new or worsening respiratory symptoms are reported.  There is a mention of possible pulmonary hypertension, but he has not experienced any symptoms related to this. Previous echocardiogram results were normal, and there is no immediate concern.  He has a history of aortic dilatation, measuring 4.2 cm, which has remained stable over time. He has not experienced any symptoms related to this condition.  He also has a history of coronary artery disease with previous stent placement. He denies any history of heart attacks and is under the care of a cardiologist who has been monitoring his condition.  Recently, he sustained a fracture to his left leg after a scaffolding accident at work, resulting in two broken bones and a pulled ankle bone. He is scheduled for surgery tomorrow. This is the second fracture to the same foot, with the first occurring seven months ago.    Outpatient Encounter Medications as of 01/12/2024  Medication Sig   acetaminophen (TYLENOL) 325 MG tablet Take 650 mg by mouth every 6 (six) hours as needed (FOR PAIN).   Ascorbic Acid (VITAMIN C) 1000 MG tablet Take 1,000 mg by mouth daily.   clopidogrel (PLAVIX) 75 MG tablet Take 1 tablet (75 mg total) by mouth daily.   isosorbide mononitrate (IMDUR) 30 MG 24 hr tablet Take 1 tablet (30 mg total) by mouth daily.   losartan (COZAAR) 100 MG tablet Take 1 tablet (100 mg total) by mouth daily.  metoprolol succinate (TOPROL-XL) 50 MG 24 hr tablet Take 1 tablet (50 mg total) by mouth daily.   nitroGLYCERIN (NITROSTAT) 0.4 MG SL tablet DISSOLVE ONE TABLET UNDER THE TONGUE EVERY 5 MINUTES AS NEEDED FOR CHEST PAIN.  DO NOT EXCEED A TOTAL OF 3 DOSES IN 15 MINUTES   pantoprazole (PROTONIX) 40 MG tablet TAKE 1 TABLET EVERY DAY BEFORE BREAKFAST    rosuvastatin (CRESTOR) 10 MG tablet Take 1 tablet (10 mg total) by mouth daily.   spironolactone (ALDACTONE) 25 MG tablet Take 0.5 tablets (12.5 mg total) by mouth daily.   No facility-administered encounter medications on file as of 01/12/2024.    Physical Exam: Blood pressure (!) 150/71, pulse 69, temperature (!) 97.5 F (36.4 C), height 5\' 10"  (1.778 m), weight 215 lb (97.5 kg), SpO2 98%. Gen:      No acute distress HEENT:  EOMI, sclera anicteric Neck:     No masses; no thyromegaly Lungs:    Clear to auscultation bilaterally; normal respiratory effort CV:         Regular rate and rhythm; no murmurs Abd:      + bowel sounds; soft, non-tender; no palpable masses, no distension Ext:    No edema; adequate peripheral perfusion Skin:      Warm and dry; no rash Neuro: alert and oriented x 3 Psych: normal mood and affect   Data Reviewed: Imaging: High-resolution CT 09/26/2020- basilar predominant fibrotic interstitial lung disease.  Probable UIP per ATS criteria  High resolution CT 12/02/2022-unchanged mild pulmonary fibrosis in probable UIP pattern. I have reviewed the images personally.  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 The condition is currently stable on recent CT scans. Asbestos exposure may be a contributing factor. Although the condition tends to worsen over time, there is no current change in size or severity. Therapy to slow progression was considered but deferred due to current stability and patient preference.  - Monitor with annual CT scans to assess stability. - Defer therapy to slow progression as  the condition is currently stable.  Pulmonary Hypertension (suspected) Suspected pulmonary hypertension due to artery enlargement, but he is asymptomatic and previous echocardiograms have been normal. No immediate concern is warranted. - No immediate action required; monitor for symptoms.  Aortic Dilatation Aortic dilatation measuring 4.2 cm, unchanged in size. Intervention is not required unless it reaches 5 cm or more. - Monitor with annual CT scans to ensure stability.  Coronary Artery Disease Coronary artery disease with previous stent placement and some plaque buildup. He is under the care of cardiologist Dr. Anne Fu. - Continue management under cardiologist Dr. Anne Fu.  Follow-up Scheduled for left leg surgery, necessitating recovery before further pulmonary testing.  - Schedule follow-up appointment in one year.   Plan/Recommendations: High-res CT, PFTs in 12 months  Chilton Greathouse MD [facility] 01/12/2024, 11:26 AM  CC: [facility]*

## 2024-01-13 ENCOUNTER — Encounter (HOSPITAL_COMMUNITY)
Admission: RE | Disposition: A | Discharge status: Home / Self Care | Payer: Self-pay | Source: Home / Self Care | Attending: Orthopedic Surgery

## 2024-01-13 ENCOUNTER — Ambulatory Visit: Admit: 2024-01-13 | Admitting: Orthopedic Surgery

## 2024-01-13 ENCOUNTER — Ambulatory Visit (HOSPITAL_BASED_OUTPATIENT_CLINIC_OR_DEPARTMENT_OTHER)
Admission: RE | Admit: 2024-01-13 | Discharge: 2024-01-13 | Disposition: A | Discharge status: Home / Self Care | Attending: Orthopedic Surgery | Admitting: Orthopedic Surgery

## 2024-01-13 ENCOUNTER — Encounter (HOSPITAL_BASED_OUTPATIENT_CLINIC_OR_DEPARTMENT_OTHER): Payer: Self-pay | Admitting: Orthopedic Surgery

## 2024-01-13 ENCOUNTER — Ambulatory Visit (HOSPITAL_BASED_OUTPATIENT_CLINIC_OR_DEPARTMENT_OTHER): Admitting: Anesthesiology

## 2024-01-13 ENCOUNTER — Ambulatory Visit (HOSPITAL_COMMUNITY)

## 2024-01-13 DIAGNOSIS — S82842A Displaced bimalleolar fracture of left lower leg, initial encounter for closed fracture: Secondary | ICD-10-CM

## 2024-01-13 DIAGNOSIS — Z955 Presence of coronary angioplasty implant and graft: Secondary | ICD-10-CM | POA: Insufficient documentation

## 2024-01-13 DIAGNOSIS — Z87891 Personal history of nicotine dependence: Secondary | ICD-10-CM | POA: Diagnosis not present

## 2024-01-13 DIAGNOSIS — I10 Essential (primary) hypertension: Secondary | ICD-10-CM | POA: Diagnosis not present

## 2024-01-13 DIAGNOSIS — W19XXXA Unspecified fall, initial encounter: Secondary | ICD-10-CM | POA: Diagnosis not present

## 2024-01-13 DIAGNOSIS — K219 Gastro-esophageal reflux disease without esophagitis: Secondary | ICD-10-CM | POA: Diagnosis not present

## 2024-01-13 DIAGNOSIS — I2510 Atherosclerotic heart disease of native coronary artery without angina pectoris: Secondary | ICD-10-CM | POA: Diagnosis not present

## 2024-01-13 DIAGNOSIS — Z7902 Long term (current) use of antithrombotics/antiplatelets: Secondary | ICD-10-CM | POA: Diagnosis not present

## 2024-01-13 HISTORY — PX: ORIF ANKLE FRACTURE: SHX5408

## 2024-01-13 HISTORY — PX: SYNDESMOSIS REPAIR: SHX5182

## 2024-01-13 SURGERY — OPEN REDUCTION INTERNAL FIXATION (ORIF) ANKLE FRACTURE
Anesthesia: Regional | Site: Ankle | Laterality: Left

## 2024-01-13 SURGERY — OPEN REDUCTION INTERNAL FIXATION (ORIF) ANKLE FRACTURE
Anesthesia: General | Site: Ankle | Laterality: Left

## 2024-01-13 MED ORDER — ONDANSETRON HCL 4 MG/2ML IJ SOLN
INTRAMUSCULAR | Status: DC | PRN
  Administered 2024-01-13: 4 mg via INTRAVENOUS

## 2024-01-13 MED ORDER — FENTANYL CITRATE (PF) 100 MCG/2ML IJ SOLN
INTRAMUSCULAR | Status: AC
  Filled 2024-01-13: qty 2

## 2024-01-13 MED ORDER — BUPIVACAINE LIPOSOME 1.3 % IJ SUSP
INTRAMUSCULAR | Status: DC | PRN
  Administered 2024-01-13: 10 mL via PERINEURAL

## 2024-01-13 MED ORDER — LIDOCAINE 2% (20 MG/ML) 5 ML SYRINGE
INTRAMUSCULAR | Status: DC | PRN
  Administered 2024-01-13: 40 mg via INTRAVENOUS

## 2024-01-13 MED ORDER — MIDAZOLAM HCL 2 MG/2ML IJ SOLN: 1.0000 mg | Freq: Once | INTRAMUSCULAR | Status: AC

## 2024-01-13 MED ORDER — VANCOMYCIN HCL 500 MG IV SOLR
INTRAVENOUS | Status: AC
  Filled 2024-01-13: qty 10

## 2024-01-13 MED ORDER — ROPIVACAINE HCL 5 MG/ML IJ SOLN
INTRAMUSCULAR | Status: DC | PRN
Start: 2024-01-13 — End: 2024-01-13
  Administered 2024-01-13: 30 mL via PERINEURAL

## 2024-01-13 MED ORDER — OXYCODONE HCL 5 MG PO TABS
5.0000 mg | ORAL_TABLET | Freq: Three times a day (TID) | ORAL | 0 refills | Status: AC | PRN
Start: 2024-01-13 — End: 2024-01-16

## 2024-01-13 MED ORDER — FENTANYL CITRATE (PF) 250 MCG/5ML IJ SOLN
INTRAMUSCULAR | Status: AC
  Filled 2024-01-13: qty 5

## 2024-01-13 MED ORDER — 0.9 % SODIUM CHLORIDE (POUR BTL) OPTIME
TOPICAL | Status: DC | PRN
  Administered 2024-01-13: 1000 mL

## 2024-01-13 MED ORDER — AMISULPRIDE (ANTIEMETIC) 5 MG/2ML IV SOLN: 10.0000 mg | Freq: Once | INTRAVENOUS | Status: DC | PRN

## 2024-01-13 MED ORDER — LACTATED RINGERS IV SOLN: INTRAVENOUS | Status: DC

## 2024-01-13 MED ORDER — OXYCODONE HCL 5 MG/5ML PO SOLN
5.0000 mg | Freq: Once | ORAL | Status: AC | PRN
  Administered 2024-01-13: 5 mg via ORAL

## 2024-01-13 MED ORDER — ACETAMINOPHEN 500 MG PO TABS
ORAL_TABLET | ORAL | Status: AC
  Filled 2024-01-13: qty 2

## 2024-01-13 MED ORDER — ONDANSETRON HCL 4 MG/2ML IJ SOLN: 4.0000 mg | Freq: Once | INTRAMUSCULAR | Status: DC | PRN

## 2024-01-13 MED ORDER — HYDROMORPHONE HCL 1 MG/ML IJ SOLN
0.2500 mg | INTRAMUSCULAR | Status: DC | PRN
  Administered 2024-01-13: 0.5 mg via INTRAVENOUS

## 2024-01-13 MED ORDER — PROPOFOL 10 MG/ML IV BOLUS
INTRAVENOUS | Status: AC
  Filled 2024-01-13: qty 20

## 2024-01-13 MED ORDER — OXYCODONE HCL 5 MG PO TABS: 5.0000 mg | ORAL_TABLET | Freq: Once | ORAL | Status: AC | PRN

## 2024-01-13 MED ORDER — MIDAZOLAM HCL 2 MG/2ML IJ SOLN
INTRAMUSCULAR | Status: AC
  Administered 2024-01-13: 1 mg via INTRAVENOUS
  Filled 2024-01-13: qty 2

## 2024-01-13 MED ORDER — PROPOFOL 10 MG/ML IV BOLUS
INTRAVENOUS | Status: DC | PRN
  Administered 2024-01-13: 120 mg via INTRAVENOUS

## 2024-01-13 MED ORDER — CEFAZOLIN SODIUM-DEXTROSE 2-4 GM/100ML-% IV SOLN
2.0000 g | INTRAVENOUS | Status: AC
  Administered 2024-01-13: 2 g via INTRAVENOUS

## 2024-01-13 MED ORDER — MIDAZOLAM HCL 2 MG/2ML IJ SOLN
INTRAMUSCULAR | Status: AC
  Filled 2024-01-13: qty 2

## 2024-01-13 MED ORDER — ACETAMINOPHEN 10 MG/ML IV SOLN
1000.0000 mg | Freq: Once | INTRAVENOUS | Status: AC
  Administered 2024-01-13: 1000 mg via INTRAVENOUS

## 2024-01-13 MED ORDER — FENTANYL CITRATE (PF) 100 MCG/2ML IJ SOLN
INTRAMUSCULAR | Status: AC
Start: 2024-01-13 — End: 2024-01-13
  Administered 2024-01-13: 50 ug via INTRAVENOUS
  Filled 2024-01-13: qty 2

## 2024-01-13 MED ORDER — VANCOMYCIN HCL 500 MG IV SOLR
INTRAVENOUS | Status: DC | PRN
  Administered 2024-01-13: 500 mg

## 2024-01-13 MED ORDER — VANCOMYCIN HCL 1000 MG IV SOLR
INTRAVENOUS | Status: AC
Start: 2024-01-13 — End: ?
  Filled 2024-01-13: qty 20

## 2024-01-13 MED ORDER — FENTANYL CITRATE (PF) 100 MCG/2ML IJ SOLN: 50.0000 ug | Freq: Once | INTRAMUSCULAR | Status: AC

## 2024-01-13 MED ORDER — PHENYLEPHRINE HCL-NACL 20-0.9 MG/250ML-% IV SOLN
INTRAVENOUS | Status: DC | PRN
  Administered 2024-01-13: 25 ug/min via INTRAVENOUS

## 2024-01-13 MED ORDER — OXYCODONE HCL 5 MG/5ML PO SOLN
ORAL | Status: DC
Start: 2024-01-13 — End: 2024-01-13
  Filled 2024-01-13: qty 5

## 2024-01-13 MED ORDER — HYDROMORPHONE HCL 1 MG/ML IJ SOLN
INTRAMUSCULAR | Status: AC
  Filled 2024-01-13: qty 1

## 2024-01-13 MED ORDER — CEFAZOLIN SODIUM-DEXTROSE 2-4 GM/100ML-% IV SOLN
INTRAVENOUS | Status: AC
  Filled 2024-01-13: qty 100

## 2024-01-13 MED ORDER — DEXAMETHASONE SODIUM PHOSPHATE 10 MG/ML IJ SOLN
INTRAMUSCULAR | Status: DC | PRN
  Administered 2024-01-13: 5 mg via INTRAVENOUS

## 2024-01-13 SURGICAL SUPPLY — 52 items
ALCOHOL 70% 16 OZ (MISCELLANEOUS) ×2 IMPLANT
BAG COUNTER SPONGE SURGICOUNT (BAG) ×2 IMPLANT
BANDAGE ESMARK 6X9 LF (GAUZE/BANDAGES/DRESSINGS) ×2 IMPLANT
BIT DRILL 2.5X2.75 QC CALB (BIT) IMPLANT
BIT DRILL 3.5X5.5 QC CALB (BIT) IMPLANT
BLADE SURG 15 STRL LF DISP TIS (BLADE) ×2 IMPLANT
BNDG COHESIVE 4X5 TAN STRL LF (GAUZE/BANDAGES/DRESSINGS) ×2 IMPLANT
BNDG COHESIVE 6X5 TAN ST LF (GAUZE/BANDAGES/DRESSINGS) ×2 IMPLANT
BNDG ELASTIC 4INX 5YD STR LF (GAUZE/BANDAGES/DRESSINGS) IMPLANT
BNDG ELASTIC 6INX 5YD STR LF (GAUZE/BANDAGES/DRESSINGS) IMPLANT
BNDG ESMARK 6X9 LF (GAUZE/BANDAGES/DRESSINGS) ×2 IMPLANT
CANISTER SUCT 3000ML PPV (MISCELLANEOUS) ×2 IMPLANT
CHLORAPREP W/TINT 26 (MISCELLANEOUS) ×4 IMPLANT
COVER SURGICAL LIGHT HANDLE (MISCELLANEOUS) ×2 IMPLANT
CUFF TOURN SGL QUICK 42 (TOURNIQUET CUFF) IMPLANT
CUFF TRNQT CYL 34X4.125X (TOURNIQUET CUFF) ×2 IMPLANT
DRAPE OEC MINIVIEW 54X84 (DRAPES) ×2 IMPLANT
DRAPE U-SHAPE 47X51 STRL (DRAPES) ×2 IMPLANT
DRSG MEPILEX POST OP 4X8 (GAUZE/BANDAGES/DRESSINGS) IMPLANT
DRSG MEPITEL 4X7.2 (GAUZE/BANDAGES/DRESSINGS) ×2 IMPLANT
ELECT REM PT RETURN 9FT ADLT (ELECTROSURGICAL) ×2 IMPLANT
ELECTRODE REM PT RTRN 9FT ADLT (ELECTROSURGICAL) ×2 IMPLANT
GAUZE PAD ABD 8X10 STRL (GAUZE/BANDAGES/DRESSINGS) ×4 IMPLANT
GAUZE SPONGE 4X4 12PLY STRL (GAUZE/BANDAGES/DRESSINGS) IMPLANT
GLOVE BIO SURGEON STRL SZ8 (GLOVE) ×2 IMPLANT
GLOVE BIOGEL PI IND STRL 8 (GLOVE) ×2 IMPLANT
GLOVE ECLIPSE 8.0 STRL XLNG CF (GLOVE) ×2 IMPLANT
GOWN STRL REUS W/ TWL LRG LVL3 (GOWN DISPOSABLE) ×2 IMPLANT
GOWN STRL REUS W/ TWL XL LVL3 (GOWN DISPOSABLE) ×4 IMPLANT
KIT BASIN OR (CUSTOM PROCEDURE TRAY) ×2 IMPLANT
KIT TURNOVER KIT B (KITS) ×2 IMPLANT
NS IRRIG 1000ML POUR BTL (IV SOLUTION) ×2 IMPLANT
PACK ORTHO EXTREMITY (CUSTOM PROCEDURE TRAY) ×2 IMPLANT
PAD ABD 8X10 STRL (GAUZE/BANDAGES/DRESSINGS) IMPLANT
PAD ARMBOARD POSITIONER FOAM (MISCELLANEOUS) ×4 IMPLANT
PAD CAST 4YDX4 CTTN HI CHSV (CAST SUPPLIES) ×2 IMPLANT
PAD CAST CTTN 4X4 STRL (SOFTGOODS) IMPLANT
PADDING CAST SYNTHETIC 6X4 NS (CAST SUPPLIES) IMPLANT
PLATE ACE 100DEG 7HOLE (Plate) IMPLANT
SCREW CORTICAL 3.5MM 16MM (Screw) IMPLANT
SCREW CORTICAL 3.5MM 18MM (Screw) IMPLANT
SCREW CORTICAL 3.5MM 20MM (Screw) IMPLANT
SCREW CORTICAL 3.5MM 30MM (Screw) IMPLANT
SPLINT PLASTER CAST FAST 5X30 (CAST SUPPLIES) IMPLANT
SPONGE T-LAP 18X18 ~~LOC~~+RFID (SPONGE) ×2 IMPLANT
SUCTION TUBE FRAZIER 10FR DISP (SUCTIONS) ×2 IMPLANT
SUT ETHILON 3 0 PS 1 (SUTURE) ×2 IMPLANT
SUT MNCRL AB 3-0 PS2 18 (SUTURE) IMPLANT
SUT VIC AB 2-0 CT1 TAPERPNT 27 (SUTURE) ×4 IMPLANT
TOWEL GREEN STERILE (TOWEL DISPOSABLE) ×2 IMPLANT
TOWEL GREEN STERILE FF (TOWEL DISPOSABLE) ×2 IMPLANT
TUBE CONNECTING 12X1/4 (SUCTIONS) ×2 IMPLANT

## 2024-01-13 SURGICAL SUPPLY — 46 items
BLADE SURG 15 STRL LF DISP TIS (BLADE) ×4 IMPLANT
BNDG ELASTIC 4INX 5YD STR LF (GAUZE/BANDAGES/DRESSINGS) ×2 IMPLANT
BNDG ELASTIC 6INX 5YD STR LF (GAUZE/BANDAGES/DRESSINGS) ×2 IMPLANT
BNDG ESMARK 4X9 LF (GAUZE/BANDAGES/DRESSINGS) IMPLANT
BNDG ESMARK 6X9 LF (GAUZE/BANDAGES/DRESSINGS) IMPLANT
CANISTER SUCT 1200ML W/VALVE (MISCELLANEOUS) ×2 IMPLANT
CHLORAPREP W/TINT 26 (MISCELLANEOUS) ×2 IMPLANT
COVER BACK TABLE 60X90IN (DRAPES) ×2 IMPLANT
CUFF TRNQT CYL 34X4.125X (TOURNIQUET CUFF) IMPLANT
DRAPE EXTREMITY T 121X128X90 (DISPOSABLE) ×2 IMPLANT
DRAPE OEC MINIVIEW 54X84 (DRAPES) ×2 IMPLANT
DRAPE U-SHAPE 47X51 STRL (DRAPES) ×2 IMPLANT
DRSG MEPITEL 4X7.2 (GAUZE/BANDAGES/DRESSINGS) ×2 IMPLANT
ELECT REM PT RETURN 9FT ADLT (ELECTROSURGICAL) ×2 IMPLANT
GAUZE PAD ABD 8X10 STRL (GAUZE/BANDAGES/DRESSINGS) ×4 IMPLANT
GAUZE SPONGE 4X4 12PLY STRL (GAUZE/BANDAGES/DRESSINGS) ×2 IMPLANT
GLOVE BIO SURGEON STRL SZ8 (GLOVE) ×2 IMPLANT
GLOVE BIOGEL PI IND STRL 8 (GLOVE) ×4 IMPLANT
GLOVE ECLIPSE 8.0 STRL XLNG CF (GLOVE) ×2 IMPLANT
GOWN STRL REUS W/ TWL LRG LVL3 (GOWN DISPOSABLE) ×2 IMPLANT
GOWN STRL REUS W/ TWL XL LVL3 (GOWN DISPOSABLE) ×4 IMPLANT
NEEDLE HYPO 22X1.5 SAFETY MO (MISCELLANEOUS) IMPLANT
NS IRRIG 1000ML POUR BTL (IV SOLUTION) ×2 IMPLANT
PACK BASIN DAY SURGERY FS (CUSTOM PROCEDURE TRAY) ×2 IMPLANT
PAD CAST 4YDX4 CTTN HI CHSV (CAST SUPPLIES) ×2 IMPLANT
PADDING CAST ABS COTTON 4X4 ST (CAST SUPPLIES) IMPLANT
PADDING CAST COTTON 6X4 STRL (CAST SUPPLIES) ×2 IMPLANT
PENCIL SMOKE EVACUATOR (MISCELLANEOUS) ×2 IMPLANT
SANITIZER HAND PURELL FF 515ML (MISCELLANEOUS) ×2 IMPLANT
SHEET MEDIUM DRAPE 40X70 STRL (DRAPES) ×2 IMPLANT
SLEEVE SCD COMPRESS KNEE MED (STOCKING) ×2 IMPLANT
SPIKE FLUID TRANSFER (MISCELLANEOUS) IMPLANT
SPLINT PLASTER CAST FAST 5X30 (CAST SUPPLIES) ×40 IMPLANT
SPONGE T-LAP 18X18 ~~LOC~~+RFID (SPONGE) ×2 IMPLANT
STOCKINETTE 6 STRL (DRAPES) ×2 IMPLANT
SUCTION TUBE FRAZIER 10FR DISP (SUCTIONS) ×2 IMPLANT
SUT ETHILON 3 0 PS 1 (SUTURE) ×2 IMPLANT
SUT FIBERWIRE #2 38 T-5 BLUE (SUTURE) IMPLANT
SUT MNCRL AB 3-0 PS2 18 (SUTURE) IMPLANT
SUT VIC AB 2-0 SH 27XBRD (SUTURE) ×2 IMPLANT
SUT VICRYL 0 SH 27 (SUTURE) IMPLANT
SYR BULB EAR ULCER 3OZ GRN STR (SYRINGE) ×2 IMPLANT
SYR CONTROL 10ML LL (SYRINGE) IMPLANT
TOWEL GREEN STERILE FF (TOWEL DISPOSABLE) ×4 IMPLANT
TUBE CONNECTING 20X1/4 (TUBING) ×2 IMPLANT
UNDERPAD 30X36 HEAVY ABSORB (UNDERPADS AND DIAPERS) ×2 IMPLANT

## 2024-01-13 NOTE — Discharge Instructions (Addendum)
 Toni Arthurs, MD [facility]  Please read the following information regarding your care after surgery.  Medications  You only need a prescription for the narcotic pain medicine (ex. oxycodone, Percocet, Norco).  All of the other medicines listed below are available over the counter. X Aleve 2 pills twice a day for the first 3 days after surgery. X acetominophen (Tylenol) 650 mg every 4-6 hours as you need for minor to moderate pain X oxycodone as prescribed for severe pain  Narcotic pain medicine (ex. oxycodone, Percocet, Vicodin) will cause constipation.  To prevent this problem, take the following medicines while you are taking any pain medicine. X docusate sodium (Colace) 100 mg twice a day X senna (Senokot) 2 tablets twice a day  X To help prevent blood clots, resume Plavix.  You should also get up every hour while you are awake to move around.    Weight Bearing X Do not bear any weight on the operated leg or foot.  Cast / Splint / Dressing X Keep your splint, cast or dressing clean and dry.  Don't put anything (coat hanger, pencil, etc) down inside of it.  If it gets damp, use a hair dryer on the cool setting to dry it.  If it gets soaked, call the office to schedule an appointment for a cast change.  After your dressing, cast or splint is removed; you may shower, but do not soak or scrub the wound.  Allow the water to run over it, and then gently pat it dry.  Swelling It is normal for you to have swelling where you had surgery.  To reduce swelling and pain, keep your toes above your nose for at least 3 days after surgery.  It may be necessary to keep your foot or leg elevated for several weeks.  If it hurts, it should be elevated.  Follow Up Call my office at [phone] when you are discharged from the hospital or surgery center to schedule an appointment to be seen two weeks after surgery.  Call my office at [phone] if you develop a fever >101.5 F, nausea, vomiting,  bleeding from the surgical site or severe pain.

## 2024-01-13 NOTE — Op Note (Addendum)
 01/13/2024  3:01 PM  PATIENT:  Henry [name]  73 y.o. male  PRE-OPERATIVE DIAGNOSIS:  Closed bimalleolar fracture of left ankle  POST-OPERATIVE DIAGNOSIS:  Closed bimalleolar fracture of left ankle  Procedure(s): 1.  Open treatment of left ankle bimalleolar fracture with internal fixation 2.  Stress examination of the left ankle under fluoroscopy 3.  AP, lateral and mortise radiographs of the left ankle  SURGEON:  Toni Arthurs, MD  ASSISTANT: None  ANESTHESIA:   General, regional  EBL:  minimal   TOURNIQUET:   Total Tourniquet Time Documented: Thigh (Left) - 28 minutes Total: Thigh (Left) - 28 minutes  COMPLICATIONS:  None apparent  DISPOSITION:  Extubated, awake and stable to recovery.  INDICATION FOR PROCEDURE: 73 year old male with a past medical history significant for coronary artery disease sustained a left ankle fracture that is displaced.  He presents now for operative treatment of this displaced and unstable left ankle injury.  The risks and benefits of the alternative treatment options have been discussed in detail.  The patient wishes to proceed with surgery and specifically understands risks of bleeding, infection, nerve damage, blood clots, need for additional surgery, amputation and death.   PROCEDURE IN DETAIL:  After pre operative consent was obtained, and the correct operative site was identified, the patient was brought to the operating room and placed supine on the OR table.  Anesthesia was administered.  Pre-operative antibiotics were administered.  A surgical timeout was taken.  The left lower extremity was prepped and draped in standard sterile fashion with a tourniquet around the thigh.  The extremity was elevated, and the tourniquet was inflated to 250 mmHg.  A longitudinal incision was made over the lateral malleolus.  Dissection was carried sharply down through the subcutaneous tissues.  The fracture site was identified.  It was cleaned of all hematoma and  intervening periosteum.  The fracture was reduced and held with a lobster claw and a pointed tenaculum.  A 3.5 mm lag screw was inserted from anterior to posterior across the fracture site.  It was noted to have adequate purchase.  A 7 hole one third tubular plate from the [employer] small frag set was selected.  It was contoured to fit the lateral malleolus.  It was secured proximally with 3 bicortical screws and distally with 3 unicortical screws.  AP, mortise and lateral radiographs confirmed appropriate reduction of the lateral malleolus fracture and appropriate position and length of all screws.  The medial malleolus avulsion fracture was well reduced and too small for fixation.  Stress examination of the left ankle was performed.  A mortise view was obtained.  Dorsiflexion and external rotation stress was applied to the supinated forefoot.  There was no instability evident at the syndesmosis.  The wound was then irrigated copiously and sprinkled with vancomycin powder.  Subcutaneous tissues were approximated with inverted simple sutures of 2-0 Vicryl.  The skin incision was closed with running 3-0 nylon.  Sterile dressings were applied followed by a well-padded short leg splint.  The tourniquet was released after application of the dressings.  The patient was awakened from anesthesia and transported to the recovery room in stable condition.   FOLLOW UP PLAN: Nonweightbearing on the left lower extremity.  Follow-up in the office in 2 weeks for suture removal and conversion to a cam boot for early weightbearing and range of motion.  The patient is on Plavix at baseline, so we will hold additional blood thinners.  RADIOGRAPHS: AP, mortise and lateral  radiographs of the left ankle are obtained intraoperatively.  These show interval reduction and fixation of the lateral malleolus fracture.  Hardware is appropriately positioned and of the appropriate lengths.  The medial malleolus avulsion fracture is well  reduced.  No other acute injuries are noted.

## 2024-01-13 NOTE — H&P (Signed)
 Henry [name] is an 73 y.o. male.   Chief Complaint: Left ankle pain HPI: 73 year old male with a past medical history significant for CAD fell injuring his left ankle.  He has a displaced bimalleolar fracture and presents now for operative treatment.  Past Medical History:  Diagnosis Date   Anemia    low iron   BPH (benign prostatic hypertrophy)    CAD (coronary artery disease), native coronary artery    a. LHC 07/2019: 90% mLAD s/p atherectomyDES, 75% pLCX, 40% LM, 25% pLAD, 50% 2nd Diag. LVEF 35-45. Normal LVEDP   Dyspnea    "a little bit" with exertion   Frequency of urination    GERD (gastroesophageal reflux disease)    occasionally uses tums or 1 tbsp vinager   History of bladder stone    History of kidney stones    Hypertension    Ischemic cardiomyopathy     a. Echo 08/2019: LVEF 35-40%, global hypokinesis, G1DD   Nocturia    Pneumonia    Status post insertion of drug-eluting stent into left anterior descending (LAD) artery for coronary artery disease 08/11/2019   Wears glasses     Past Surgical History:  Procedure Laterality Date   CORONARY ATHERECTOMY N/A 08/11/2019   Procedure: CORONARY ATHERECTOMY;  Surgeon: Corky Crafts, MD;  Location: [facility] INVASIVE CV LAB;  Service: Cardiovascular;  Laterality: N/A;   CORONARY STENT INTERVENTION N/A 09/04/2019   Procedure: CORONARY STENT INTERVENTION;  Surgeon: [place], Peter M, MD;  Location: [facility] INVASIVE CV LAB;  Service: Cardiovascular;  Laterality: N/A;   CORONARY ULTRASOUND/IVUS N/A 08/11/2019   Procedure: Intravascular Ultrasound/IVUS;  Surgeon: Corky Crafts, MD;  Location: [facility] INVASIVE CV LAB;  Service: Cardiovascular;  Laterality: N/A;   CYSTO/  LEFT URETEROSCOPIC STONE EXTRACTION/  BLADDER  STONE EXTRACTION  02-15-2009   CYSTOSCOPY WITH LITHOLAPAXY N/A 09/29/2013   Procedure: CYSTOSCOPY WITH LITHOLAPAXY;  Surgeon: Marcine Matar, MD;  Location: [facility];  Service: Urology;  Laterality: N/A;    LEFT HEART CATH AND CORONARY ANGIOGRAPHY N/A 08/11/2019   Procedure: LEFT HEART CATH AND CORONARY ANGIOGRAPHY;  Surgeon: Corky Crafts, MD;  Location: [facility] INVASIVE CV LAB;  Service: Cardiovascular;  Laterality: N/A;   LUMBAR DISC SURGERY  09-19-2000   LEFT  L4 -- L5   REPAIR RIGHT HAND SOFT TISSUE STRUCTURES  02-24-2000   WORK INJURY /  PARTIAL AMPUTATION RIGHT INDEX FINGER   TRANSURETHRAL RESECTION OF PROSTATE N/A 09/29/2013   Procedure: TRANSURETHRAL RESECTION OF THE PROSTATE WITH GYRUS INSTRUMENTS;  Surgeon: Marcine Matar, MD;  Location: [facility];  Service: Urology;  Laterality: N/A;   VIDEO BRONCHOSCOPY WITH ENDOBRONCHIAL ULTRASOUND N/A 05/16/2019   Procedure: VIDEO BRONCHOSCOPY WITH ENDOBRONCHIAL ULTRASOUND;  Surgeon: Josephine Igo, DO;  Location: [facility] OR;  Service: Thoracic;  Laterality: N/A;    Family History  Problem Relation Age of Onset   Hypertension Mother    Social History:  reports that he quit smoking about 40 years ago. His smoking use included cigarettes. He started smoking about 60 years ago. He has a 20 pack-year smoking history. He has never used smokeless tobacco. He reports current alcohol use. He reports that he does not use drugs.  Allergies: No Known Allergies  Medications Prior to Admission  Medication Sig Dispense Refill   acetaminophen (TYLENOL) 325 MG tablet Take 650 mg by mouth every 6 (six) hours as needed (FOR PAIN).     Ascorbic Acid (VITAMIN C) 1000 MG tablet Take 1,000 mg  by mouth daily.     isosorbide mononitrate (IMDUR) 30 MG 24 hr tablet Take 1 tablet (30 mg total) by mouth daily. 90 tablet 3   losartan (COZAAR) 100 MG tablet Take 1 tablet (100 mg total) by mouth daily. 90 tablet 3   metoprolol succinate (TOPROL-XL) 50 MG 24 hr tablet Take 1 tablet (50 mg total) by mouth daily. 90 tablet 3   pantoprazole (PROTONIX) 40 MG tablet TAKE 1 TABLET EVERY DAY BEFORE BREAKFAST 30 tablet 0   rosuvastatin (CRESTOR) 10 MG tablet Take  1 tablet (10 mg total) by mouth daily. 90 tablet 3   spironolactone (ALDACTONE) 25 MG tablet Take 0.5 tablets (12.5 mg total) by mouth daily. 45 tablet 3   clopidogrel (PLAVIX) 75 MG tablet Take 1 tablet (75 mg total) by mouth daily. 90 tablet 3   nitroGLYCERIN (NITROSTAT) 0.4 MG SL tablet DISSOLVE ONE TABLET UNDER THE TONGUE EVERY 5 MINUTES AS NEEDED FOR CHEST PAIN.  DO NOT EXCEED A TOTAL OF 3 DOSES IN 15 MINUTES 25 tablet 4    No results found for this or any previous visit (from the past 48 hours). No results found.  Review of Systems no recent fever, chills, nausea, vomiting or changes in his appetite  Height 5\' 10"  (1.778 m), weight 95.3 kg. Physical Exam  Well-nourished well-developed man in no apparent distress.  Alert and oriented.  Normal mood and affect.  Gait is nonweightbearing on the left.  Left ankle has some swelling.  Skin is healthy and intact.  No lymphadenopathy.  Pulses are palpable in the foot.  Active plantarflexion and dorsiflexion strength at the toes.   Assessment/Plan Left ankle bimalleolar fracture --to the operating room today for open treatment with internal fixation.  The risks and benefits of the alternative treatment options have been discussed in detail.  The patient wishes to proceed with surgery and specifically understands risks of bleeding, infection, nerve damage, blood clots, need for additional surgery, amputation and death.   Toni Arthurs, MD Jan 31, 2024, 11:57 AM

## 2024-01-13 NOTE — Progress Notes (Signed)
 Witnessed Marissa, RN wasting Versed 1mg  in the narcotic bin.

## 2024-01-13 NOTE — Anesthesia Postprocedure Evaluation (Signed)
 Anesthesia Post Note  Patient: Henry [name]  Procedure(s) Performed: OPEN REDUCTION INTERNAL FIXATION (ORIF) ANKLE FRACTURE (Left: Ankle) REPAIR, SYNDESMOSIS, ANKLE (Left) OPEN REDUCTION INTERNAL FIXATION (ORIF) ANKLE FRACTURE (Left: Ankle) REPAIR, SYNDESMOSIS, ANKLE (Left)     Patient location during evaluation: PACU Anesthesia Type: Regional and General Level of consciousness: awake and alert Pain management: pain level controlled Vital Signs Assessment: post-procedure vital signs reviewed and stable Respiratory status: spontaneous breathing, nonlabored ventilation and respiratory function stable Cardiovascular status: blood pressure returned to baseline and stable Postop Assessment: no apparent nausea or vomiting Anesthetic complications: no   No notable events documented.  Last Vitals:  Vitals:   01/13/24 1542 01/13/24 1545  BP: (!) 144/71   Pulse: 69 70  Resp: 14 20  Temp: 36.7 C   SpO2: 97% 98%    Last Pain:  Vitals:   01/13/24 1530  TempSrc:   PainSc: 3                  Keiara Sneeringer,W. EDMOND

## 2024-01-13 NOTE — Transfer of Care (Signed)
 Immediate Anesthesia Transfer of Care Note  Patient: Henry [name]  Procedure(s) Performed: OPEN REDUCTION INTERNAL FIXATION (ORIF) ANKLE FRACTURE (Left: Ankle) REPAIR, SYNDESMOSIS, ANKLE (Left) OPEN REDUCTION INTERNAL FIXATION (ORIF) ANKLE FRACTURE (Left: Ankle) REPAIR, SYNDESMOSIS, ANKLE (Left)  Patient Location: PACU  Anesthesia Type:General  Level of Consciousness: awake, alert , and oriented  Airway & Oxygen Therapy: Patient Spontanous Breathing  Post-op Assessment: Report given to RN  Post vital signs: Reviewed and stable  Last Vitals:  Vitals Value Taken Time  BP 150/76 01/13/24 1505  Temp    Pulse 70 01/13/24 1507  Resp 11 01/13/24 1507  SpO2 100 % 01/13/24 1507  Vitals shown include unfiled device data.  Last Pain:  Vitals:   01/13/24 1310  TempSrc:   PainSc: 4          Complications: No notable events documented.

## 2024-01-13 NOTE — Progress Notes (Signed)
 Patient presented to [facility] for scheduled ORIF left ankle. On preoperative evaluation it was found that patient has had recent angina with minimal exertion for which he has taken sublingual nitroglycerin. The last time this occurred was on Monday of this week. Before that he states it had been about a month prior. He last saw his cardiologist on Friday last week but there is no mention of recent chest pain in his note/preoperative cardiac risk assessment. Although his symptoms have completely resolved since Monday, his cardiopulmonary history combined with recent angina make him not an acceptable candidate for the outpatient surgical center. I have arranged for transfer to [facility] for further care this afternoon for which the patient and his wife are very understanding.  Rollene Rotunda, MD, MPH Anesthesiology

## 2024-01-13 NOTE — Progress Notes (Signed)
 Versed 1mg  wasted witnessed by Launa Flight, RN.

## 2024-01-13 NOTE — Anesthesia Preprocedure Evaluation (Addendum)
 Anesthesia Evaluation  Patient identified by MRN, date of birth, ID band Patient awake    Reviewed: Allergy & Precautions, H&P , NPO status , Patient's Chart, lab work & pertinent test results, reviewed documented beta blocker date and time   Airway Mallampati: III  TM Distance: >3 FB Neck ROM: Full    Dental  (+) Teeth Intact, Dental Advisory Given   Pulmonary former smoker Snores at night, has never had sleep study   hilar mass and lung fibrosis, which are being monitored by pulmonary specialists.  20 pack year history    Pulmonary exam normal breath sounds clear to auscultation       Cardiovascular hypertension (137/73 preop), Pt. on medications and Pt. on home beta blockers + CAD and + Cardiac Stents (2 stents 2020 LCX)  Normal cardiovascular exam Rhythm:Regular Rate:Normal  last cardiac catheterization was in 2020, during which two stents were placed in the circumflex artery. An echocardiogram in 2021 showed an ejection fraction of 40-45%  Has been off plavix x 5d  Known intermittent LBBB, last saw cardiology a few days ago   Neuro/Psych negative neurological ROS  negative psych ROS   GI/Hepatic Neg liver ROS,GERD  Medicated and Controlled,,  Endo/Other  negative endocrine ROS    Renal/GU negative Renal ROS  negative genitourinary   Musculoskeletal negative musculoskeletal ROS (+)    Abdominal   Peds negative pediatric ROS (+)  Hematology negative hematology ROS (+)   Anesthesia Other Findings   Reproductive/Obstetrics negative OB ROS                              Anesthesia Physical Anesthesia Plan  ASA: 4  Anesthesia Plan: General and Regional   Post-op Pain Management: Tylenol PO (pre-op)* and Regional block*   Induction: Intravenous  PONV Risk Score and Plan: 2 and Ondansetron, Dexamethasone, Treatment may vary due to age or medical condition and Midazolam  Airway  Management Planned: LMA  Additional Equipment: None  Intra-op Plan:   Post-operative Plan: Extubation in OR  Informed Consent: I have reviewed the patients History and Physical, chart, labs and discussed the procedure including the risks, benefits and alternatives for the proposed anesthesia with the patient or authorized representative who has indicated his/her understanding and acceptance.     Dental advisory given  Plan Discussed with: CRNA  Anesthesia Plan Comments: (Sent from [facility] Preop for hx of recent NTG use for chest pain (3d ago); prior to this, per pt has been "a while" since last using NTG for chest pain. When he uses it, it helps his chest pain almost immediately. Was seen by cardiology a few days ago for surgical clearance, but no mention of chest pain symptoms and NTG use.  In my opinion, he likely warrants repeat stress testing, however surgery is urgent so we will proceed. I discussed with the patient that he is high risk for perioperative cardiac complications, including MI and stroke. I recommended to him calling his cardiologist postop to report his symptoms and potentially have stress testing done.   )         Anesthesia Quick Evaluation

## 2024-01-13 NOTE — Anesthesia Procedure Notes (Signed)
 Anesthesia Regional Block: Adductor canal block   Pre-Anesthetic Checklist: , timeout performed,  Correct Patient, Correct Site, Correct Laterality,  Correct Procedure, Correct Position, site marked,  Risks and benefits discussed,  Surgical consent,  Pre-op evaluation,  At surgeon's request and post-op pain management  Laterality: Left  Prep: Maximum Sterile Barrier Precautions used, chloraprep       Needles:  Injection technique: Single-shot  Needle Type: Echogenic Stimulator Needle     Needle Length: 9cm  Needle Gauge: 22     Additional Needles:   Procedures:,,,, ultrasound used (permanent image in chart),,    Narrative:  Start time: 01/13/2024 1:20 PM End time: 01/13/2024 1:25 PM Injection made incrementally with aspirations every 5 mL.  Performed by: Personally  Anesthesiologist: Lannie Fields, DO  Additional Notes: Monitors applied. No increased pain on injection. No increased resistance to injection. Injection made in 5cc increments. Good needle visualization. Patient tolerated procedure well.

## 2024-01-13 NOTE — Anesthesia Procedure Notes (Signed)
 Anesthesia Regional Block: Popliteal block   Pre-Anesthetic Checklist: , timeout performed,  Correct Patient, Correct Site, Correct Laterality,  Correct Procedure, Correct Position, site marked,  Risks and benefits discussed,  Surgical consent,  Pre-op evaluation,  At surgeon's request and post-op pain management  Laterality: Left  Prep: Maximum Sterile Barrier Precautions used, chloraprep       Needles:  Injection technique: Single-shot  Needle Type: Echogenic Stimulator Needle     Needle Length: 9cm  Needle Gauge: 22     Additional Needles:   Procedures:,,,, ultrasound used (permanent image in chart),,    Narrative:  Start time: 01/13/2024 1:15 PM End time: 01/13/2024 1:20 PM Injection made incrementally with aspirations every 5 mL.  Performed by: Personally  Anesthesiologist: Lannie Fields, DO  Additional Notes: Monitors applied. No increased pain on injection. No increased resistance to injection. Injection made in 5cc increments. Good needle visualization. Patient tolerated procedure well.

## 2024-01-14 ENCOUNTER — Encounter (HOSPITAL_COMMUNITY): Payer: Self-pay | Admitting: Orthopedic Surgery

## 2024-01-18 ENCOUNTER — Encounter (HOSPITAL_COMMUNITY): Payer: Self-pay | Admitting: Orthopedic Surgery

## 2024-03-23 ENCOUNTER — Telehealth: Payer: Self-pay | Admitting: Cardiology

## 2024-03-23 MED ORDER — PANTOPRAZOLE SODIUM 40 MG PO TBEC: DELAYED_RELEASE_TABLET | ORAL | 2 refills | Status: DC

## 2024-03-23 NOTE — Telephone Encounter (Signed)
 RX sent to requested Pharmacy

## 2024-03-23 NOTE — Telephone Encounter (Signed)
*  STAT* If patient is at the pharmacy, call can be transferred to refill team.   1. Which medications need to be refilled? (please list name of each medication and dose if known)   pantoprazole  (PROTONIX ) 40 MG tablet    2. Which pharmacy/location (including street and city if local pharmacy) is medication to be sent to? [id number] - [place] ([place]), [place] - [address]   3. Do they need a 30 day or 90 day supply? 90

## 2024-03-24 ENCOUNTER — Other Ambulatory Visit: Payer: Self-pay

## 2024-03-24 MED ORDER — PANTOPRAZOLE SODIUM 40 MG PO TBEC: DELAYED_RELEASE_TABLET | ORAL | 2 refills | Status: AC

## 2024-05-15 ENCOUNTER — Ambulatory Visit (INDEPENDENT_AMBULATORY_CARE_PROVIDER_SITE_OTHER): Admitting: Cardiology

## 2024-05-15 ENCOUNTER — Encounter (HOSPITAL_BASED_OUTPATIENT_CLINIC_OR_DEPARTMENT_OTHER): Payer: Self-pay | Admitting: Cardiology

## 2024-05-15 VITALS — BP 100/60 | HR 70 | Ht 71.0 in | Wt 215.0 lb

## 2024-05-15 DIAGNOSIS — I10 Essential (primary) hypertension: Secondary | ICD-10-CM

## 2024-05-15 DIAGNOSIS — I2510 Atherosclerotic heart disease of native coronary artery without angina pectoris: Secondary | ICD-10-CM

## 2024-05-15 DIAGNOSIS — I5022 Chronic systolic (congestive) heart failure: Secondary | ICD-10-CM

## 2024-05-15 MED ORDER — LOSARTAN POTASSIUM 25 MG PO TABS: 25.0000 mg | ORAL_TABLET | Freq: Every day | ORAL | 3 refills | Status: AC

## 2024-05-15 NOTE — Patient Instructions (Signed)
 Medication Instructions:  Please discontinue your Isosorbide  and decrease your Losartan  to 25 mg a day. Continue all other medications as listed.  *If you need a refill on your cardiac medications before your next appointment, please call your pharmacy*  Follow-Up: At [facility], you and your health needs are our priority.  As part of our continuing mission to provide you with exceptional heart care, our providers are all part of one team.  This team includes your primary Cardiologist (physician) and Advanced Practice Providers or APPs (Physician Assistants and Nurse Practitioners) who all work together to provide you with the care you need, when you need it.  Your next appointment:   1-2  month(s)  Provider:   One of our Advanced Practice Providers (APPs): Morse Clause, PA-C  Lamarr Satterfield, NP Miriam Shams, NP  Olivia Pavy, PA-C Josefa Beauvais, NP  Leontine Salen, PA-C Orren Fabry, PA-C  [place], PA-C Ernest Dick, NP  Damien Braver, NP Jon Hails, PA-C  Waddell Donath, PA-C    Dayna Dunn, PA-C  Scott Weaver, PA-C Lum Louis, NP Katlyn West, NP Callie Goodrich, PA-C  Evan Williams, PA-C Sheng Haley, PA-C  Xika Zhao, NP Kathleen Johnson, PA-C       We recommend signing up for the patient portal called MyChart.  Sign up information is provided on this After Visit Summary.  MyChart is used to connect with patients for Virtual Visits (Telemedicine).  Patients are able to view lab/test results, encounter notes, upcoming appointments, etc.  Non-urgent messages can be sent to your provider as well.   To learn more about what you can do with MyChart, go to [email or web address].

## 2024-05-15 NOTE — Progress Notes (Signed)
 Cardiology Office Note:  .   Date:  05/15/2024  ID:  Henry [name], DOB 06/09/1951, MRN [id number] PCP: [facility]  [facility] Providers Cardiologist:  Oneil Parchment, MD     History of Present Illness: .   Henry [name] is a 73 y.o. male Discussed the use of AI scribe software for clinical note transcription with the patient, who gave verbal consent to proceed.  History of Present Illness Henry [name] is a 73 year old male with coronary artery disease and chronic systolic heart failure who presents for follow-up.  Exertional fatigue and hypotension - Significant fatigue and exhaustion with minimal exertion - Last week, experienced feeling very unwell and blurry vision while shoveling dirt in hot weather; symptoms improved after resting - Blood pressure recorded at 100/60 mmHg, which is lower than usual for him  Angina - No frequent chest pain - Occasional chest pain only with overexertion  Paresthesia and neuromuscular symptoms - Numbness and tingling in the left hand and arm, especially when driving or sleeping with the arm elevated - Right arm affected to a lesser extent - Symptoms alleviated by repositioning the arm - Muscle pain in the neck area, possibly contributing to symptoms  Heart failure and medication access - Chronic systolic heart failure with ejection fraction of 40-45% (noted in 2021) - Unable to afford Entresto  and SGLT2 inhibitors - Current medications: isosorbide  30 mg daily, losartan  100 mg daily, metoprolol  50 mg daily, spironolactone  12.5 mg daily, Crestor  10 mg daily  Coronary artery disease - History of circumflex stent placement in 2020  Interstitial lung disease - Interstitial lung disease under care of pulmonologist  Musculoskeletal injury - History of left ankle fracture noted at last visit      ROS: No syncope, no CP  Studies Reviewed: .        Results   Risk Assessment/Calculations:            Physical  Exam:   VS:  BP 100/60 (BP Location: Left Arm, Patient Position: Sitting, Cuff Size: Large)   Pulse 70   Ht 5' 11 (1.803 m)   Wt 215 lb (97.5 kg)   SpO2 98%   BMI 29.99 kg/m    Wt Readings from Last 3 Encounters:  05/15/24 215 lb (97.5 kg)  01/13/24 215 lb 6.2 oz (97.7 kg)  01/12/24 215 lb (97.5 kg)    GEN: Well nourished, well developed in no acute distress NECK: No JVD; No carotid bruits CARDIAC: RRR, no murmurs, no rubs, no gallops RESPIRATORY:  Clear to auscultation without rales, wheezing or rhonchi  ABDOMEN: Soft, non-tender, non-distended EXTREMITIES:  No edema; No deformity   ASSESSMENT AND PLAN: .    Assessment and Plan Assessment & Plan Coronary artery disease status post LAD and circumflex stenting Coronary artery disease with LAD and circumflex stenting. Currently on rosuvastatin  10 mg daily for cholesterol management and plaque stabilization. No current chest pain, only occasional discomfort with overexertion. - Continue rosuvastatin  10 mg daily - Consider further testing if symptoms do not improve - Will stop Imdur  30 due to hypotension symptoms.   Chronic systolic heart failure with medication intolerance and suboptimal therapy Chronic systolic heart failure with prior ejection fraction of 40-45% in 2021. Unable to afford Entresto  and SGLT2 inhibitors. Current regimen includes losartan , metoprolol , and spironolactone . Symptoms include fatigue and exercise intolerance, possibly exacerbated by medication-induced hypotension. Decision made to adjust medications to improve symptoms and manage blood pressure. - Reduce losartan  to  25 mg daily from 100 mg - Discontinue isosorbide  30 - Continue Toprol  50 - Continue spironolactone  12.5 - Adjust medications as needed  Hypotension due to antihypertensive therapy Hypotension likely secondary to current antihypertensive regimen. Blood pressure at 100/60 mmHg, low for him. Symptoms include feeling washed out, especially in  heat, and possible dehydration. Plan to adjust medications to alleviate symptoms and prevent further episodes. - Reduce losartan  to 25 mg daily from 100 mg - Discontinue isosorbide  - Monitor blood pressure and symptoms  Fatigue and exercise intolerance Fatigue and exercise intolerance likely related to hypotension and medication regimen. Symptoms include exhaustion with minimal exertion and an episode of blurred vision and feeling unwell while working in the heat. Adjusting medications to improve blood pressure and reduce fatigue. - Reduce losartan  to 25 mg daily from 100 mg - Discontinue isosorbide  - Consider further evaluation if no improvement  Arm paresthesias -Consider neurology evaluation.  Could be cervical stenosis.  Cervical radiculopathy.         Dispo: 1-2 month PA  Signed, Oneil Parchment, MD

## 2024-06-15 ENCOUNTER — Ambulatory Visit: Admitting: Emergency Medicine

## 2024-07-11 NOTE — Progress Notes (Addendum)
 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   - Target refraction: -2.00/-2.50 (currently takes glasses off to read and wants to maintain)  - Astigmatism Management: Possible toric OS-- pt to decide based on finances 08/07/24 addendum: Pt opts for toric - Femto: no - Glaucoma/MIGS: no - Case Posted: yes - Meds ordered: Yes (OSRX) - E-consent signed:  both eyes - Lens Selection OD: [place] - Lens Selection OS: [place] or clareon toric - Patient Post Refractive: no - Special Needs:  - ECOR: no  - Discussed risks, benefits, alternatives to cataract extraction and patient expresses understanding of this and wishes to Patient would like to proceed with surgery on left eye then right eye.  -Exam evidence of cataract that is causing symptomatic decreased visual acuity that is not improved to significant level with refraction and patients activites of daily living are significantly affected by this vision decrease. It is expected that cataract extraction will improve vision in the affected eye(s) and the daily functioning of the patient as there are no other exam findings to associate with visual symptoms. The IOL measurements were reviewed and I discussed IOL choices with the patient. he understands that there is no guarantee of refractive outcome with selected IOL.  - Pt can tolerate MAC/topical anesthesia and lay supine for 10 minutes.  - The risks of cataract surgery were discussed and include, but are not limited to endophthalmitis, retina detachment, posterior capsule rupture, bleeding, endothelial cell loss, macular edema, corneal edema. These risks could require other surgeries and could lead to vision loss and even loss of the eye. These risks were discussed with the patient who understands them and signed a written consent form.   I discussed the above diagnoses listed in  the Impression and Plan with the patient/parent(s). I personally performed and evaluated this patient as documented by the resident/fellow/technician entries and reconciled when needed. The Chief Complaint and HPI as documented was explored in detail and no additional concerns were reported. I have reviewed past medical history, family history, social history, review of symptoms, medications and allergies as documented in the patient's electronic medical record and agree or have revised as indicated. I performed medication reconciliation if medications were prescribed or dosages adjusted. I developed the management plan and counseled the patient/parents on treatment options as outlined above.  Lloyd B. Trudy, MD, PhD

## 2024-10-21 ENCOUNTER — Other Ambulatory Visit: Payer: Self-pay | Admitting: Cardiology
